# Patient Record
Sex: Female | Born: 1950
Health system: Southern US, Community
[De-identification: ages and names within clinical notes are randomized; demographics above are authoritative.]

## PROBLEM LIST (undated history)

## (undated) DIAGNOSIS — E785 Hyperlipidemia, unspecified: Secondary | ICD-10-CM

## (undated) DIAGNOSIS — I10 Essential (primary) hypertension: Secondary | ICD-10-CM

## (undated) DIAGNOSIS — T7840XA Allergy, unspecified, initial encounter: Secondary | ICD-10-CM

## (undated) DIAGNOSIS — M1990 Unspecified osteoarthritis, unspecified site: Secondary | ICD-10-CM

## (undated) DIAGNOSIS — K219 Gastro-esophageal reflux disease without esophagitis: Secondary | ICD-10-CM

## (undated) HISTORY — DX: Essential (primary) hypertension: I10

## (undated) HISTORY — PX: JOINT REPLACEMENT: SHX530

## (undated) HISTORY — DX: Hyperlipidemia, unspecified: E78.5

## (undated) HISTORY — DX: Gastro-esophageal reflux disease without esophagitis: K21.9

## (undated) HISTORY — DX: Unspecified osteoarthritis, unspecified site: M19.90

## (undated) HISTORY — DX: Allergy, unspecified, initial encounter: T78.40XA

---

## 1997-10-11 ENCOUNTER — Ambulatory Visit (HOSPITAL_COMMUNITY): Admission: RE | Admit: 1997-10-11 | Discharge: 1997-10-11 | Payer: Self-pay | Admitting: *Deleted

## 1998-05-03 ENCOUNTER — Ambulatory Visit (HOSPITAL_COMMUNITY): Admission: RE | Admit: 1998-05-03 | Discharge: 1998-05-03 | Payer: Self-pay | Admitting: Orthopedic Surgery

## 1998-05-10 ENCOUNTER — Ambulatory Visit (HOSPITAL_COMMUNITY): Admission: RE | Admit: 1998-05-10 | Discharge: 1998-05-10 | Payer: Self-pay | Admitting: Orthopedic Surgery

## 1998-05-10 ENCOUNTER — Encounter: Payer: Self-pay | Admitting: Orthopedic Surgery

## 1999-03-14 LAB — HM COLONOSCOPY: HM Colonoscopy: NORMAL

## 2002-08-17 ENCOUNTER — Other Ambulatory Visit: Admission: RE | Admit: 2002-08-17 | Discharge: 2002-08-17 | Payer: Self-pay | Admitting: Family Medicine

## 2002-08-23 ENCOUNTER — Encounter: Admission: RE | Admit: 2002-08-23 | Discharge: 2002-08-23 | Payer: Self-pay | Admitting: Family Medicine

## 2002-08-23 ENCOUNTER — Encounter: Payer: Self-pay | Admitting: Family Medicine

## 2003-08-24 ENCOUNTER — Other Ambulatory Visit: Admission: RE | Admit: 2003-08-24 | Discharge: 2003-08-24 | Payer: Self-pay | Admitting: Family Medicine

## 2003-08-25 ENCOUNTER — Other Ambulatory Visit: Admission: RE | Admit: 2003-08-25 | Discharge: 2003-08-25 | Payer: Self-pay | Admitting: Obstetrics and Gynecology

## 2003-08-30 ENCOUNTER — Ambulatory Visit (HOSPITAL_COMMUNITY): Admission: RE | Admit: 2003-08-30 | Discharge: 2003-08-30 | Payer: Self-pay | Admitting: Family Medicine

## 2004-07-30 ENCOUNTER — Ambulatory Visit: Payer: Self-pay | Admitting: Family Medicine

## 2004-08-14 ENCOUNTER — Other Ambulatory Visit: Admission: RE | Admit: 2004-08-14 | Discharge: 2004-08-14 | Payer: Self-pay | Admitting: Family Medicine

## 2004-08-14 ENCOUNTER — Ambulatory Visit: Payer: Self-pay | Admitting: Family Medicine

## 2004-08-15 ENCOUNTER — Other Ambulatory Visit: Admission: RE | Admit: 2004-08-15 | Discharge: 2004-08-15 | Payer: Self-pay | Admitting: Family Medicine

## 2004-08-21 ENCOUNTER — Ambulatory Visit: Payer: Self-pay | Admitting: Family Medicine

## 2004-09-20 ENCOUNTER — Ambulatory Visit (HOSPITAL_COMMUNITY): Admission: RE | Admit: 2004-09-20 | Discharge: 2004-09-20 | Payer: Self-pay | Admitting: Family Medicine

## 2004-10-10 ENCOUNTER — Ambulatory Visit: Payer: Self-pay | Admitting: Family Medicine

## 2005-01-24 ENCOUNTER — Ambulatory Visit: Payer: Self-pay | Admitting: Family Medicine

## 2005-09-25 ENCOUNTER — Ambulatory Visit (HOSPITAL_COMMUNITY): Admission: RE | Admit: 2005-09-25 | Discharge: 2005-09-25 | Payer: Self-pay | Admitting: Family Medicine

## 2006-01-02 ENCOUNTER — Ambulatory Visit: Payer: Self-pay | Admitting: Internal Medicine

## 2006-05-19 ENCOUNTER — Ambulatory Visit: Payer: Self-pay | Admitting: Family Medicine

## 2006-05-19 LAB — CONVERTED CEMR LAB
ALT: 27 units/L (ref 0–40)
AST: 24 units/L (ref 0–37)
Albumin: 3.7 g/dL (ref 3.5–5.2)
BUN: 9 mg/dL (ref 6–23)
Basophils Absolute: 0 10*3/uL (ref 0.0–0.1)
Creatinine, Ser: 0.9 mg/dL (ref 0.4–1.2)
Eosinophil percent: 1.1 % (ref 0.0–5.0)
GFR calc non Af Amer: 69 mL/min
Glomerular Filtration Rate, Af Am: 84 mL/min/{1.73_m2}
HCT: 40.1 % (ref 36.0–46.0)
MCHC: 33.8 g/dL (ref 30.0–36.0)
Neutrophils Relative %: 61.5 % (ref 43.0–77.0)
Platelets: 261 10*3/uL (ref 150–400)
Potassium: 2.9 meq/L — ABNORMAL LOW (ref 3.5–5.1)
RBC: 4.57 M/uL (ref 3.87–5.11)
RDW: 11.5 % (ref 11.5–14.6)
Sodium: 139 meq/L (ref 135–145)
Total Bilirubin: 0.7 mg/dL (ref 0.3–1.2)
WBC: 8.8 10*3/uL (ref 4.5–10.5)

## 2006-08-13 ENCOUNTER — Ambulatory Visit: Payer: Self-pay | Admitting: Family Medicine

## 2006-10-03 ENCOUNTER — Ambulatory Visit (HOSPITAL_COMMUNITY): Admission: RE | Admit: 2006-10-03 | Discharge: 2006-10-03 | Payer: Self-pay | Admitting: Family Medicine

## 2007-08-13 ENCOUNTER — Telehealth (INDEPENDENT_AMBULATORY_CARE_PROVIDER_SITE_OTHER): Payer: Self-pay | Admitting: *Deleted

## 2007-12-17 ENCOUNTER — Ambulatory Visit: Payer: Self-pay | Admitting: Family Medicine

## 2007-12-17 DIAGNOSIS — I10 Essential (primary) hypertension: Secondary | ICD-10-CM | POA: Insufficient documentation

## 2007-12-21 LAB — CONVERTED CEMR LAB
BUN: 14 mg/dL (ref 6–23)
Calcium: 9.2 mg/dL (ref 8.4–10.5)
Chloride: 106 meq/L (ref 96–112)
Creatinine, Ser: 1.1 mg/dL (ref 0.4–1.2)
GFR calc Af Amer: 66 mL/min
GFR calc non Af Amer: 54 mL/min

## 2007-12-22 ENCOUNTER — Encounter (INDEPENDENT_AMBULATORY_CARE_PROVIDER_SITE_OTHER): Payer: Self-pay | Admitting: *Deleted

## 2008-02-08 ENCOUNTER — Telehealth (INDEPENDENT_AMBULATORY_CARE_PROVIDER_SITE_OTHER): Payer: Self-pay | Admitting: *Deleted

## 2008-02-08 ENCOUNTER — Ambulatory Visit (HOSPITAL_BASED_OUTPATIENT_CLINIC_OR_DEPARTMENT_OTHER): Admission: RE | Admit: 2008-02-08 | Discharge: 2008-02-08 | Payer: Self-pay | Admitting: Family Medicine

## 2008-02-08 ENCOUNTER — Ambulatory Visit: Payer: Self-pay | Admitting: Family Medicine

## 2008-02-08 DIAGNOSIS — R109 Unspecified abdominal pain: Secondary | ICD-10-CM | POA: Insufficient documentation

## 2008-02-08 IMAGING — US US ABDOMEN COMPLETE
1 series · 14 of 25 positions shown · non-contrast
Comparison: None

CLINICAL DATA: Abdominal pain

ABDOMEN ULTRASOUND
TECHNIQUE: Complete abdominal ultrasound examination was performed
including evaluation of the liver, gallbladder, bile ducts,
pancreas, kidneys, spleen, IVC, and abdominal aorta.

[Series 1: us abdomen complete · 0.33mm/px · 14 of 76 slices shown]
[im 1/76]
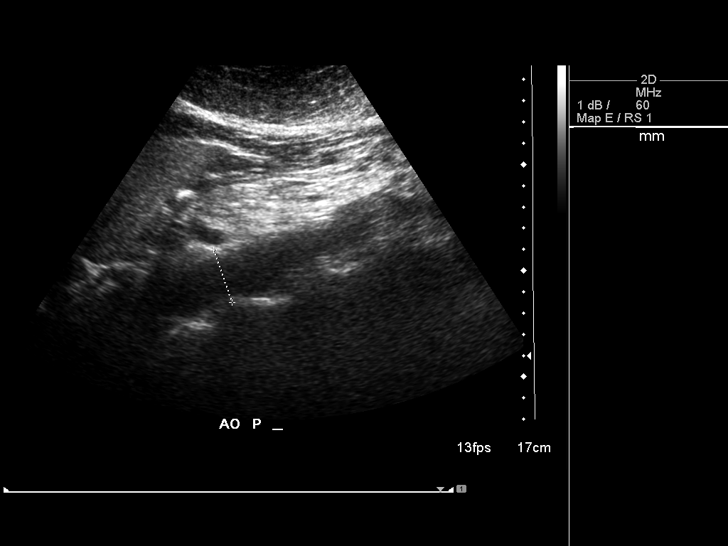
[im 7/76]
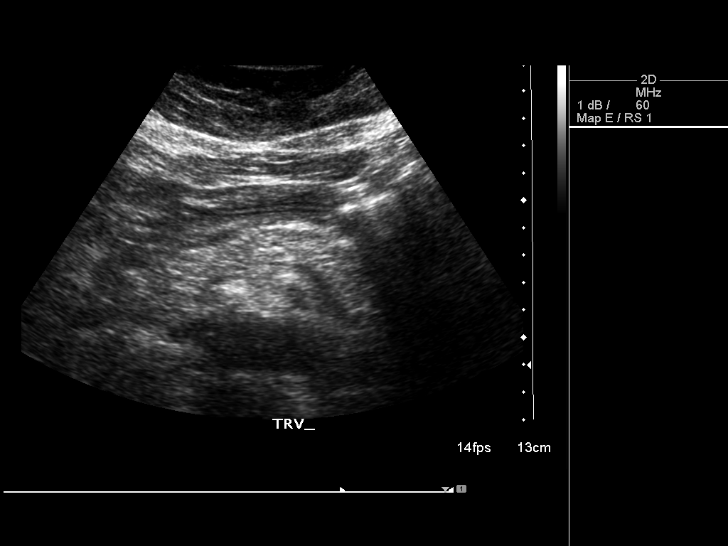
[im 13/76]
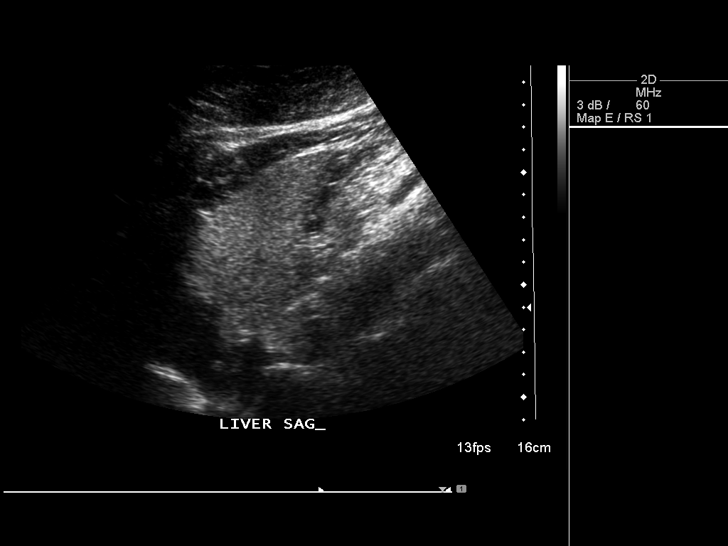
[im 19/76]
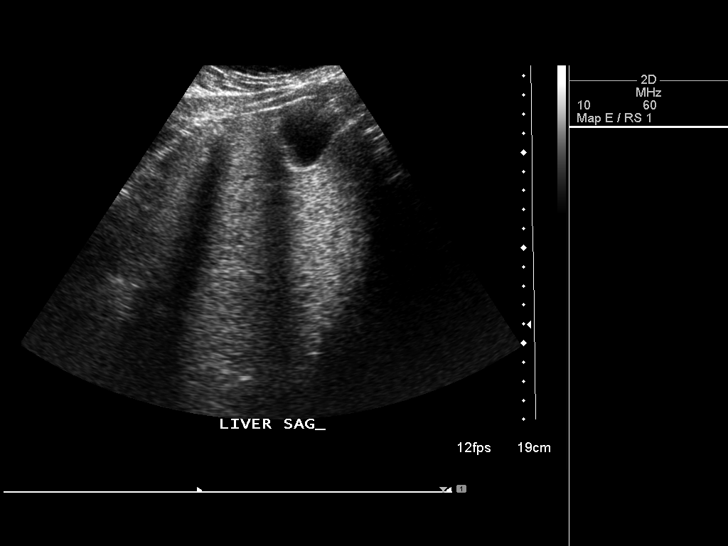
[im 26/76]
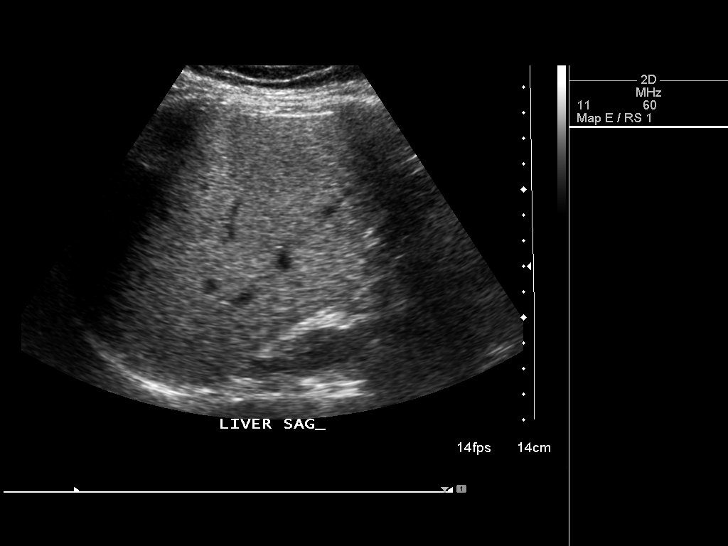
[im 29/76]
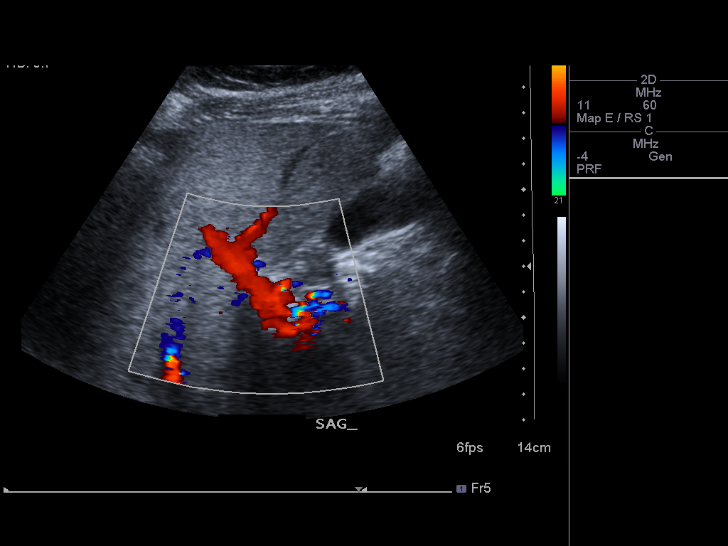
[im 35/76]
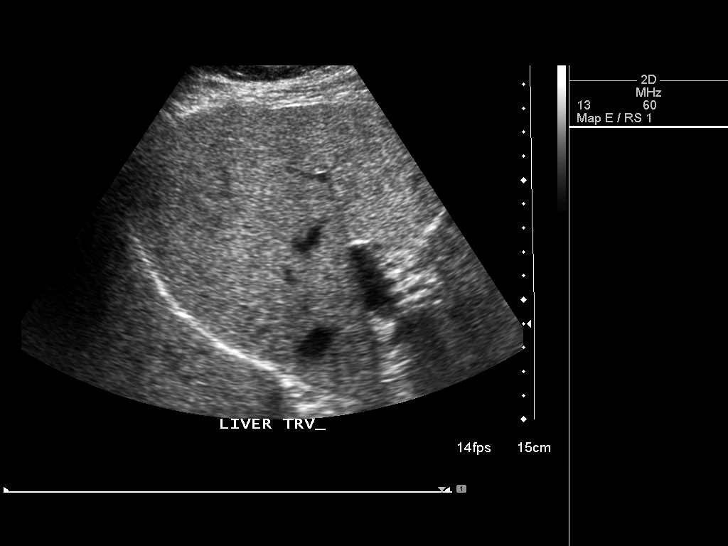
[im 41/76]
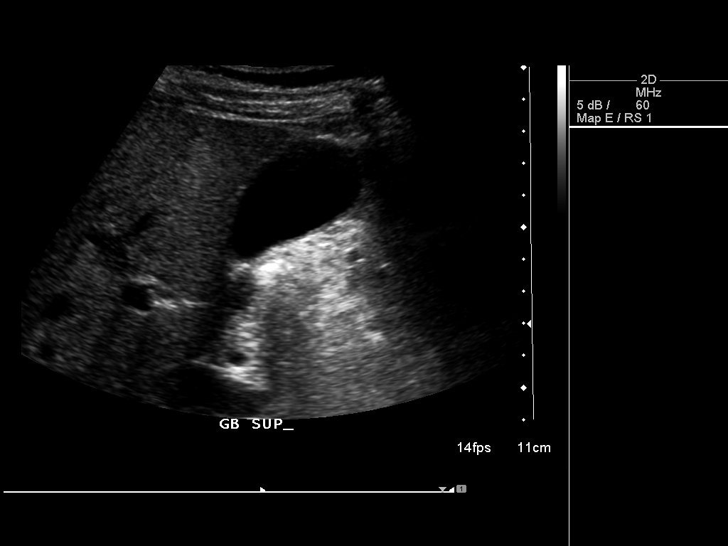
[im 47/76]
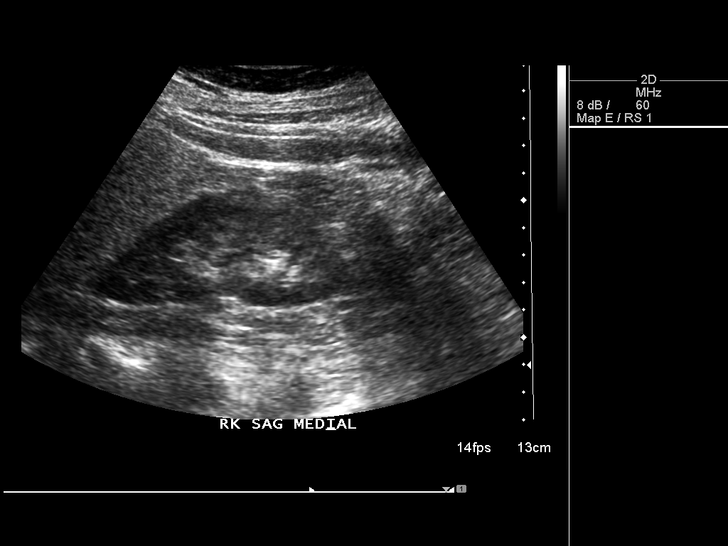
[im 51/76]
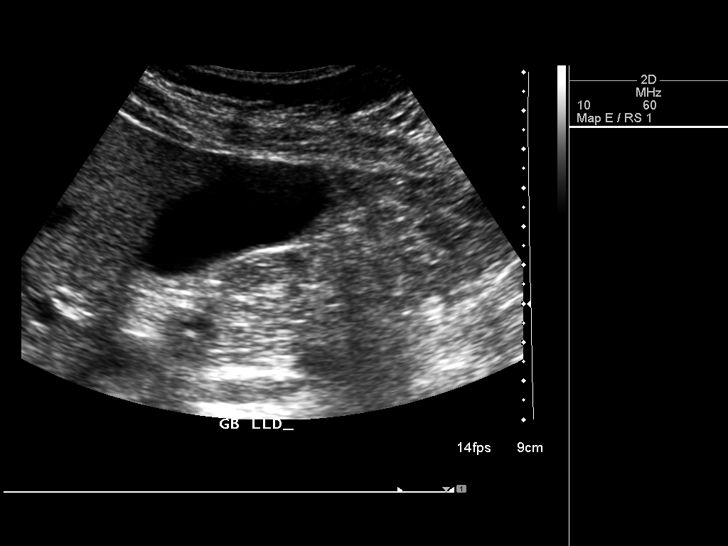
[im 57/76]
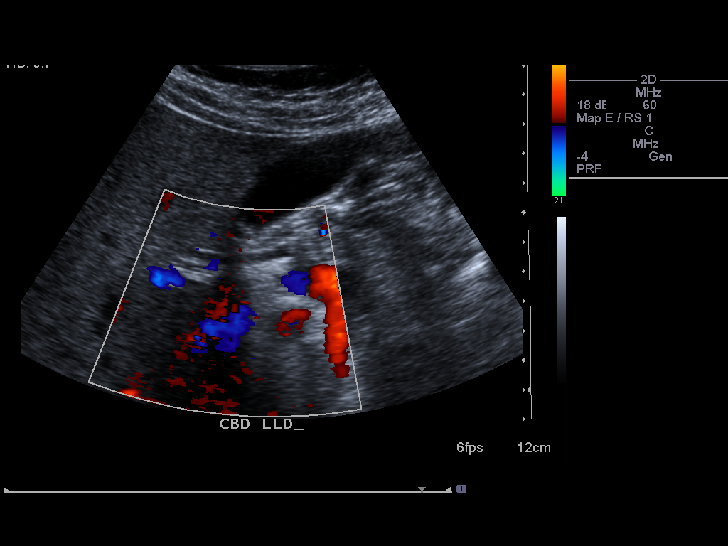
[im 63/76]
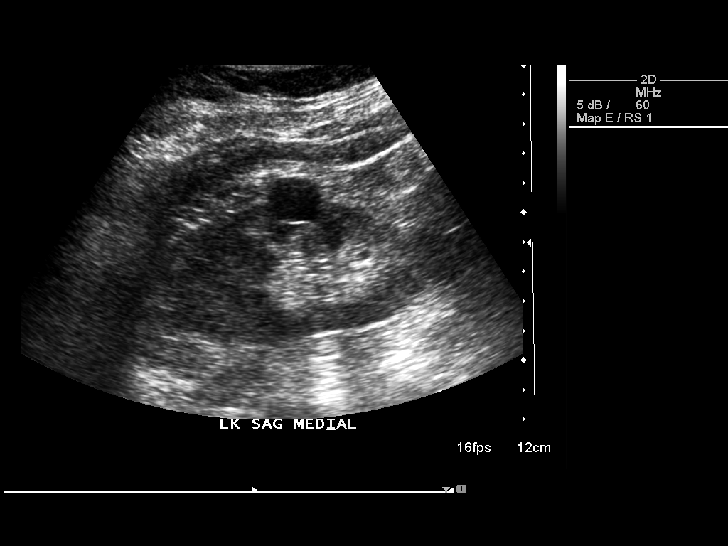
[im 69/76]
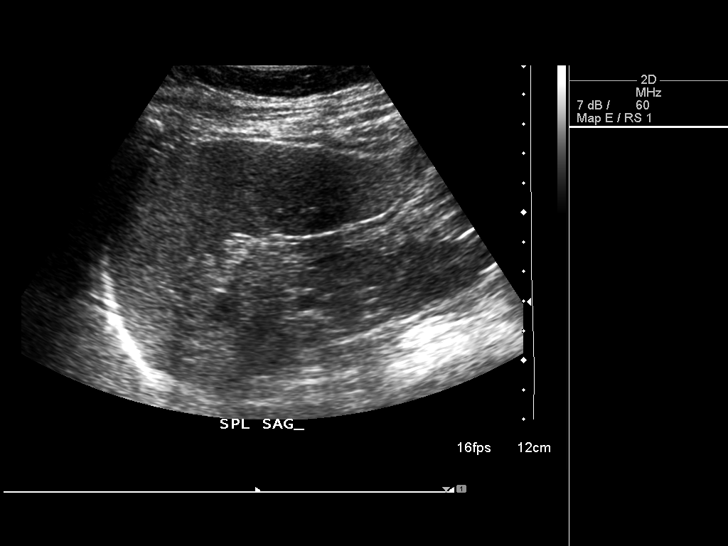
[im 76/76]
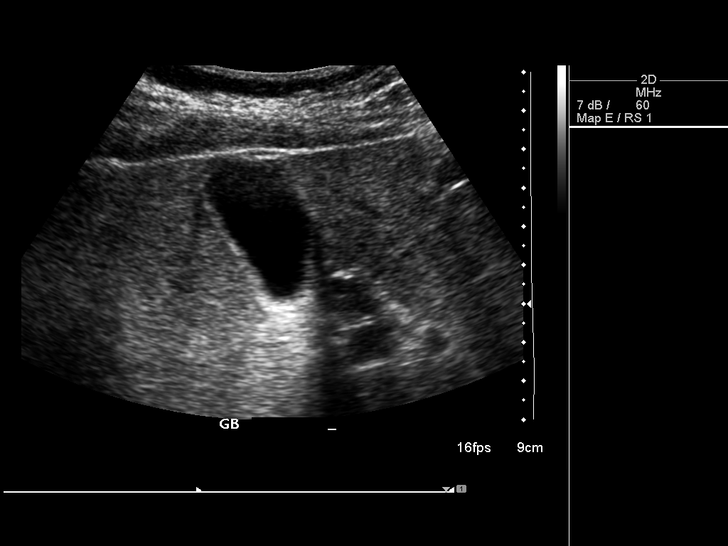

[14 of 25 positions shown; findings below may reference images not displayed]

FINDINGS: The liver demonstrates mild diffuse increased
echogenicity which may suggest fatty infiltration.  No focal
hepatic lesions or intrahepatic ductal dilatation.  The common bile
duct is normal measuring 2.8 mm.  Gallbladder is unremarkable.

The pancreas is fairly well visualized and demonstrates no
sonographic abnormalities.  The IVC and aorta are normal in
caliber.

The spleen is normal in size and demonstrates normal echogenicity.
The right kidney measures 10.5 cm and the left kidney measures
cm.  There is a small, 1.7 x 1.6 x 2.0 cm cyst associated the left
kidney.
IMPRESSION: 1.  Unremarkable abdominal ultrasound examination.

## 2008-02-09 ENCOUNTER — Encounter (INDEPENDENT_AMBULATORY_CARE_PROVIDER_SITE_OTHER): Payer: Self-pay | Admitting: *Deleted

## 2008-03-15 ENCOUNTER — Ambulatory Visit: Payer: Self-pay | Admitting: Family Medicine

## 2008-03-15 ENCOUNTER — Encounter: Payer: Self-pay | Admitting: Family Medicine

## 2008-03-15 ENCOUNTER — Other Ambulatory Visit: Admission: RE | Admit: 2008-03-15 | Discharge: 2008-03-15 | Payer: Self-pay | Admitting: Family Medicine

## 2008-03-15 DIAGNOSIS — IMO0002 Reserved for concepts with insufficient information to code with codable children: Secondary | ICD-10-CM | POA: Insufficient documentation

## 2008-03-15 DIAGNOSIS — K219 Gastro-esophageal reflux disease without esophagitis: Secondary | ICD-10-CM | POA: Insufficient documentation

## 2008-03-17 ENCOUNTER — Encounter (INDEPENDENT_AMBULATORY_CARE_PROVIDER_SITE_OTHER): Payer: Self-pay | Admitting: *Deleted

## 2008-03-28 ENCOUNTER — Encounter (INDEPENDENT_AMBULATORY_CARE_PROVIDER_SITE_OTHER): Payer: Self-pay | Admitting: *Deleted

## 2008-03-28 LAB — CONVERTED CEMR LAB
ALT: 34 units/L (ref 0–35)
AST: 27 units/L (ref 0–37)
Albumin: 4.3 g/dL (ref 3.5–5.2)
Alkaline Phosphatase: 84 units/L (ref 39–117)
BUN: 16 mg/dL (ref 6–23)
Basophils Relative: 2.1 % (ref 0.0–3.0)
Chloride: 110 meq/L (ref 96–112)
Creatinine, Ser: 0.8 mg/dL (ref 0.4–1.2)
Eosinophils Relative: 2.4 % (ref 0.0–5.0)
Glucose, Bld: 87 mg/dL (ref 70–99)
HCT: 40.4 % (ref 36.0–46.0)
HDL: 63.2 mg/dL (ref >39.0)
Monocytes Relative: 7.1 % (ref 3.0–12.0)
Neutrophils Relative %: 53.3 % (ref 43.0–77.0)
Platelets: 181 10*3/uL (ref 150–400)
Potassium: 4.4 meq/L (ref 3.5–5.1)
RBC: 4.61 M/uL (ref 3.87–5.11)
Total CHOL/HDL Ratio: 3.6
Total Protein: 7 g/dL (ref 6.0–8.3)
Triglycerides: 92 mg/dL (ref 0–149)
WBC: 4.9 10*3/uL (ref 4.5–10.5)

## 2009-02-14 ENCOUNTER — Ambulatory Visit: Payer: Self-pay | Admitting: Diagnostic Radiology

## 2009-02-14 ENCOUNTER — Ambulatory Visit (HOSPITAL_BASED_OUTPATIENT_CLINIC_OR_DEPARTMENT_OTHER): Admission: RE | Admit: 2009-02-14 | Discharge: 2009-02-14 | Payer: Self-pay | Admitting: Family Medicine

## 2009-04-18 ENCOUNTER — Ambulatory Visit: Payer: Self-pay | Admitting: Family Medicine

## 2009-04-19 ENCOUNTER — Telehealth: Payer: Self-pay | Admitting: Family Medicine

## 2009-05-04 ENCOUNTER — Ambulatory Visit: Payer: Self-pay | Admitting: Family Medicine

## 2009-05-04 DIAGNOSIS — IMO0002 Reserved for concepts with insufficient information to code with codable children: Secondary | ICD-10-CM | POA: Insufficient documentation

## 2009-05-05 ENCOUNTER — Ambulatory Visit (HOSPITAL_BASED_OUTPATIENT_CLINIC_OR_DEPARTMENT_OTHER): Admission: RE | Admit: 2009-05-05 | Discharge: 2009-05-05 | Payer: Self-pay | Admitting: Family Medicine

## 2009-05-05 ENCOUNTER — Ambulatory Visit: Payer: Self-pay | Admitting: Interventional Radiology

## 2009-05-05 IMAGING — CR DG LUMBAR SPINE 2-3V
3 series · 3 of 3 positions shown · non-contrast
Comparison: None

CLINICAL DATA: Left hip pain

LUMBAR SPINE - 2-3 VIEW

[t l-spine a.p.]
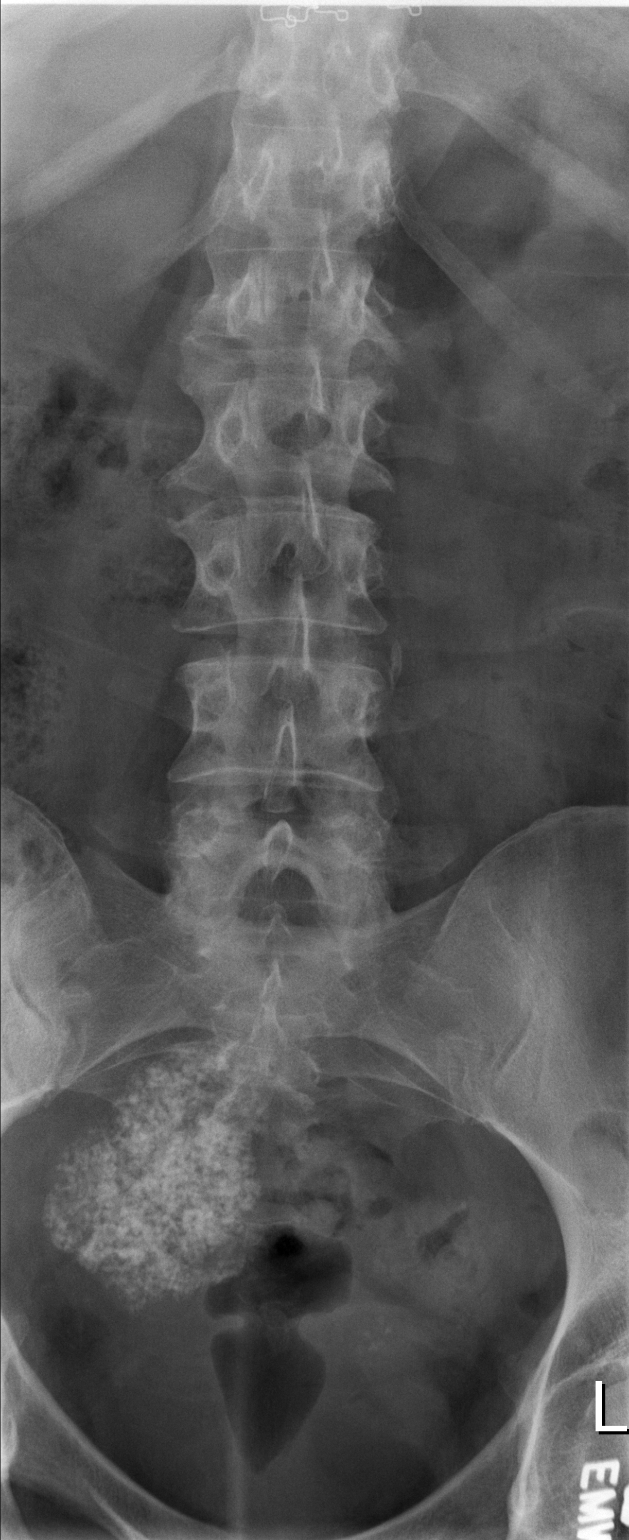

[t l-spine lat]
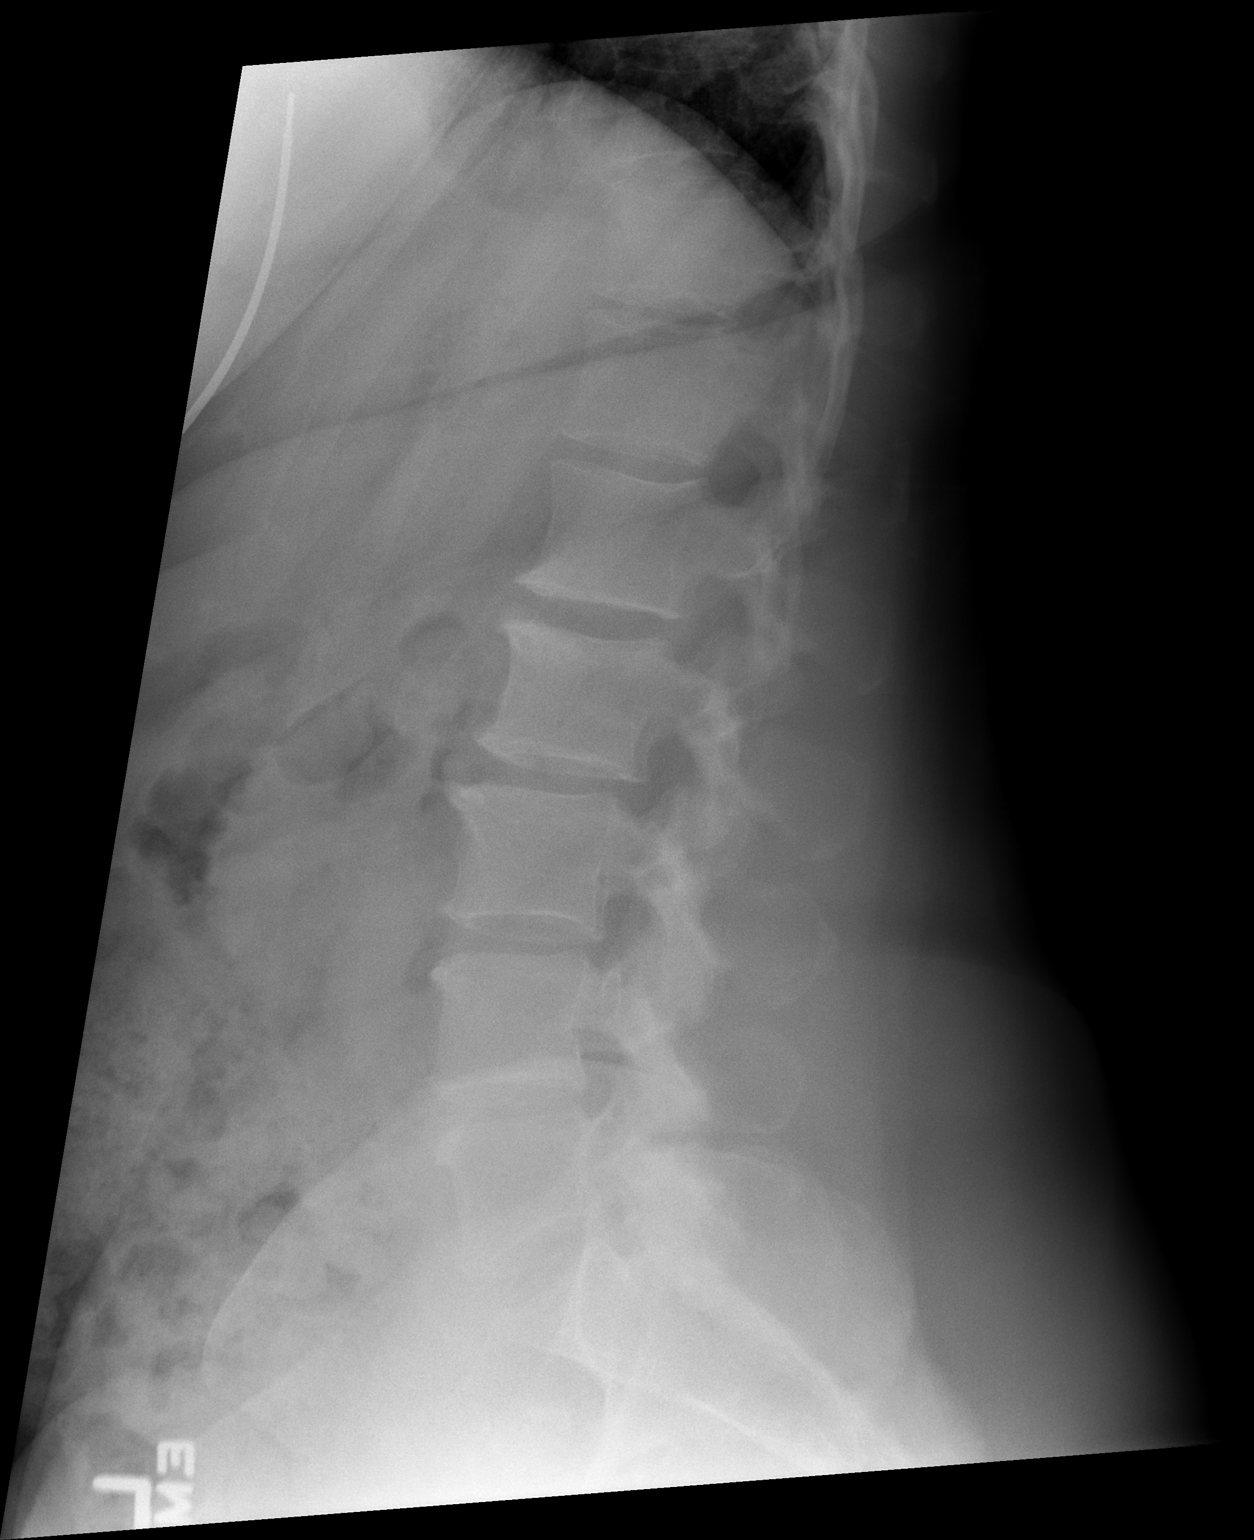

[t l-spine l5-s1 spot]
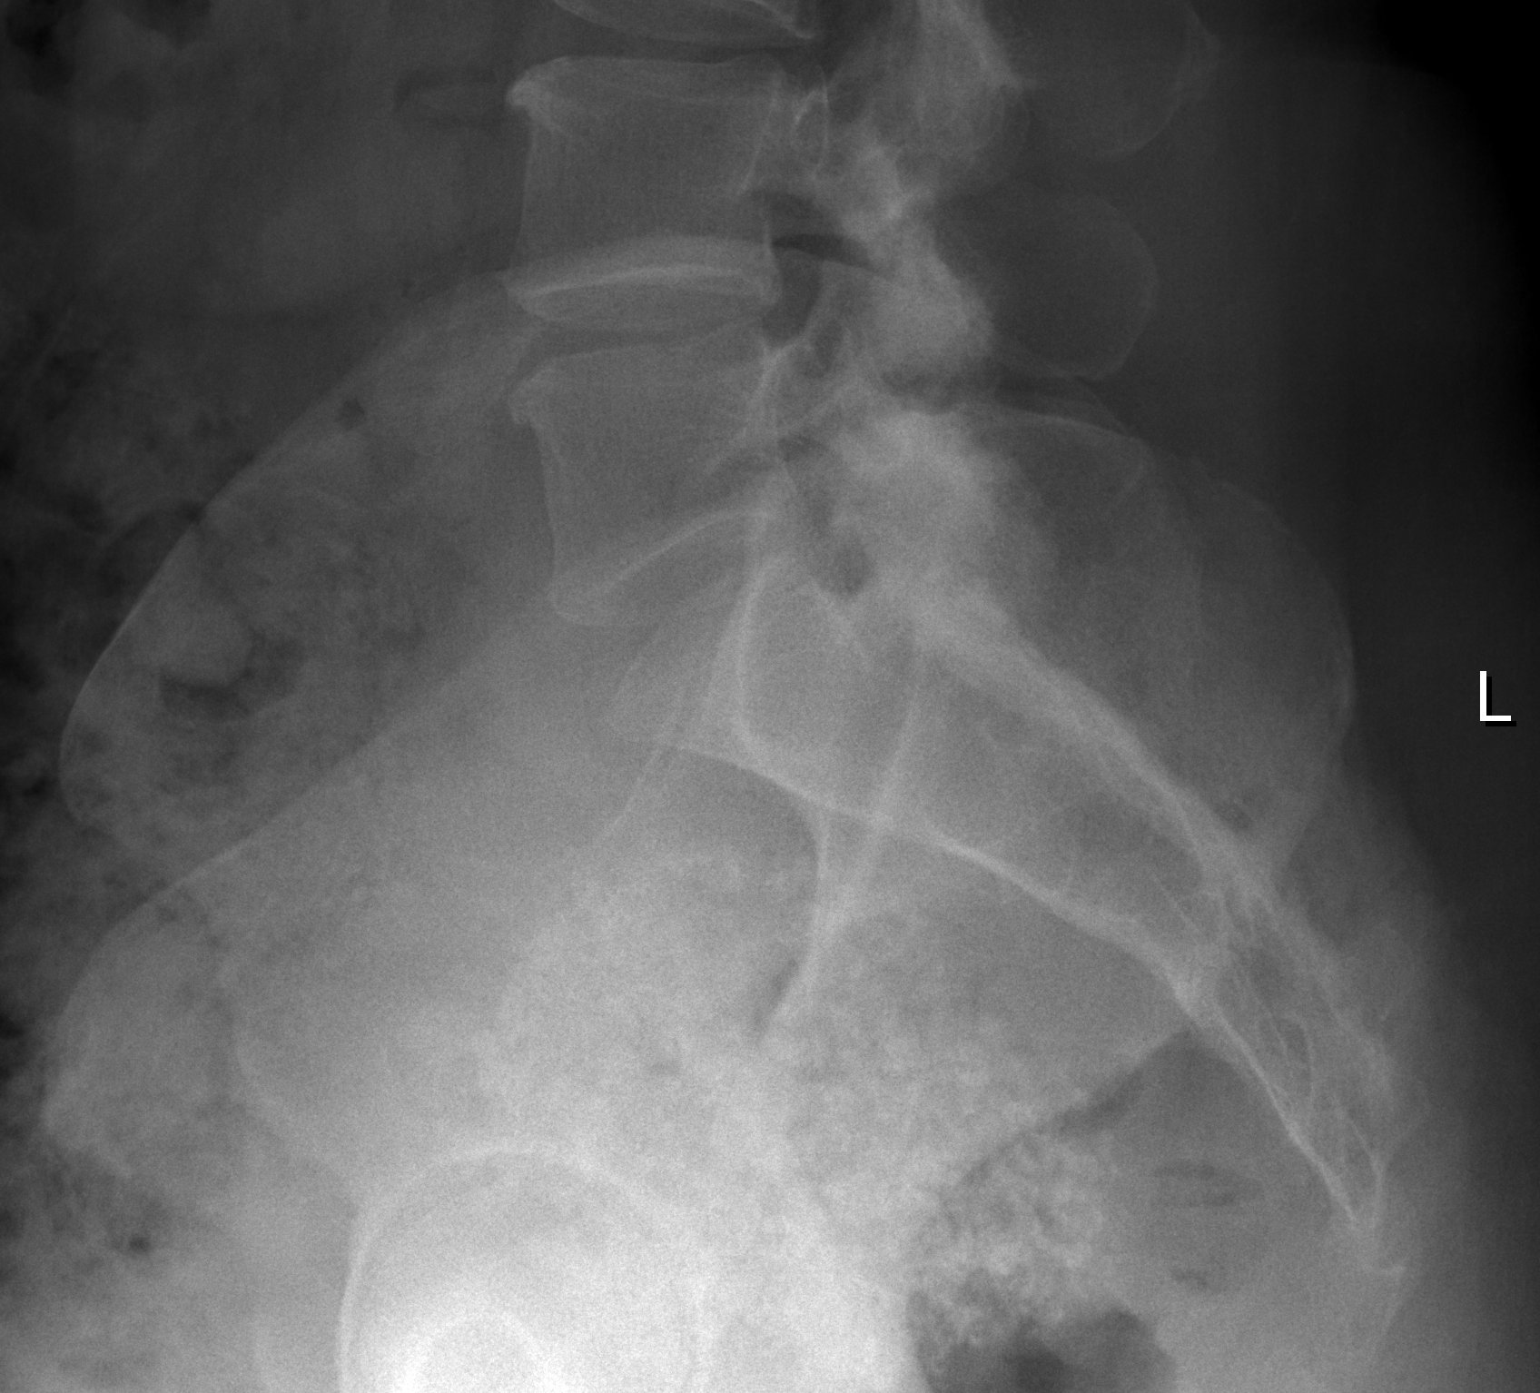

[3 of 3 positions shown; findings below may reference images not displayed]

FINDINGS: Normal alignment.  Negative for fracture.  Vertebral body
and intervertebral disc heights well maintained throughout.  Small
anterior plate spurs from L1-L5.  Calcified uterine fibroid noted.
IMPRESSION: 1.  Negative for fracture or other acute abnormality.

## 2009-05-05 IMAGING — CR DG HIP (WITH OR WITHOUT PELVIS) 2-3V*L*
3 series · 3 of 3 positions shown · non-contrast
Comparison: None

CLINICAL DATA: Hip pain

LEFT HIP - COMPLETE 2+ VIEW

[t pelvis a.p.]
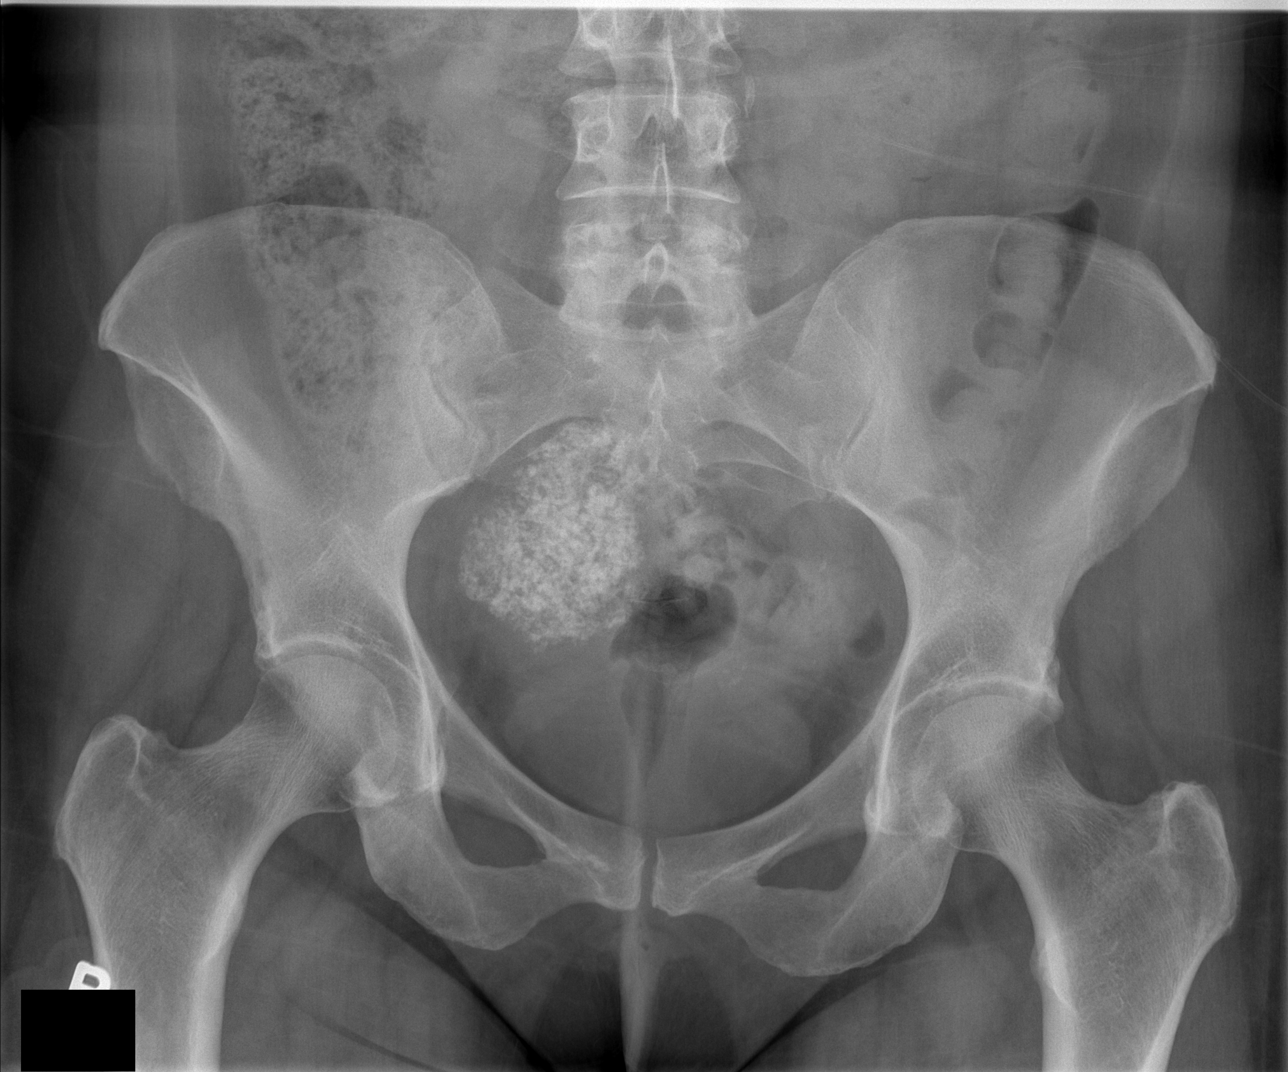

[t hip ap left]
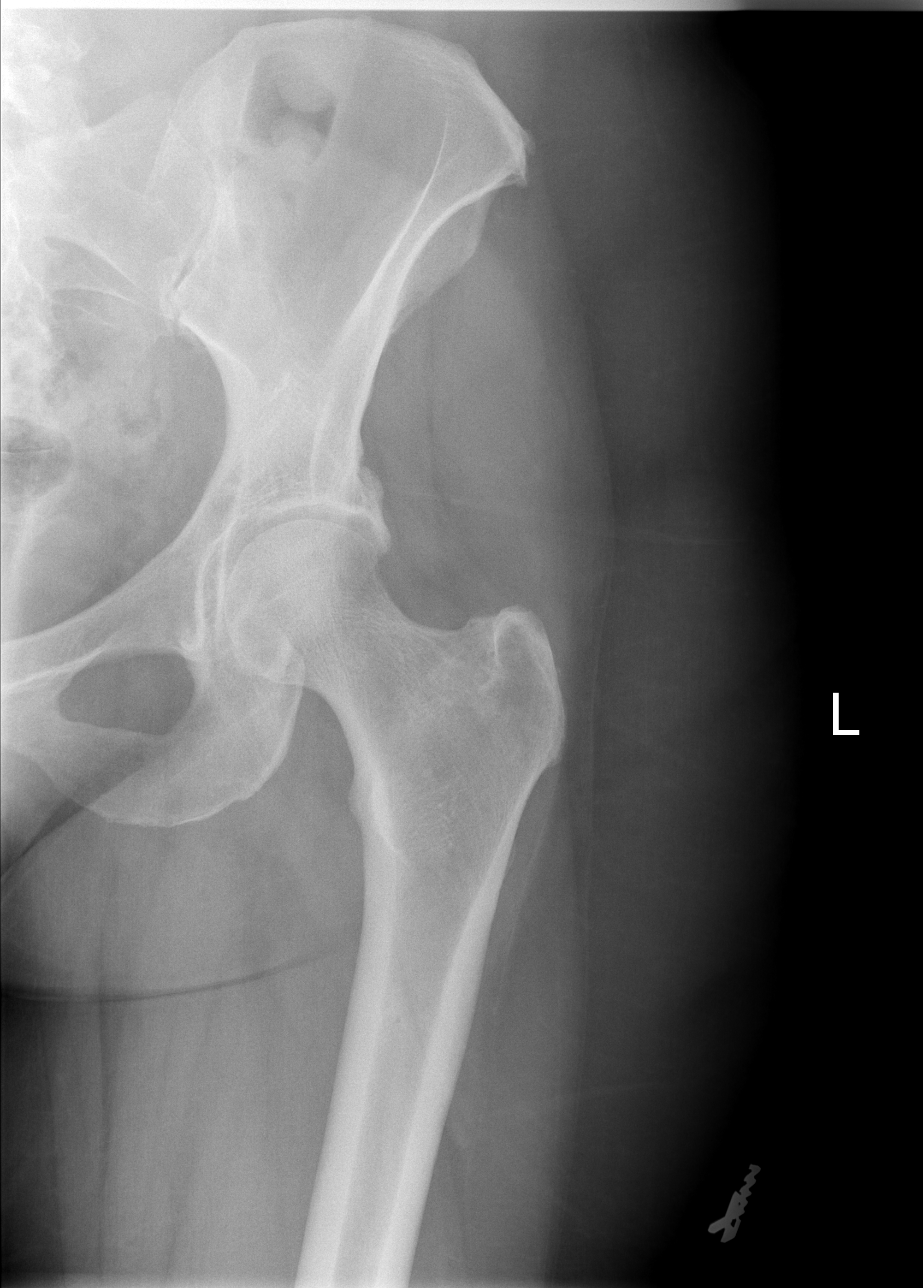

[t hip frog leg left]
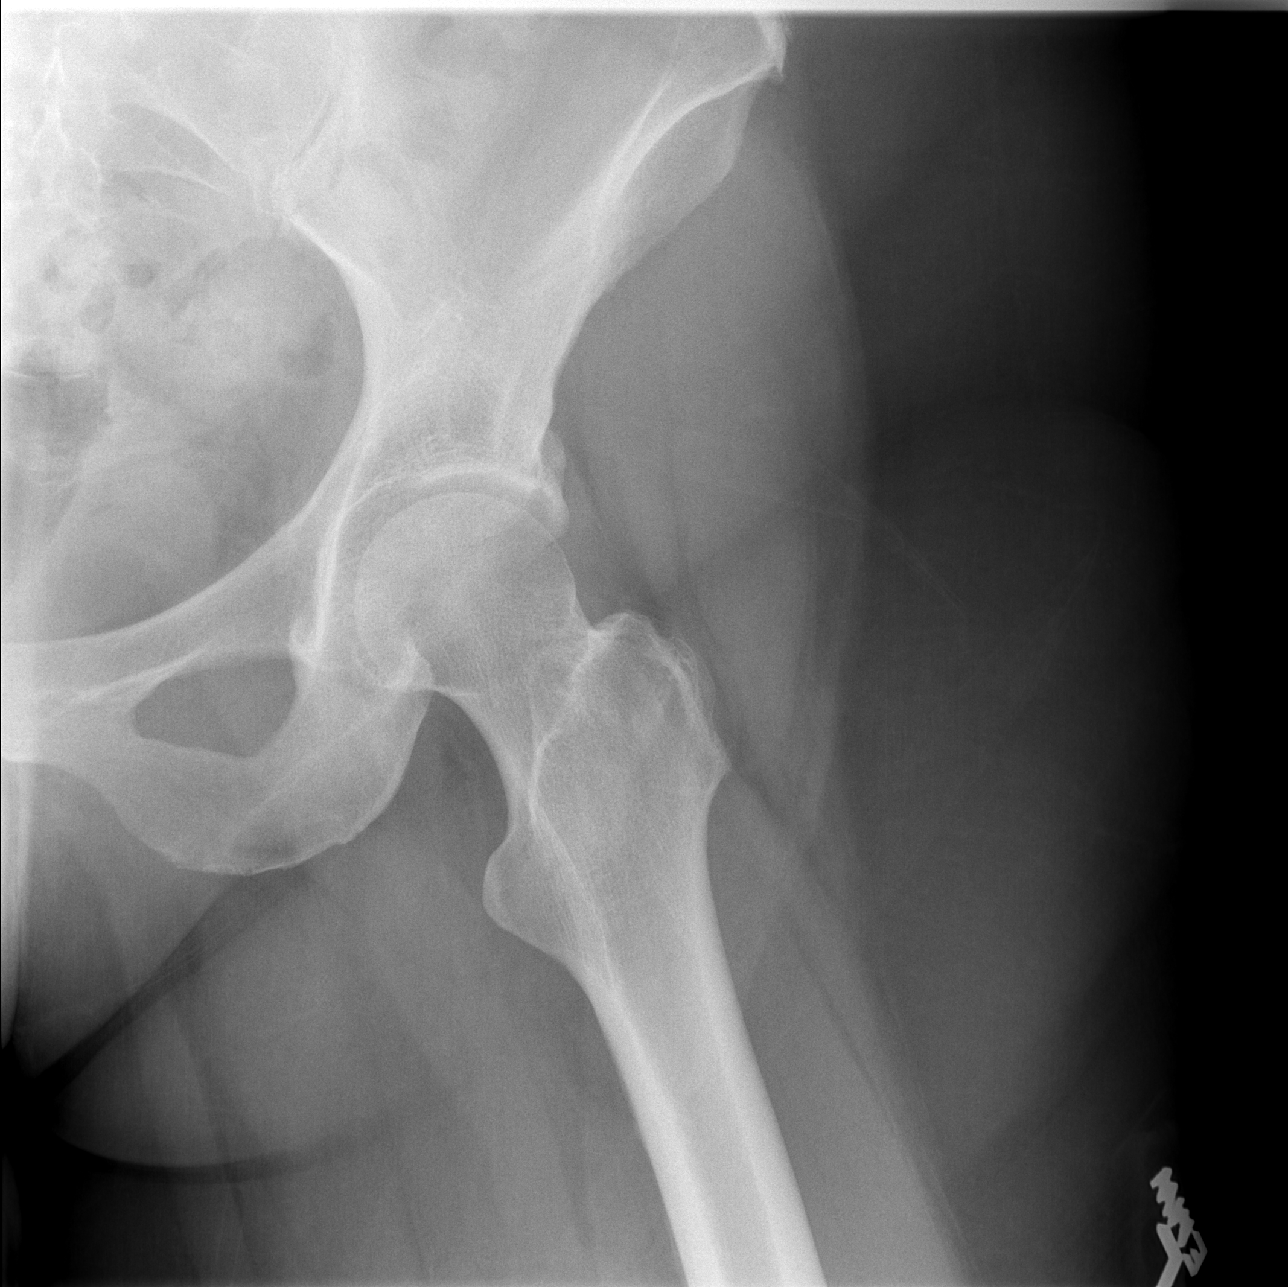

[3 of 3 positions shown; findings below may reference images not displayed]

FINDINGS: Calcified uterine fibroid. Negative for fracture,
dislocation, or other acute abnormality.  Normal alignment and
mineralization. No significant degenerative change.  Regional soft
tissues unremarkable.
IMPRESSION: Negative

## 2009-07-21 ENCOUNTER — Telehealth (INDEPENDENT_AMBULATORY_CARE_PROVIDER_SITE_OTHER): Payer: Self-pay | Admitting: *Deleted

## 2009-08-23 ENCOUNTER — Ambulatory Visit: Payer: Self-pay | Admitting: Family Medicine

## 2009-08-23 LAB — CONVERTED CEMR LAB
Bilirubin, Direct: 0 mg/dL (ref 0.0–0.3)
LDL Cholesterol: 58 mg/dL (ref 0–99)
Total Bilirubin: 0.5 mg/dL (ref 0.3–1.2)
Total CHOL/HDL Ratio: 2
VLDL: 31.4 mg/dL (ref 0.0–40.0)

## 2009-08-31 ENCOUNTER — Encounter (INDEPENDENT_AMBULATORY_CARE_PROVIDER_SITE_OTHER): Payer: Self-pay | Admitting: *Deleted

## 2009-11-29 HISTORY — PX: KNEE ARTHROSCOPY: SHX127

## 2010-02-23 ENCOUNTER — Ambulatory Visit: Payer: Self-pay | Admitting: Diagnostic Radiology

## 2010-02-23 ENCOUNTER — Ambulatory Visit (HOSPITAL_BASED_OUTPATIENT_CLINIC_OR_DEPARTMENT_OTHER): Admission: RE | Admit: 2010-02-23 | Discharge: 2010-02-23 | Payer: Self-pay | Admitting: Family Medicine

## 2010-02-23 LAB — HM MAMMOGRAPHY: HM Mammogram: NEGATIVE

## 2010-04-06 ENCOUNTER — Ambulatory Visit: Payer: Self-pay | Admitting: Family Medicine

## 2010-04-10 LAB — CONVERTED CEMR LAB
Albumin: 4.1 g/dL (ref 3.5–5.2)
HDL: 58 mg/dL (ref >39.00)
LDL Cholesterol: 103 mg/dL — ABNORMAL HIGH (ref 0–99)
Total CHOL/HDL Ratio: 3
Triglycerides: 156 mg/dL — ABNORMAL HIGH (ref 0.0–149.0)

## 2010-07-22 ENCOUNTER — Encounter: Payer: Self-pay | Admitting: Family Medicine

## 2010-07-29 LAB — CONVERTED CEMR LAB
ALT: 30 units/L (ref 0–35)
AST: 26 units/L (ref 0–37)
Albumin: 4.2 g/dL (ref 3.5–5.2)
Alkaline Phosphatase: 90 units/L (ref 39–117)
Alkaline Phosphatase: 90 units/L (ref 39–117)
BUN: 13 mg/dL (ref 6–23)
Basophils Relative: 1.2 % (ref 0.0–3.0)
Bilirubin, Direct: 0 mg/dL (ref 0.0–0.3)
Bilirubin, Direct: 0.1 mg/dL (ref 0.0–0.3)
CO2: 33 meq/L — ABNORMAL HIGH (ref 19–32)
Chloride: 102 meq/L (ref 96–112)
Chloride: 104 meq/L (ref 96–112)
Cholesterol: 219 mg/dL — ABNORMAL HIGH (ref 0–200)
Creatinine, Ser: 0.9 mg/dL (ref 0.4–1.2)
Eosinophils Relative: 1.3 % (ref 0.0–5.0)
Eosinophils Relative: 2 % (ref 0.0–5.0)
GFR calc Af Amer: 83 mL/min
Glucose, Bld: 109 mg/dL — ABNORMAL HIGH (ref 70–99)
HCT: 40.4 % (ref 36.0–46.0)
Lymphocytes Relative: 26.1 % (ref 12.0–46.0)
MCV: 85.9 fL (ref 78.0–100.0)
Monocytes Absolute: 0.4 10*3/uL (ref 0.1–1.0)
Monocytes Absolute: 0.6 10*3/uL (ref 0.1–1.0)
Monocytes Relative: 7.5 % (ref 3.0–12.0)
Neutrophils Relative %: 57.2 % (ref 43.0–77.0)
Neutrophils Relative %: 64.7 % (ref 43.0–77.0)
Nitrite: NEGATIVE
Platelets: 200 10*3/uL (ref 150–400)
Potassium: 3.6 meq/L (ref 3.5–5.1)
Potassium: 3.9 meq/L (ref 3.5–5.1)
Protein, U semiquant: NEGATIVE
RBC: 4.73 M/uL (ref 3.87–5.11)
RDW: 12 % (ref 11.5–14.6)
Sodium: 141 meq/L (ref 135–145)
Sodium: 142 meq/L (ref 135–145)
Total CHOL/HDL Ratio: 5
Total Protein: 6.9 g/dL (ref 6.0–8.3)
Total Protein: 7.1 g/dL (ref 6.0–8.3)
Urobilinogen, UA: NEGATIVE
VLDL: 43.8 mg/dL — ABNORMAL HIGH (ref 0.0–40.0)
WBC Urine, dipstick: NEGATIVE
WBC: 5.4 10*3/uL (ref 4.5–10.5)
WBC: 7.7 10*3/uL (ref 4.5–10.5)

## 2010-08-02 NOTE — Letter (Signed)
Summary: [facility]  [facility]   Imported By: Lanelle Bal 04/12/2010 13:39:48  _____________________________________________________________________  External Attachment:    Type:   Image     Comment:   External Document

## 2010-08-02 NOTE — Letter (Signed)
Summary: Primary Care Consult Scheduled Letter  [facility] [place]  [address] [place], [place] [address]   Phone: [phone]  Fax: [phone]      08/31/2009 MRN: [id number]  [facility] [address] [place], [place]  [address]    Dear Ms. Helgeson,    We have scheduled an appointment for you.  At the recommendation of Dr. Loreen Freud, we have scheduled you for a Screening Colonoscopy with [facility].  Your initial visit with the Pre-Visit nurse is on 09-19-2009 at 9:00am.  Your Screening Colonoscopy is on 10-03-2009 arrive no later than 8:00am with Dr. Lina Sar.  Their address is [address], [place] [address]. The office phone number is [phone].  If this appointment day and time is not convenient for you, please feel free to call the office of the doctor you are being referred to at the number listed above and reschedule the appointment.    It is important for you to keep your scheduled appointments. We are here to make sure you are given good patient care.   Thank you,    Renee, Patient Care Coordinator [place] [facility]

## 2010-08-02 NOTE — Progress Notes (Signed)
Summary: refill  Phone Note Refill Request Message from:  Fax from Pharmacy on [address] fax [phone]  simvastatin 40mg   Initial call taken by: Barb Merino,  July 21, 2009 9:23 AM    New/Updated Medications: ZOCOR 40 MG TABS (SIMVASTATIN) 1 by mouth once daily. [facility]. Prescriptions: ZOCOR 40 MG TABS (SIMVASTATIN) 1 by mouth once daily. [facility].  #30 x 0   Entered by:   Army Fossa CMA   Authorized by:   Loreen Freud DO   Signed by:   Army Fossa CMA on 07/21/2009   Method used:   [facility]        [facility] [phone]* (retail)       [address]       [place], [place]  [address]       Ph: [phone]       Fax: [phone]   RxID:   [id number]

## 2010-08-07 ENCOUNTER — Ambulatory Visit (INDEPENDENT_AMBULATORY_CARE_PROVIDER_SITE_OTHER): Payer: BC Managed Care – PPO | Admitting: Family Medicine

## 2010-08-07 ENCOUNTER — Encounter: Payer: Self-pay | Admitting: Family Medicine

## 2010-08-07 DIAGNOSIS — J209 Acute bronchitis, unspecified: Secondary | ICD-10-CM

## 2010-08-08 ENCOUNTER — Encounter: Payer: Self-pay | Admitting: Family Medicine

## 2010-08-16 NOTE — Assessment & Plan Note (Signed)
Summary: cough, cold, crud///sph  Medications Added AZITHROMYCIN 250 MG  TABS (AZITHROMYCIN) 2 by  mouth today and then 1 daily for 4 days TESSALON 200 MG CAPS (BENZONATATE) Take one capsule by mouth three times a day as needed for cough      Allergies Added: NKDA Nurse Visit   Vital Signs:  Patient profile:   60 year old female Weight:      205 pounds BMI:     35.32 Temp:     99.1 degrees F oral BP sitting:   124 / 80  (left arm)  Vitals Entered By: Doristine Devoid CMA (August 07, 2010 9:22 AM)  History of Present Illness: 60 yo woman here today for ? sinus infxn.  'i just got crud'.  sxs started last week w/ sinus congestion.  + HA, facial pain.  has hoarseness.  hx of recurrent bronchitis due to second hand smoke.  cough is nonproductive.  no fevers.  no known sick contacts.  no ear pain.   Patient Instructions: 1)  Take the Azithromycin for the sinus and bronchitis 2)  Use the tessalon as needed for cough 3)  Add mucinex to thin your congestion 4)  Drink plenty of fluids 5)  Tylenol or ibuprofen as needed for pain 6)  Rest! 7)  Hang in there!  CC: sinus congestion and cough hurts to cough x1wk    Current Medications (verified): 1)  Hydrochlorothiazide 25 Mg  Tabs (Hydrochlorothiazide) .Marland Kitchen.. 1 By Mouth Once Daily 2)  Zocor 40 Mg Tabs (Simvastatin) .Marland Kitchen.. 1 By Mouth Once Daily.  Allergies (verified): No Known Drug Allergies  Review of Systems      See HPI   Physical Exam  General:  Well-developed,well-nourished,in no acute distress; alert,appropriate and cooperative throughout examination Head:  NCAT, mild TTP over maxillary sinuses Eyes:  no injxn or inflammation Ears:  External ear exam shows no significant lesions or deformities.  Otoscopic examination reveals clear canals, tympanic membranes are intact bilaterally without bulging, retraction, inflammation or discharge. Hearing is grossly normal bilaterally. Nose:  External nasal examination shows no deformity  or inflammation. Nasal mucosa are pink and moist without lesions or exudates. Mouth:  Oral mucosa and oropharynx without lesions or exudates.  Teeth in good repair. Neck:  No deformities, masses, or tenderness noted. Lungs:  Normal respiratory effort, chest expands symmetrically. Lungs are clear to auscultation, no crackles or wheezes.  + hacking cough Heart:  normal rate and no murmur.   Cervical Nodes:  No lymphadenopathy noted    Impression & Recommendations:  Problem # 1:  BRONCHITIS- ACUTE (ICD-466.0) Assessment New pt's sxs and PE consistent w/ bronchitis.  possible early sinusitis but soft call.  start Zpack for bronchitis.  cough meds as needed.  reviewed supportive care and red flags that should prompt return.  Pt expresses understanding and is in agreement w/ this plan. Her updated medication list for this problem includes:    Azithromycin 250 Mg Tabs (Azithromycin) .Marland Kitchen... 2 by  mouth today and then 1 daily for 4 days    Tessalon 200 Mg Caps (Benzonatate) .Marland Kitchen... Take one capsule by mouth three times a day as needed for cough  Complete Medication List: 1)  Hydrochlorothiazide 25 Mg Tabs (Hydrochlorothiazide) .Marland Kitchen.. 1 by mouth once daily 2)  Zocor 40 Mg Tabs (Simvastatin) .Marland Kitchen.. 1 by mouth once daily. 3)  Azithromycin 250 Mg Tabs (Azithromycin) .... 2 by  mouth today and then 1 daily for 4 days 4)  Tessalon 200 Mg Caps (  Benzonatate) .... Take one capsule by mouth three times a day as needed for cough   Orders Added: 1)  Est. Patient Level III [99213] Prescriptions: HYDROCHLOROTHIAZIDE 25 MG  TABS (HYDROCHLOROTHIAZIDE) 1 by mouth once daily  #30 x 2   Entered and Authorized by:   Neena Rhymes MD   Signed by:   Neena Rhymes MD on 08/07/2010   Method used:   [facility]        [facility] [phone][facility] (retail)       [address]       [place], [place]  [address]       Ph: [phone]       Fax: [phone]   RxID:    [id number] TESSALON 200 MG CAPS (BENZONATATE) Take one capsule by mouth three times a day as needed for cough  #60 x 0   Entered and Authorized by:   Neena Rhymes MD   Signed by:   Neena Rhymes MD on 08/07/2010   Method used:   [facility]ronically to        [facility] [phone]* (retail)       [address]       [place], [place]  [address]       Ph: [phone]       Fax: [phone]   RxID:   [id number] AZITHROMYCIN 250 MG  TABS (AZITHROMYCIN) 2 by  mouth today and then 1 daily for 4 days  #6 x 0   Entered and Authorized by:   Neena Rhymes MD   Signed by:   Neena Rhymes MD on 08/07/2010   Method used:   Electronically to        [facility] [phone]* (retail)       [address]       [place], [place]  [address]       Ph: [phone]       Fax: [phone]   RxID:   [id number]

## 2010-09-11 ENCOUNTER — Encounter: Payer: Self-pay | Admitting: Family Medicine

## 2010-09-25 ENCOUNTER — Other Ambulatory Visit (HOSPITAL_COMMUNITY)
Admission: RE | Admit: 2010-09-25 | Discharge: 2010-09-25 | Disposition: A | Discharge status: Home / Self Care | Payer: BC Managed Care – PPO | Source: Ambulatory Visit | Attending: Family Medicine | Admitting: Family Medicine

## 2010-09-25 ENCOUNTER — Encounter: Payer: Self-pay | Admitting: Family Medicine

## 2010-09-25 ENCOUNTER — Ambulatory Visit (INDEPENDENT_AMBULATORY_CARE_PROVIDER_SITE_OTHER): Payer: BC Managed Care – PPO | Admitting: Family Medicine

## 2010-09-25 ENCOUNTER — Other Ambulatory Visit: Payer: Self-pay | Admitting: Family Medicine

## 2010-09-25 VITALS — BP 130/80 | HR 76 | Temp 97.9°F | Resp 16 | Ht 64.75 in | Wt 205.0 lb

## 2010-09-25 DIAGNOSIS — Z0000 Encounter for general adult medical examination without abnormal findings: Secondary | ICD-10-CM

## 2010-09-25 DIAGNOSIS — K219 Gastro-esophageal reflux disease without esophagitis: Secondary | ICD-10-CM

## 2010-09-25 DIAGNOSIS — I10 Essential (primary) hypertension: Secondary | ICD-10-CM

## 2010-09-25 DIAGNOSIS — Z01419 Encounter for gynecological examination (general) (routine) without abnormal findings: Secondary | ICD-10-CM | POA: Insufficient documentation

## 2010-09-25 DIAGNOSIS — E785 Hyperlipidemia, unspecified: Secondary | ICD-10-CM | POA: Insufficient documentation

## 2010-09-25 LAB — CBC WITH DIFFERENTIAL/PLATELET
Basophils Absolute: 0 10*3/uL (ref 0.0–0.1)
Basophils Relative: 0.5 % (ref 0.0–3.0)
Eosinophils Absolute: 0.1 10*3/uL (ref 0.0–0.7)
HCT: 39 % (ref 36.0–46.0)
Hemoglobin: 13.5 g/dL (ref 12.0–15.0)
Lymphocytes Relative: 31.2 % (ref 12.0–46.0)
Lymphs Abs: 1.9 10*3/uL (ref 0.7–4.0)
MCHC: 34.5 g/dL (ref 30.0–36.0)
MCV: 86.5 fl (ref 78.0–100.0)
Monocytes Absolute: 0.4 10*3/uL (ref 0.1–1.0)
Neutro Abs: 3.7 10*3/uL (ref 1.4–7.7)
RDW: 13.2 % (ref 11.5–14.6)

## 2010-09-25 LAB — POCT URINALYSIS DIPSTICK
Blood, UA: NEGATIVE
Glucose, UA: NEGATIVE
Nitrite, UA: NEGATIVE
Protein, UA: NEGATIVE
Urobilinogen, UA: NEGATIVE

## 2010-09-25 LAB — BASIC METABOLIC PANEL
Calcium: 9.3 mg/dL (ref 8.4–10.5)
Creatinine, Ser: 0.9 mg/dL (ref 0.4–1.2)
GFR: 64.61 mL/min (ref >60.00)
Glucose, Bld: 83 mg/dL (ref 70–99)
Sodium: 143 mEq/L (ref 135–145)

## 2010-09-25 LAB — LIPID PANEL
Cholesterol: 155 mg/dL (ref 0–200)
LDL Cholesterol: 73 mg/dL (ref 0–99)
Triglycerides: 120 mg/dL (ref 0.0–149.0)

## 2010-09-25 LAB — HEPATIC FUNCTION PANEL
ALT: 39 U/L — ABNORMAL HIGH (ref 0–35)
Albumin: 4.4 g/dL (ref 3.5–5.2)
Total Bilirubin: 0.8 mg/dL (ref 0.3–1.2)

## 2010-09-25 MED ORDER — HYDROCHLOROTHIAZIDE 25 MG PO TABS: 25.0000 mg | ORAL_TABLET | Freq: Every day | ORAL | Status: DC

## 2010-09-25 MED ORDER — SIMVASTATIN 40 MG PO TABS: 40.0000 mg | ORAL_TABLET | Freq: Every day | ORAL | Status: DC

## 2010-09-25 NOTE — Patient Instructions (Signed)
Things to do to Keep Yourself Healthy  Exercise at least 30 minutes a day, 5-7 days a week.  Eat a low-fat diet with lots of fruits and vegetables. Avoid obesity. Seatbelts can save your life. Wear them always. Smoke detectors on every level of your home, check batteries every year. Eye Doctor - have an eye exam regularly Safe sex - if you may be exposed to STDs, use a condom. Alcohol If you drink, do it moderately - 1 drink per day or less. Never drink and drive. Health Care Power of Attorney - choose one ... ask us for forms. Depression is common - If you're feeling down or losing interest in things you normally enjoy, please call us Violence - If anyone is threatening or hurting you, please call us 

## 2010-09-25 NOTE — Progress Notes (Signed)
  Subjective:     Heather Pierce is a 60 y.o. female and is here for a comprehensive physical exam. The patient reports no problems.  History   Social History  . Marital Status: Single    Spouse Name: N/A    Number of Children: N/A  . Years of Education: N/A   Occupational History  . 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 Smoking status: Never Smoker   . Smokeless tobacco: Not on file  . Alcohol Use: Yes  . Drug Use: No  . Sexually Active: No   Other Topics Concern  . Not on file   Social History Narrative  . No narrative on file   Health Maintenance  Topic Date Due  . Colonoscopy  03/13/2009  . Influenza Vaccine  03/31/2010  . Pap Smear  03/16/2011  . Mammogram  02/24/2012  . Tetanus/tdap  04/19/2019    The following portions of the patient's history were reviewed and updated as appropriate: allergies, current medications, past family history, past medical history, past social history, past surgical history and problem list.  Review of Systems A comprehensive review of systems was negative.  Patient reports no weight loss, fever problems walking, chest pain, frequent cough, hemoptysis, dyspnea, breast lumps, gastrointestinal bleeding, abdominal  masses, excessive heart burn, visual problems, hearing problems, adenopathy, prolonged hoarseness, syncope, focal weakness, memory loss, incontinence,  dysuria, hematuria, changing moles, skin ulcers, edema, depression, anxiety, vaginal sores or discharge.  Objective:    BP 130/80  Pulse 76  Temp(Src) 97.9 F (36.6 C) (Oral)  Resp 16  Ht 5' 4.75" (1.645 m)  Wt 205 lb (92.987 kg)  BMI 34.38 kg/m2  LMP 09/24/2005 General appearance: alert, cooperative, appears stated age and no distress Head: normal Eyes: conjunctivae/corneas clear. PERRL, EOM's intact. Fundi benign. Ears: normal TM's and external ear canals both ears Nose: Nares normal. Septum midline. Mucosa normal. No drainage or  sinus tenderness. Throat: lips, mucosa, and tongue normal; teeth and gums normal Neck: no adenopathy, no carotid bruit, no JVD, supple, symmetrical, trachea midline and thyroid not enlarged, symmetric, no tenderness/mass/nodules Lungs: clear to auscultation bilaterally Breasts: normal appearance, no masses or tenderness Heart: regular rate and rhythm, S1, S2 normal, no murmur, click, rub or gallop Abdomen: soft, non-tender; bowel sounds normal; no masses,  no organomegaly Pelvic: cervix normal in appearance, external genitalia normal, no adnexal masses or tenderness, no cervical motion tenderness, rectovaginal septum normal, uterus normal size, shape, and consistency and vagina normal without discharge Extremities: extremities normal, atraumatic, no cyanosis or edema Pulses:  L brachial 2+ R brachial 2+  L radial 2+ R radial 2+  L inguinal 2+ R inguinal 2+  L popliteal 2+ R popliteal 2+  L posterior tibial 2+ R posterior tibial 2+  L dorsalis pedis 2+ R dorsalis pedis 2+   Skin: Skin color, texture, turgor normal. No rashes or lesions Lymph nodes: Cervical, supraclavicular, and axillary nodes normal. Neurologic: Grossly normal    Assessment:    Healthy female exam.      Plan:     See After Visit Summary for Counseling Recommendations

## 2010-09-25 NOTE — Assessment & Plan Note (Signed)
Stable con't meds 

## 2010-09-25 NOTE — Assessment & Plan Note (Signed)
Stable On no daily meds Prevacid prn only

## 2010-09-25 NOTE — Assessment & Plan Note (Signed)
Check labs con't meds 

## 2010-09-25 NOTE — Progress Notes (Signed)
  Subjective:    Patient ID: Heather Pierce, female    DOB: 1951-01-08, 60 y.o.   MRN: [id number]  HPI    Review of Systems     Objective:   Physical Exam        Assessment & Plan:

## 2010-09-26 ENCOUNTER — Encounter: Payer: Self-pay | Admitting: *Deleted

## 2010-10-01 ENCOUNTER — Encounter: Payer: Self-pay | Admitting: *Deleted

## 2010-10-01 NOTE — Progress Notes (Signed)
Letter mailed

## 2010-10-04 ENCOUNTER — Encounter: Payer: Self-pay | Admitting: Family Medicine

## 2010-10-04 ENCOUNTER — Ambulatory Visit (INDEPENDENT_AMBULATORY_CARE_PROVIDER_SITE_OTHER): Payer: BC Managed Care – PPO | Admitting: Family Medicine

## 2010-10-04 VITALS — BP 142/82 | HR 72 | Temp 98.8°F | Wt 207.8 lb

## 2010-10-04 DIAGNOSIS — IMO0002 Reserved for concepts with insufficient information to code with codable children: Secondary | ICD-10-CM

## 2010-10-04 MED ORDER — HYDROCODONE-ACETAMINOPHEN 7.5-750 MG PO TABS: 1.0000 | ORAL_TABLET | Freq: Four times a day (QID) | ORAL | Status: DC | PRN

## 2010-10-04 MED ORDER — CYCLOBENZAPRINE HCL 10 MG PO TABS: 10.0000 mg | ORAL_TABLET | Freq: Three times a day (TID) | ORAL | Status: DC | PRN

## 2010-10-04 NOTE — Assessment & Plan Note (Signed)
Stretching Flexeril  vicodin Check xray if no better in 1-2 weeks or if worsens  Consider PT vs MRI if no improvement

## 2010-10-04 NOTE — Progress Notes (Signed)
  Subjective:    Patient ID: Heather Pierce, female    DOB: 1951-04-21, 60 y.o.   MRN: [id number]  HPI Pt here c/o L hip/buttock pain that started about 2 weeks ago but worsened over the weekend secondary to walking a lot.  No known injury.  No Low back pain.  Pain when severe will radiate down leg to ankle.  Pt states sometimes she feels like the leg is going to give out on her.       Review of Systems  All other systems reviewed and are negative.   as above     Objective:   Physical Exam  Constitutional: She is oriented to person, place, and time. She appears well-developed and well-nourished. No distress.  Musculoskeletal: Normal range of motion. She exhibits tenderness. She exhibits no edema.       + L hip and buttock pain - slr Good rom   Neurological: She is alert and oriented to person, place, and time. She has normal reflexes.  Psychiatric: She has a normal mood and affect.          Assessment & Plan:

## 2010-10-04 NOTE — Patient Instructions (Signed)
Back Exercises Back exercises help treat and prevent back injuries. The goal of back exercises is to increase the strength of your abdominal and back muscles and the flexibility of your back. These exercises should be started when you no longer have back pain. Back exercises include: 1. Pelvic Tilt - Lie on your back with your knees bent. Tilt your pelvis until the lower part of your back is against the floor. Hold this position 5-10 sec and repeat 5-10 times.  2. Knee to Chest - Pull first one knee up against your chest and hold for 20-30 seconds, repeat this with the other knee, and then both knees. This may be done with the other leg straight or bent, whichever feels better.  3. Sit-Ups or Curl-Ups - Bend your knees 90 degrees. Start with tilting your pelvis, and do a partial, slow sit-up, lifting your trunk only 30-45 degrees off the floor. Take at least 2-3 sec for each sit-up. Do not do sit-ups with your knees out straight. If partial sit-ups are difficult, simply do the above but with only tightening your abdominal muscles and holding it as directed.  4. Hip-Lift - Lie on your back with your knees flexed 90 degrees. Push down with your feet and shoulders as you raise your hips a couple inches off the floor; hold for 10 sec, repeat 5-10 times.  5. Back arches - Lie on your stomach, propping yourself up on bent elbows. Slowly press on your hands, causing an arch in your low back. Repeat 3-5 times. Any initial stiffness and discomfort should lessen with repetition over time.  6. Shoulder-Lifts - Lie face down with arms beside your body. Keep hips and torso pressed to floor as you slowly lift your head and shoulders off the floor.  Do not overdo your exercises, especially in the beginning. Exercises may cause you some mild back discomfort which lasts for a few minutes; however, if the pain is more severe, or lasts for more than 15 minutes, do not continue exercises until you see your caregiver.  Improvement with exercise therapy for back problems is slow.  See your caregivers for assistance with developing a proper back exercise program. Document Released: 07/25/2004 Document Re-Released: 09/13/2008 [facility] Patient Information 2011 [place], [place].Back Pain & Injury Your back pain is most likely caused by a strain of the muscles or ligaments supporting the spine. Back strains cause pain and trouble moving because of muscle spasms. They may take several weeks to heal. Usually they are better in days.  Treatment for back pain includes:  Rest - Get bed rest as needed over the next day or two. Use a firm mattress and lie on your side with your knees slightly bent. If you lie on your back, put a pillow under your knees.   Early movement - Back pain improves most rapidly if you remain active. It is much more stressful on the back to sit or stand in one place. Do not sit, drive or stand in one place for more than 30 minutes at a time. Take short walks on level surfaces as soon as pain allows.   Limit bending and lifting - Do not bend over or lift anything over 20 pounds until instructed otherwise. Lift by bending your knees. Use your leg muscles to help. Keep the load close to your body and avoid twisting. Do not reach or do overhead work.   Medicines - Medicine to reduce pain and inflammation are helpful. Muscle-relaxing drugs may be prescribed.  Therapy - Put ice packs on your back every few hours for the first 2-3 days after your injury or as instructed. After that ice or heat may be alternated to reduce pain and spasm. Back exercises and gentle massage may be of some benefit. You should be examined again if your back pain is not better in one week.  SEEK IMMEDIATE MEDICAL CARE IF:  You have pain that radiates from your back into your legs.   You develop new bowel or bladder control problems.   You have unusual weakness or numbness in your arms or legs.   You develop nausea or  vomiting.   You develop abdominal pain.   You feel faint.  Document Released: 06/17/2005 Document Re-Released: 03/26/2008 [facility] Patient Information 2011 [place], [place].

## 2010-10-10 ENCOUNTER — Other Ambulatory Visit: Payer: Self-pay | Admitting: Family Medicine

## 2010-10-12 ENCOUNTER — Ambulatory Visit (HOSPITAL_COMMUNITY): Admission: RE | Admit: 2010-10-12 | Payer: BC Managed Care – PPO | Source: Ambulatory Visit

## 2010-10-12 ENCOUNTER — Ambulatory Visit (HOSPITAL_BASED_OUTPATIENT_CLINIC_OR_DEPARTMENT_OTHER)
Admission: RE | Admit: 2010-10-12 | Discharge: 2010-10-12 | Disposition: A | Discharge status: Home / Self Care | Payer: BC Managed Care – PPO | Source: Ambulatory Visit | Attending: Family Medicine | Admitting: Family Medicine

## 2010-10-12 ENCOUNTER — Other Ambulatory Visit: Payer: Self-pay | Admitting: Family Medicine

## 2010-10-12 DIAGNOSIS — M5137 Other intervertebral disc degeneration, lumbosacral region: Secondary | ICD-10-CM | POA: Insufficient documentation

## 2010-10-12 DIAGNOSIS — M79609 Pain in unspecified limb: Secondary | ICD-10-CM | POA: Insufficient documentation

## 2010-10-12 DIAGNOSIS — R52 Pain, unspecified: Secondary | ICD-10-CM

## 2010-10-12 DIAGNOSIS — M51379 Other intervertebral disc degeneration, lumbosacral region without mention of lumbar back pain or lower extremity pain: Secondary | ICD-10-CM | POA: Insufficient documentation

## 2010-10-12 DIAGNOSIS — IMO0001 Reserved for inherently not codable concepts without codable children: Secondary | ICD-10-CM | POA: Insufficient documentation

## 2010-10-12 IMAGING — CR DG LUMBAR SPINE 2-3V
3 series · 3 of 3 positions shown · non-contrast
Comparison: [DATE]

CLINICAL DATA: Left buttock pain radiating down posterior left leg
to knee

LUMBAR SPINE - 2-3 VIEW

[t l-spine a.p.]
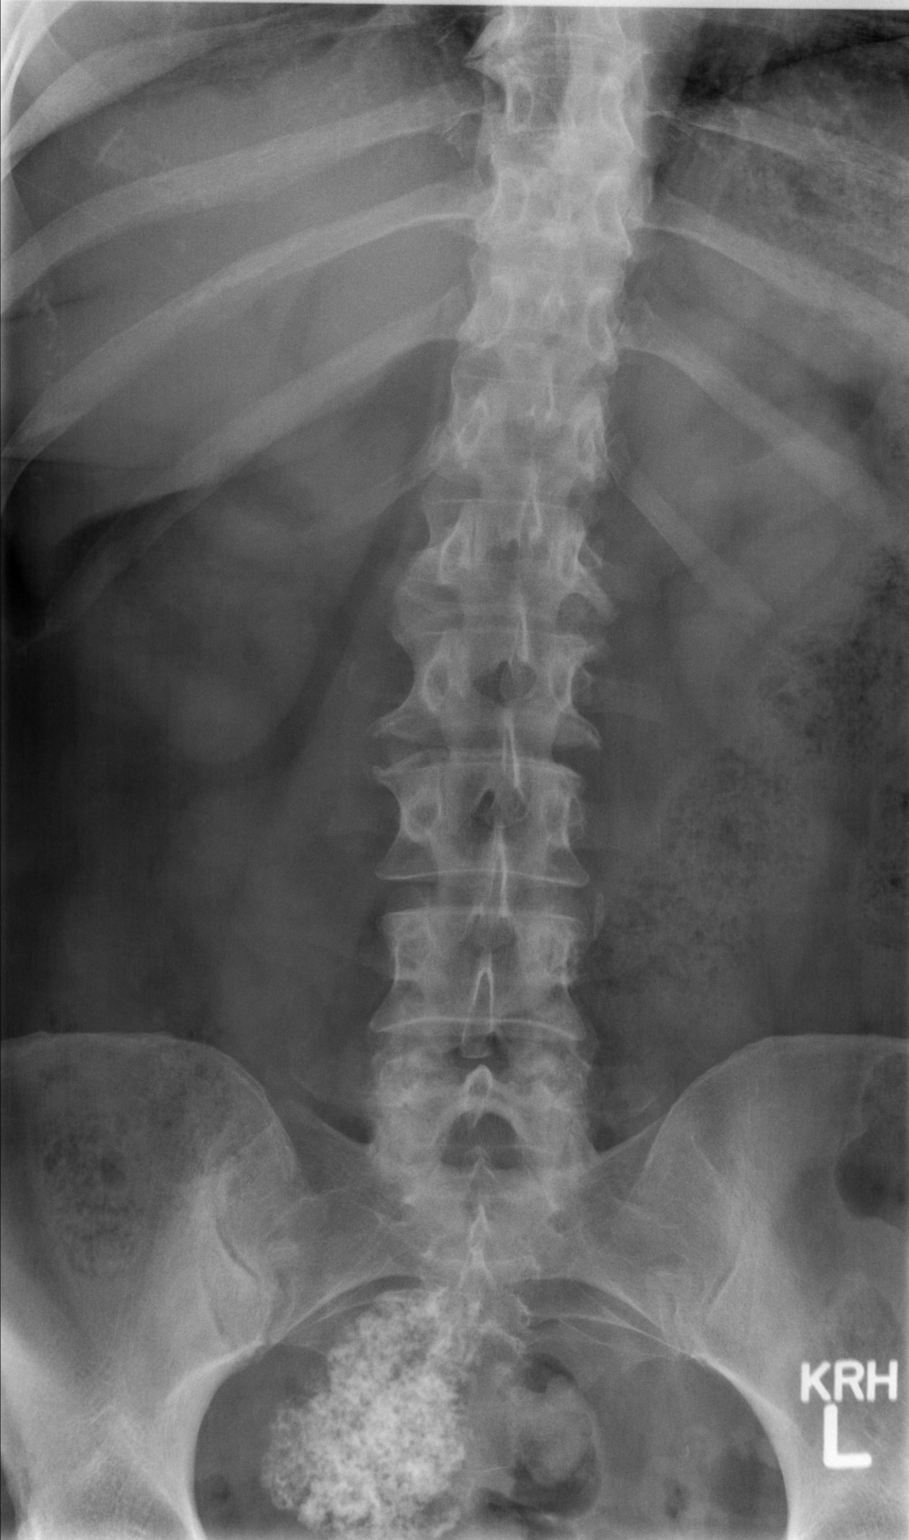

[t l-spine lat]
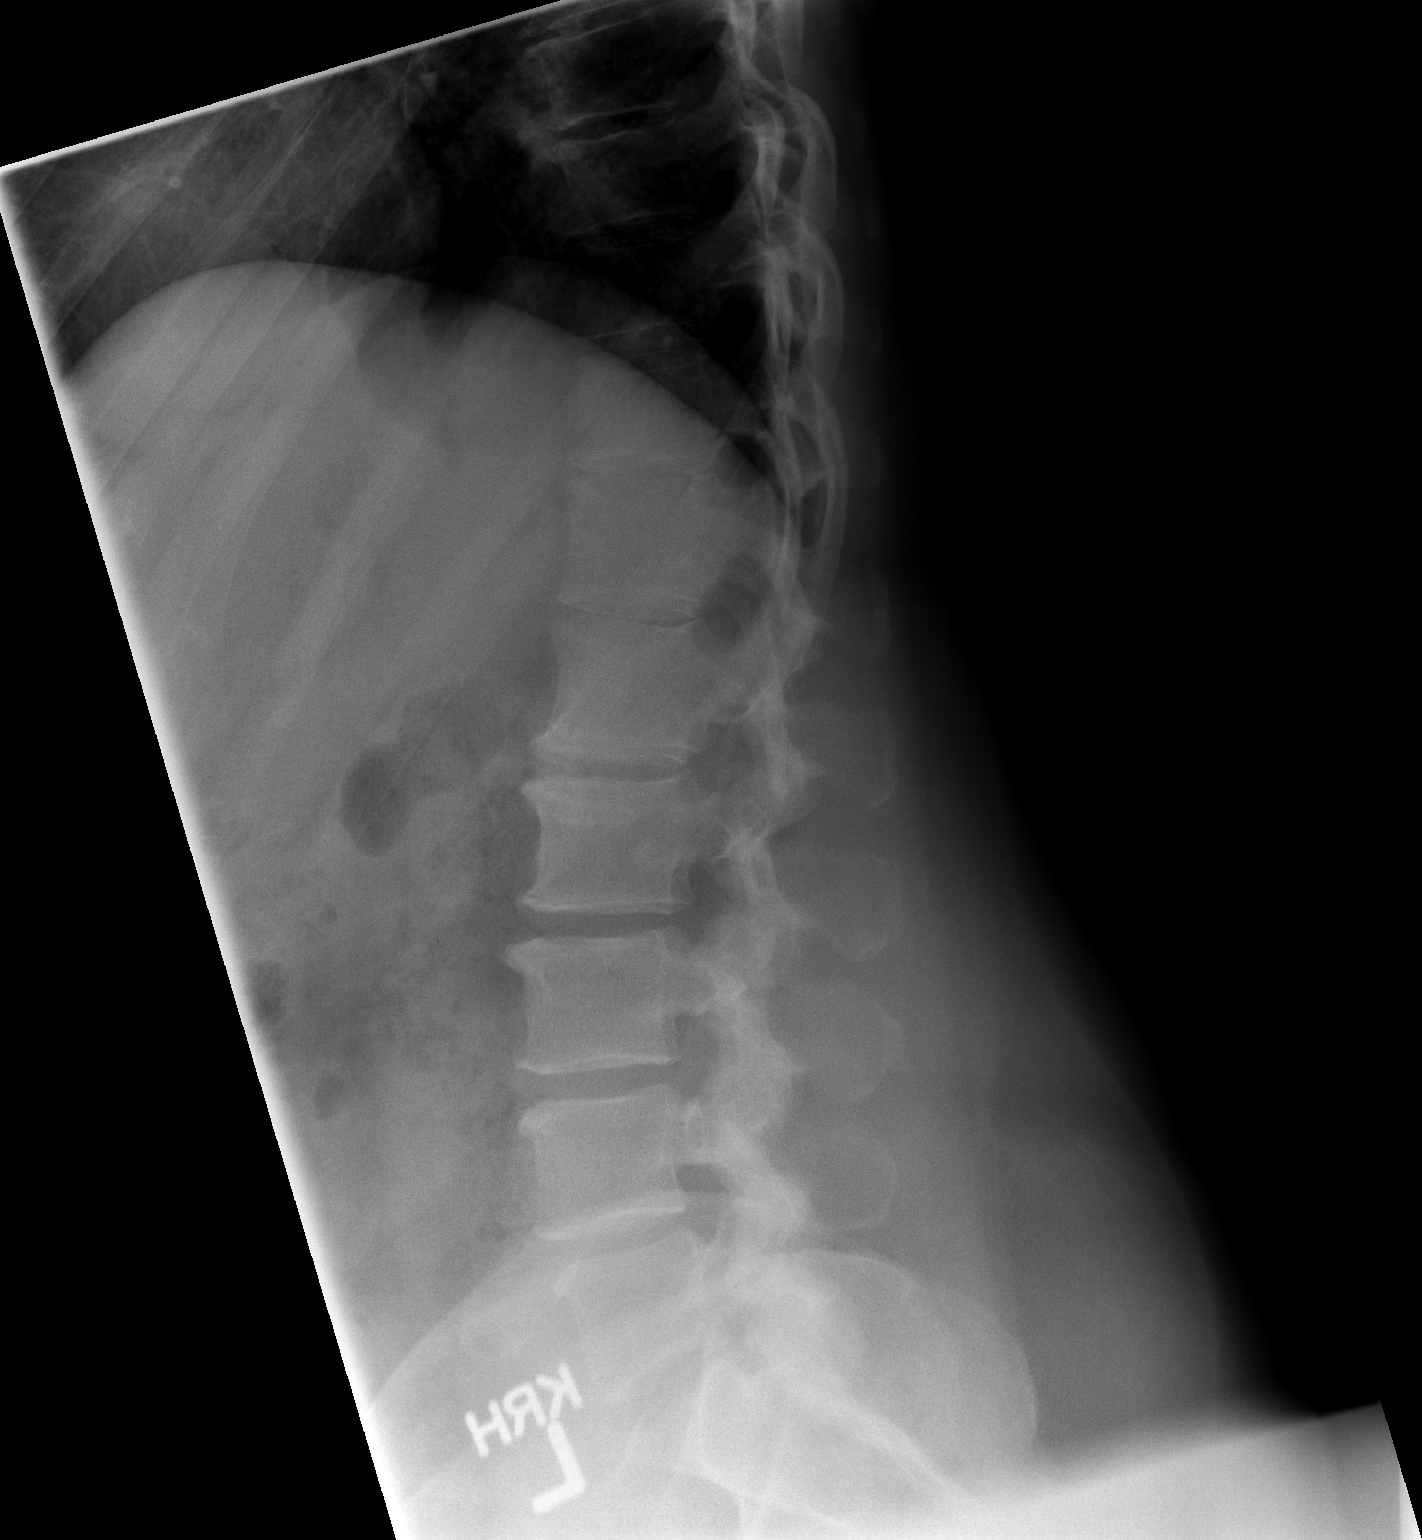

[t l-spine l5-s1 spot]
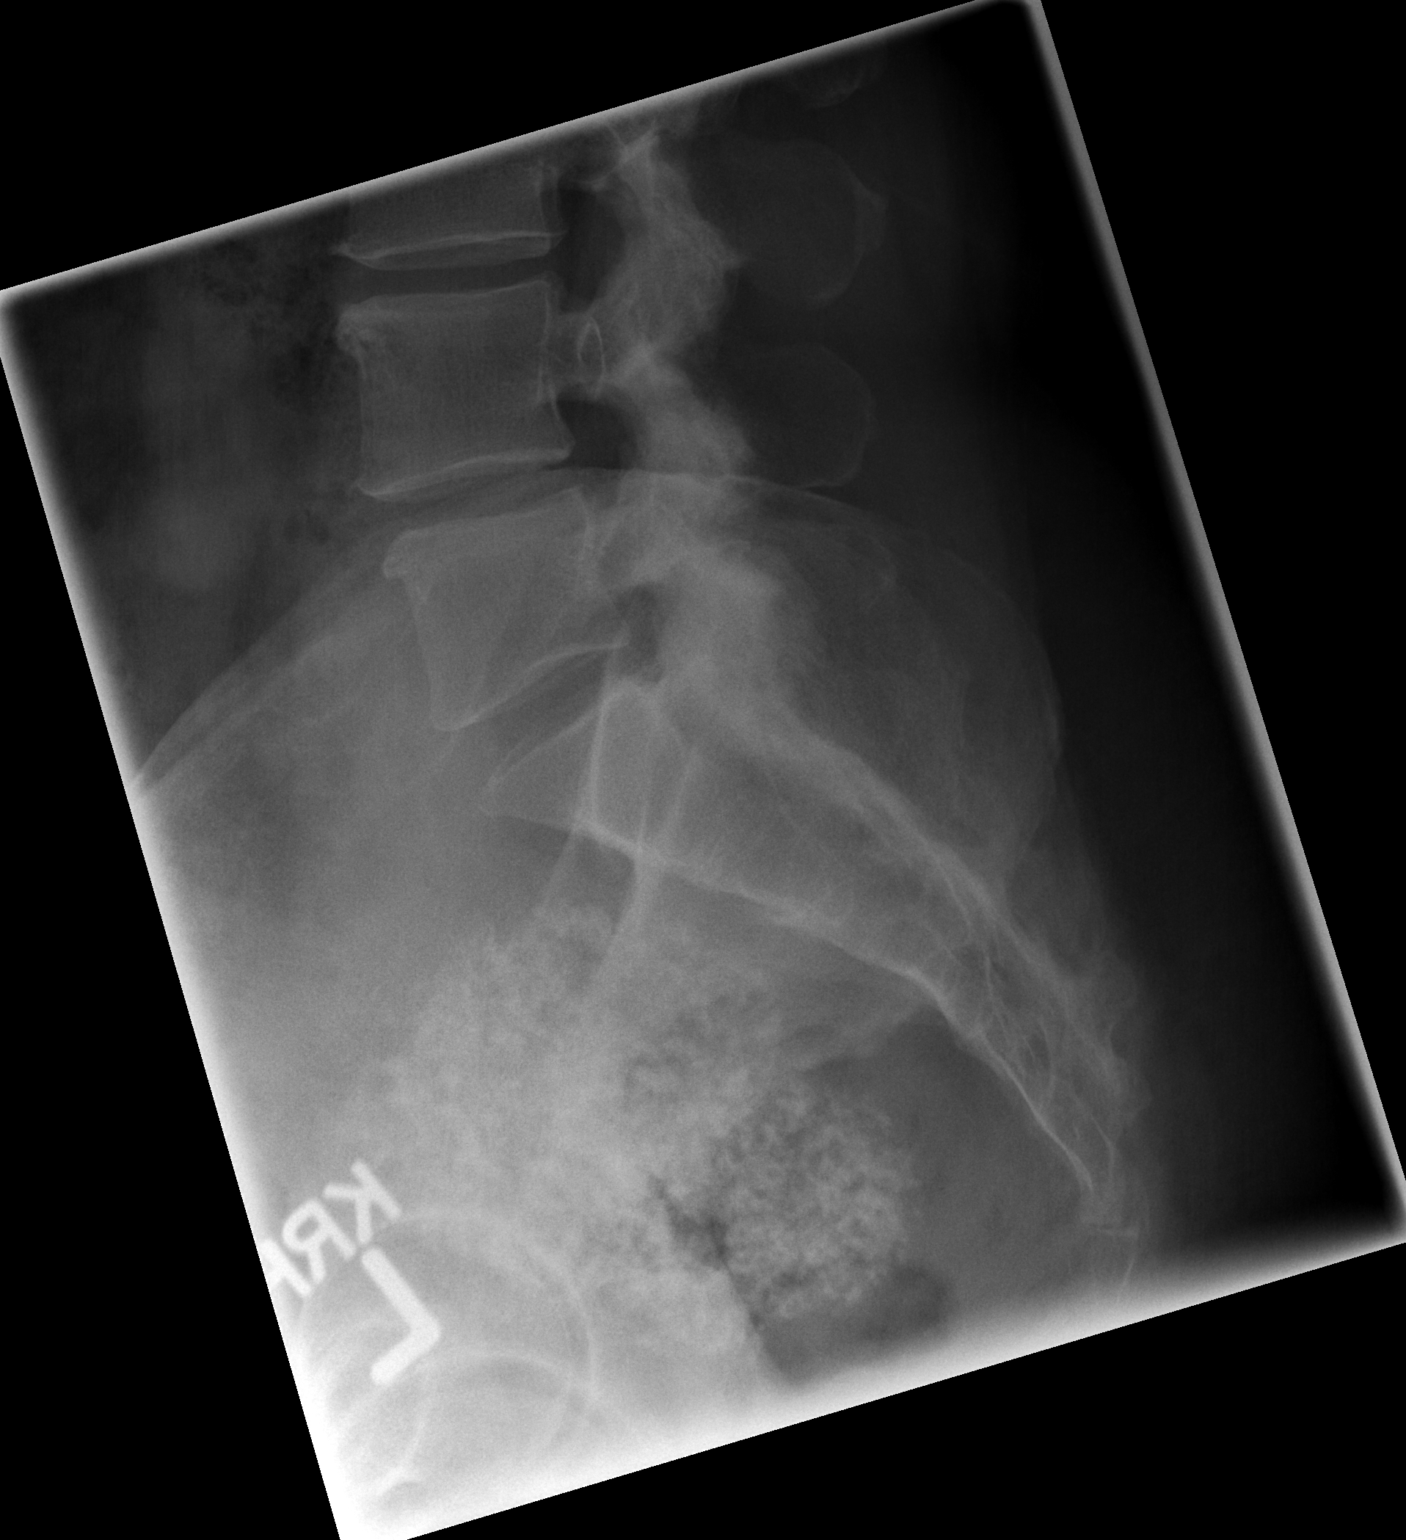

[3 of 3 positions shown; findings below may reference images not displayed]

FINDINGS: Five non-rib bearing lumbar vertebrae.
Mild scattered end plate spur formation and disc space narrowing,
greatest at L1-L2.
Vertebral body heights maintained without fracture or subluxation.
Mild facet degenerative changes lower lumbar spine.
SI joints stable.
Large calcified uterine leiomyoma in pelvis right of midline.
IMPRESSION: Scattered degenerative disc disease changes.
No acute osseous abnormalities.
Large calcified uterine leiomyoma.

## 2010-10-17 ENCOUNTER — Telehealth: Payer: Self-pay | Admitting: *Deleted

## 2010-10-17 NOTE — Telephone Encounter (Signed)
Check in centricity

## 2010-10-17 NOTE — Telephone Encounter (Signed)
Per Dr Laury Axon note: arthrtic changes + lg fibroid in uterus---can refer to gyn if symptomatic  Pt aware of results. Pt notes that fibroid have been there since her early 26s. Pt decline any issue so far.

## 2010-10-17 NOTE — Telephone Encounter (Signed)
Results with append in centricity--- -it never came to me in Epic

## 2010-10-17 NOTE — Telephone Encounter (Signed)
Pt requesting xray results. Please advise.

## 2010-10-22 ENCOUNTER — Encounter: Payer: Self-pay | Admitting: Family Medicine

## 2010-11-12 ENCOUNTER — Other Ambulatory Visit: Payer: Self-pay | Admitting: *Deleted

## 2010-11-12 DIAGNOSIS — I10 Essential (primary) hypertension: Secondary | ICD-10-CM

## 2010-11-12 MED ORDER — HYDROCHLOROTHIAZIDE 25 MG PO TABS: 25.0000 mg | ORAL_TABLET | Freq: Every day | ORAL | Status: DC

## 2010-11-12 NOTE — Telephone Encounter (Signed)
[facility] pt, last refilled 08/07/10 by Beverely Low.

## 2010-11-29 ENCOUNTER — Other Ambulatory Visit: Payer: BC Managed Care – PPO | Admitting: Internal Medicine

## 2011-04-03 ENCOUNTER — Other Ambulatory Visit: Payer: Self-pay | Admitting: Family Medicine

## 2011-05-08 ENCOUNTER — Other Ambulatory Visit: Payer: Self-pay | Admitting: Family Medicine

## 2011-06-07 ENCOUNTER — Other Ambulatory Visit: Payer: Self-pay | Admitting: Family Medicine

## 2011-07-08 ENCOUNTER — Other Ambulatory Visit: Payer: Self-pay | Admitting: Family Medicine

## 2011-07-19 ENCOUNTER — Other Ambulatory Visit: Payer: Self-pay | Admitting: Family Medicine

## 2011-07-19 DIAGNOSIS — I10 Essential (primary) hypertension: Secondary | ICD-10-CM

## 2011-07-19 DIAGNOSIS — E785 Hyperlipidemia, unspecified: Secondary | ICD-10-CM

## 2011-07-22 ENCOUNTER — Other Ambulatory Visit (INDEPENDENT_AMBULATORY_CARE_PROVIDER_SITE_OTHER): Payer: BC Managed Care – PPO

## 2011-07-22 DIAGNOSIS — E785 Hyperlipidemia, unspecified: Secondary | ICD-10-CM

## 2011-07-22 DIAGNOSIS — I10 Essential (primary) hypertension: Secondary | ICD-10-CM

## 2011-07-22 LAB — BASIC METABOLIC PANEL
CO2: 30 mEq/L (ref 19–32)
Calcium: 9.1 mg/dL (ref 8.4–10.5)
Chloride: 102 mEq/L (ref 96–112)
Glucose, Bld: 89 mg/dL (ref 70–99)
Potassium: 3.7 mEq/L (ref 3.5–5.1)
Sodium: 141 mEq/L (ref 135–145)

## 2011-07-22 LAB — LIPID PANEL
HDL: 59.4 mg/dL (ref >39.00)
LDL Cholesterol: 65 mg/dL (ref 0–99)
Total CHOL/HDL Ratio: 3

## 2011-07-22 LAB — HEPATIC FUNCTION PANEL
AST: 24 U/L (ref 0–37)
Albumin: 4.3 g/dL (ref 3.5–5.2)
Total Bilirubin: 0.3 mg/dL (ref 0.3–1.2)

## 2011-07-24 ENCOUNTER — Encounter: Payer: Self-pay | Admitting: Family Medicine

## 2011-07-24 ENCOUNTER — Ambulatory Visit (INDEPENDENT_AMBULATORY_CARE_PROVIDER_SITE_OTHER): Payer: BC Managed Care – PPO | Admitting: Family Medicine

## 2011-07-24 VITALS — BP 136/88 | HR 90 | Temp 98.8°F | Wt 208.0 lb

## 2011-07-24 DIAGNOSIS — IMO0002 Reserved for concepts with insufficient information to code with codable children: Secondary | ICD-10-CM

## 2011-07-24 MED ORDER — CYCLOBENZAPRINE HCL 10 MG PO TABS: 10.0000 mg | ORAL_TABLET | Freq: Three times a day (TID) | ORAL | Status: AC | PRN

## 2011-07-24 MED ORDER — HYDROCODONE-ACETAMINOPHEN 7.5-750 MG PO TABS: 1.0000 | ORAL_TABLET | Freq: Four times a day (QID) | ORAL | Status: AC | PRN

## 2011-07-24 NOTE — Progress Notes (Signed)
  Subjective:    Heather Pierce is a 61 y.o. female who presents for evaluation of low back pain. The patient has had recurrent self limited episodes of low back pain in the past. Symptoms have been present for 2 weeks and are unchanged.  Onset was related to / precipitated by no known injury. The pain is located in the left sacroiliac area and radiates to the left thigh. The pain is described as sharp and throbbing and occurs intermittently. She rates her pain as a 10 on a scale of 0-10. Symptoms are exacerbated by extension, flexion and lifting. Symptoms are improved by heat. She has also tried NSAIDs which provided no symptom relief. She has no other symptoms associated with the back pain. The patient has no "red flag" history indicative of complicated back pain.  The following portions of the patient's history were reviewed and updated as appropriate: allergies, current medications, past family history, past medical history, past social history, past surgical history and problem list.  Review of Systems Pertinent items are noted in HPI.    Objective:   Full range of motion without pain, no tenderness, no spasm, no curvature. Normal reflexes, gait, strength and negative straight-leg raise.    Assessment:    Nonspecific acute low back pain    Plan:    Natural history and expected course discussed. Questions answered. Agricultural engineer distributed. Stretching exercises discussed. Regular aerobic and trunk strengthening exercises discussed. Short (2-4 day) period of relative rest recommended until acute symptoms improve. Ice to affected area as needed for local pain relief. Heat to affected area as needed for local pain relief. Muscle relaxants per medication orders. Follow-up in 2 weeks. --if no better

## 2011-07-24 NOTE — Patient Instructions (Signed)
Back Pain, Adult Low back pain is very common. About 1 in 5 people have back pain.The cause of low back pain is rarely dangerous. The pain often gets better over time.About half of people with a sudden onset of back pain feel better in just 2 weeks. About 8 in 10 people feel better by 6 weeks.  CAUSES Some common causes of back pain include:  Strain of the muscles or ligaments supporting the spine.   Wear and tear (degeneration) of the spinal discs.   Arthritis.   Direct injury to the back.  DIAGNOSIS Most of the time, the direct cause of low back pain is not known.However, back pain can be treated effectively even when the exact cause of the pain is unknown.Answering your caregiver's questions about your overall health and symptoms is one of the most accurate ways to make sure the cause of your pain is not dangerous. If your caregiver needs more information, he or she may order lab work or imaging tests (X-rays or MRIs).However, even if imaging tests show changes in your back, this usually does not require surgery. HOME CARE INSTRUCTIONS For many people, back pain returns.Since low back pain is rarely dangerous, it is often a condition that people can learn to manageon their own.   Remain active. It is stressful on the back to sit or stand in one place. Do not sit, drive, or stand in one place for more than 30 minutes at a time. Take short walks on level surfaces as soon as pain allows.Try to increase the length of time you walk each day.   Do not stay in bed.Resting more than 1 or 2 days can delay your recovery.   Do not avoid exercise or work.Your body is made to move.It is not dangerous to be active, even though your back may hurt.Your back will likely heal faster if you return to being active before your pain is gone.   Pay attention to your body when you bend and lift. Many people have less discomfortwhen lifting if they bend their knees, keep the load close to their  bodies,and avoid twisting. Often, the most comfortable positions are those that put less stress on your recovering back.   Find a comfortable position to sleep. Use a firm mattress and lie on your side with your knees slightly bent. If you lie on your back, put a pillow under your knees.   Only take over-the-counter or prescription medicines as directed by your caregiver. Over-the-counter medicines to reduce pain and inflammation are often the most helpful.Your caregiver may prescribe muscle relaxant drugs.These medicines help dull your pain so you can more quickly return to your normal activities and healthy exercise.   Put ice on the injured area.   Put ice in a plastic bag.   Place a towel between your skin and the bag.   Leave the ice on for 15 to 20 minutes, 3 to 4 times a day for the first 2 to 3 days. After that, ice and heat may be alternated to reduce pain and spasms.   Ask your caregiver about trying back exercises and gentle massage. This may be of some benefit.   Avoid feeling anxious or stressed.Stress increases muscle tension and can worsen back pain.It is important to recognize when you are anxious or stressed and learn ways to manage it.Exercise is a great option.  SEEK MEDICAL CARE IF:  You have pain that is not relieved with rest or medicine.   You have   pain that does not improve in 1 week.   You have new symptoms.   You are generally not feeling well.  SEEK IMMEDIATE MEDICAL CARE IF:   You have pain that radiates from your back into your legs.   You develop new bowel or bladder control problems.   You have unusual weakness or numbness in your arms or legs.   You develop nausea or vomiting.   You develop abdominal pain.   You feel faint.  Document Released: 06/17/2005 Document Revised: 02/27/2011 Document Reviewed: 11/05/2010 [employer] Patient Information 2012 [employer]. 

## 2011-08-09 ENCOUNTER — Telehealth: Payer: Self-pay | Admitting: Family Medicine

## 2011-08-09 MED ORDER — SIMVASTATIN 40 MG PO TABS: 40.0000 mg | ORAL_TABLET | Freq: Every day | ORAL | Status: DC

## 2011-08-09 NOTE — Telephone Encounter (Signed)
Refill: Symvastatin 40 mg tablet. Take 1 tablet by mouth every day (labs due). Qty. 30. Last fill 07-08-11

## 2011-08-09 NOTE — Telephone Encounter (Signed)
Faxed.   KP 

## 2011-08-26 ENCOUNTER — Other Ambulatory Visit: Payer: Self-pay | Admitting: Family Medicine

## 2011-08-26 DIAGNOSIS — Z1231 Encounter for screening mammogram for malignant neoplasm of breast: Secondary | ICD-10-CM

## 2011-09-03 ENCOUNTER — Ambulatory Visit (HOSPITAL_BASED_OUTPATIENT_CLINIC_OR_DEPARTMENT_OTHER)
Admission: RE | Admit: 2011-09-03 | Discharge: 2011-09-03 | Disposition: A | Discharge status: Home / Self Care | Payer: BC Managed Care – PPO | Source: Ambulatory Visit | Attending: Family Medicine | Admitting: Family Medicine

## 2011-09-03 DIAGNOSIS — Z1231 Encounter for screening mammogram for malignant neoplasm of breast: Secondary | ICD-10-CM | POA: Insufficient documentation

## 2011-09-03 IMAGING — MG MM DIGITAL SCREENING BILAT
4 series · 4 of 4 positions shown · non-contrast
Comparison: none

DG SCREEN MAMMOGRAM BILATERAL
Bilateral CC and MLO view(s) were taken.
Technologists: MEZEY, RT (R)(M); MEZEY, RT (R)

DIGITAL SCREENING MAMMOGRAM WITH CAD:
The breast tissue is heterogeneously dense.  No masses or malignant type calcifications are 
identified.  Compared with prior studies.
Images were processed with CAD.

[R CC]
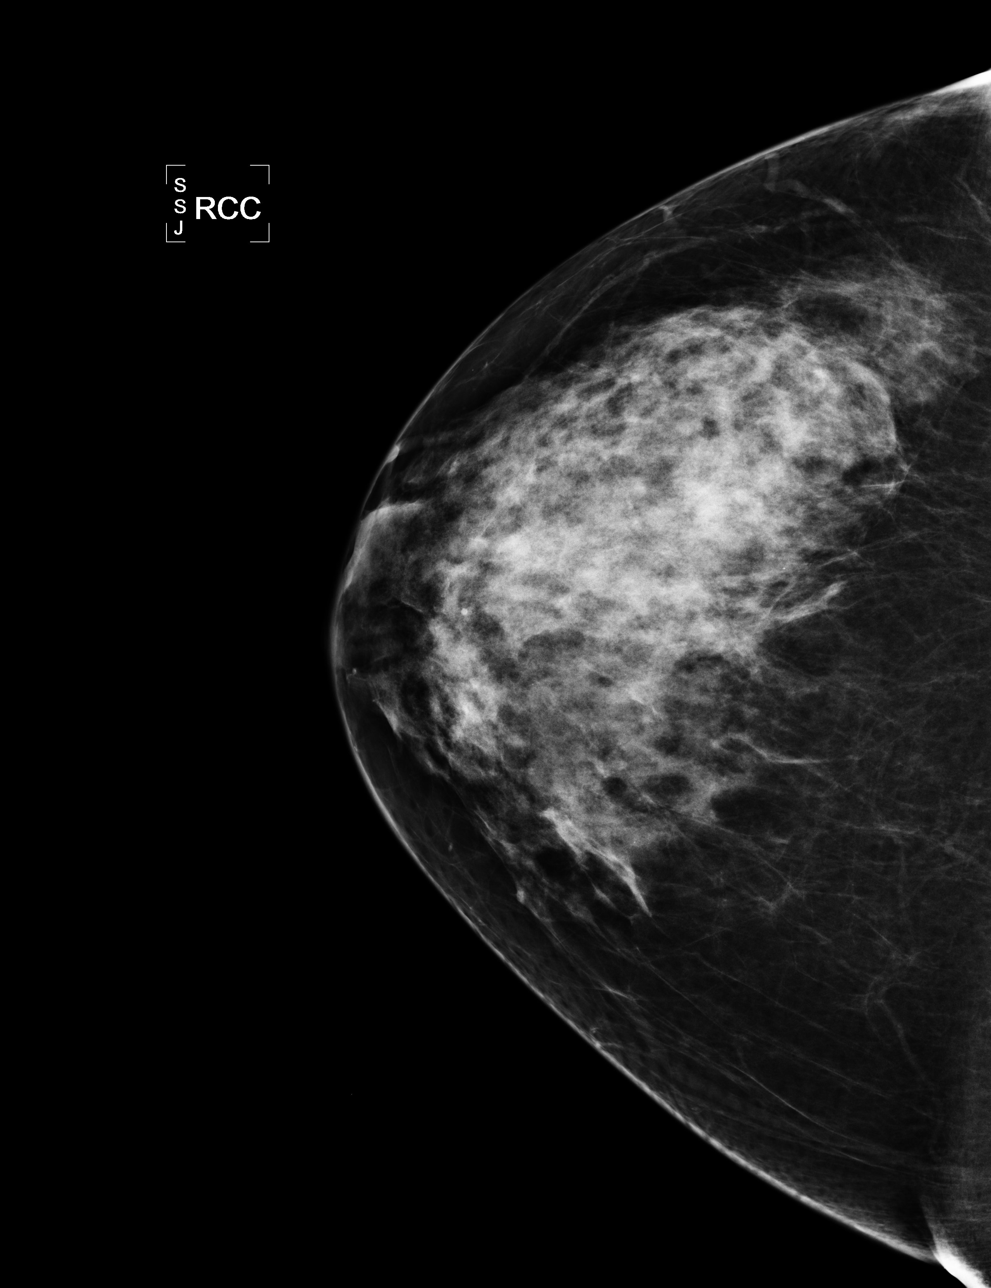

[L CC]
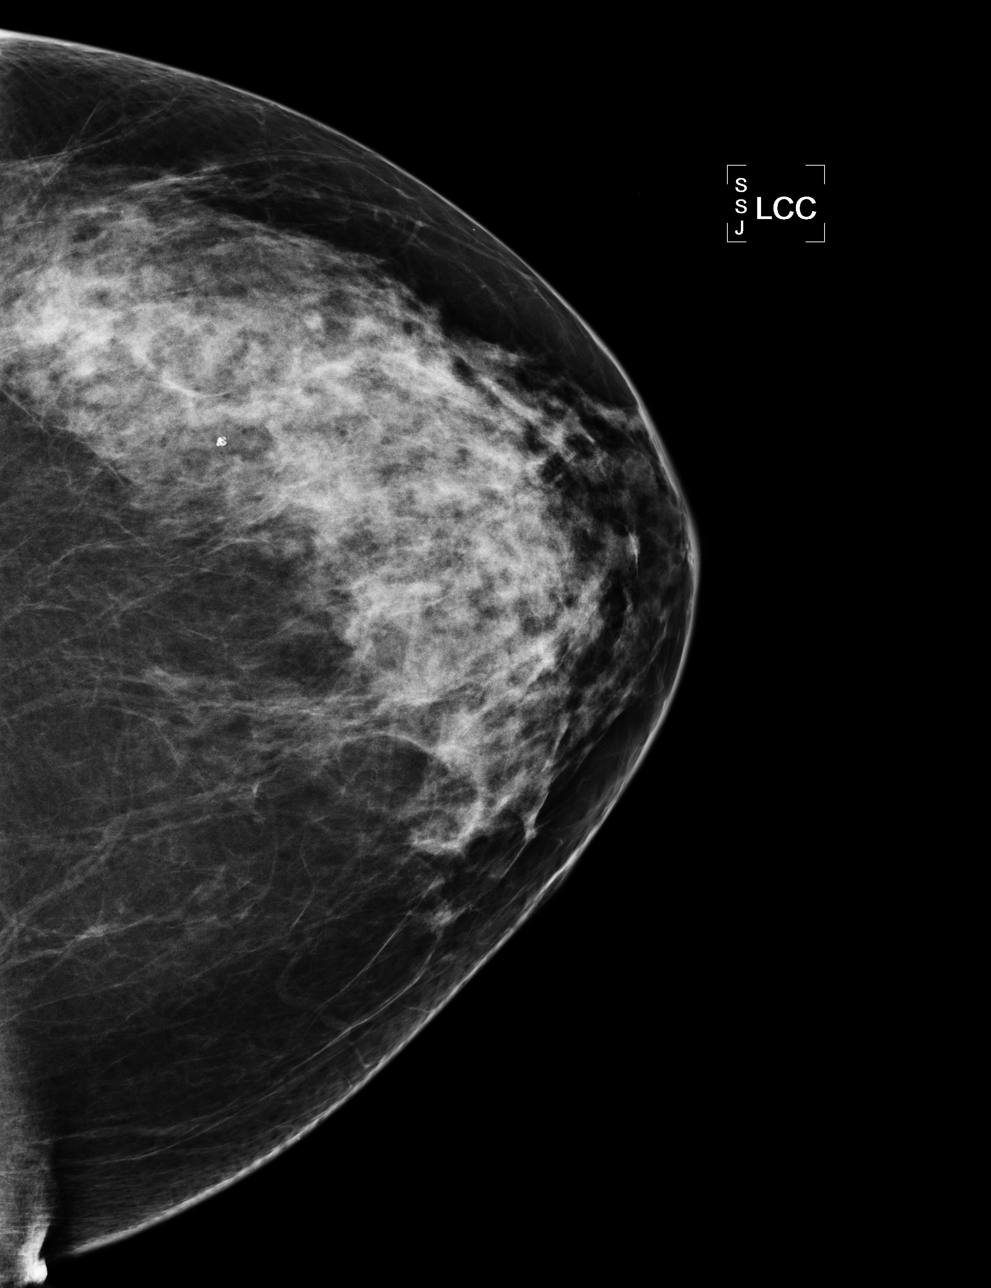

[L MLO]
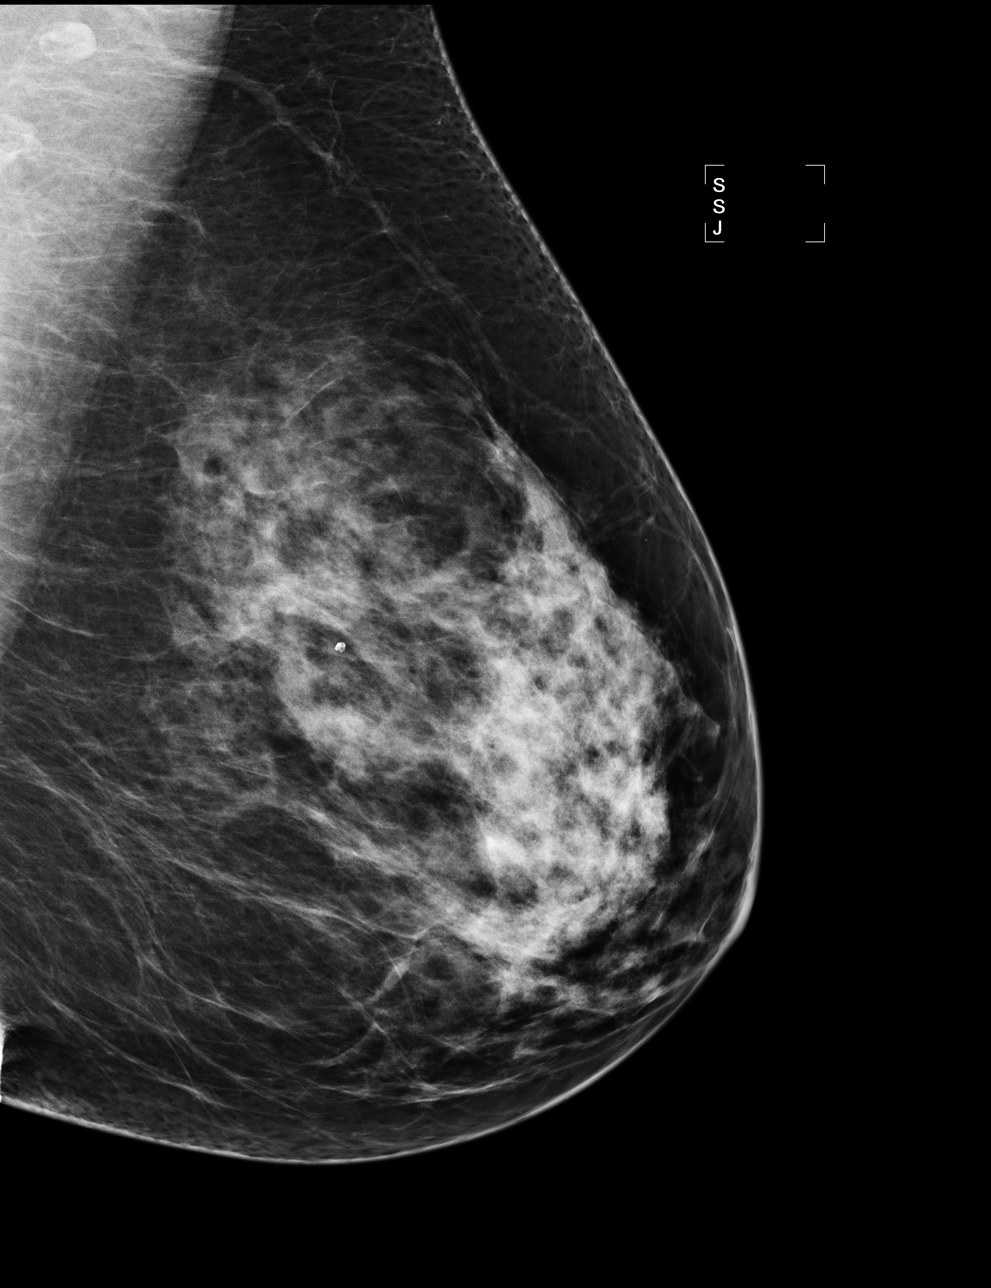

[R MLO]
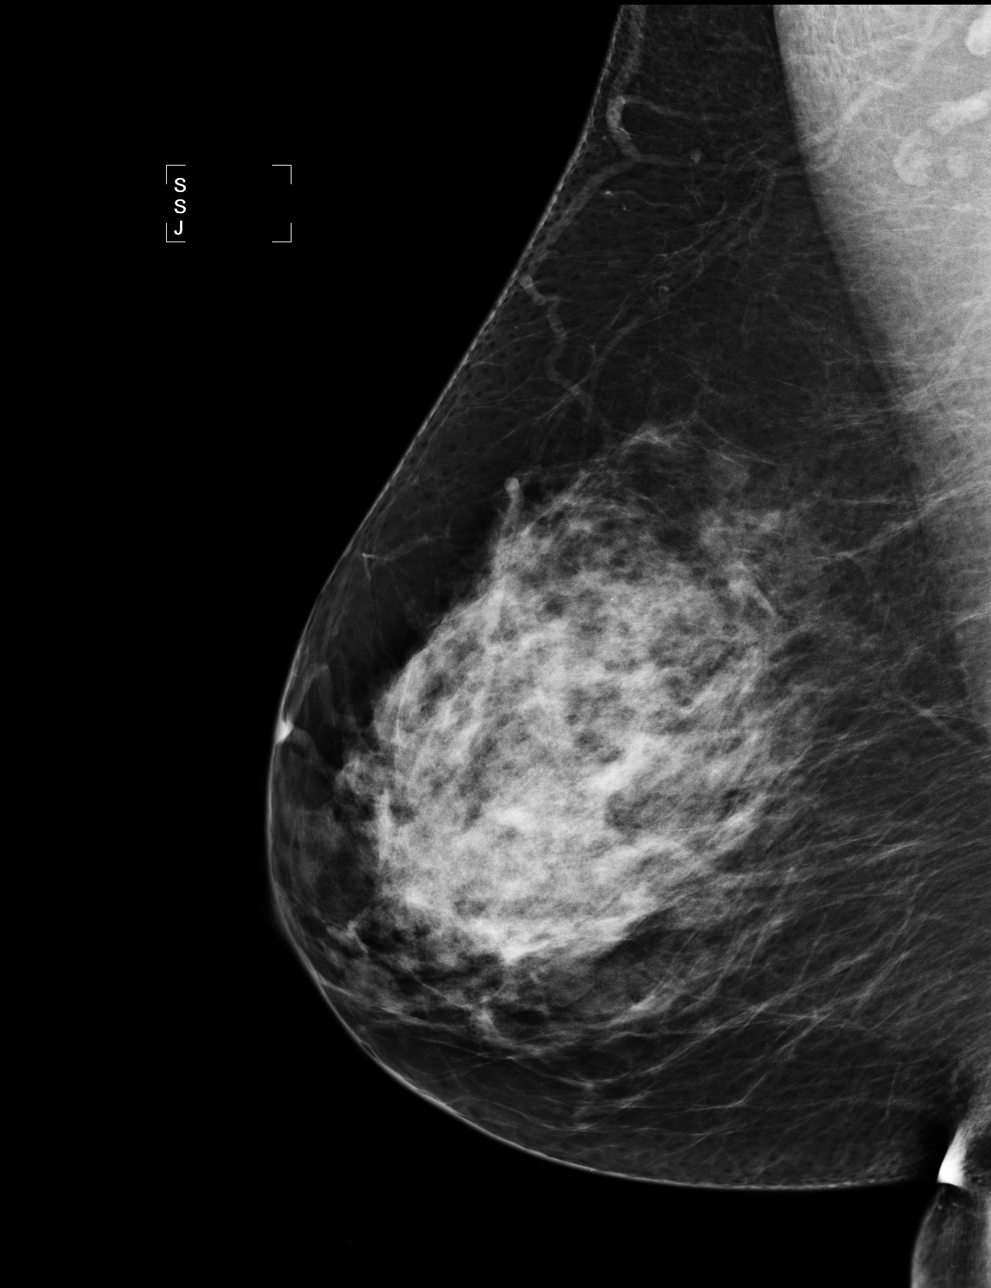

[4 of 4 positions shown; findings below may reference images not displayed]

IMPRESSION: No specific mammographic evidence of malignancy.  Next screening mammogram is recommended in one 
year.

A result letter of this screening mammogram will be mailed directly to the patient.

ASSESSMENT: Negative - BI-RADS 1

Screening mammogram in 1 year.
,

## 2011-09-04 ENCOUNTER — Ambulatory Visit (HOSPITAL_BASED_OUTPATIENT_CLINIC_OR_DEPARTMENT_OTHER): Payer: BC Managed Care – PPO

## 2011-09-10 ENCOUNTER — Encounter: Payer: Self-pay | Admitting: Family Medicine

## 2011-09-10 ENCOUNTER — Ambulatory Visit (INDEPENDENT_AMBULATORY_CARE_PROVIDER_SITE_OTHER): Payer: BC Managed Care – PPO | Admitting: Family Medicine

## 2011-09-10 VITALS — BP 134/82 | HR 96 | Temp 98.4°F | Ht 63.0 in | Wt 208.0 lb

## 2011-09-10 DIAGNOSIS — K219 Gastro-esophageal reflux disease without esophagitis: Secondary | ICD-10-CM

## 2011-09-10 DIAGNOSIS — Z0000 Encounter for general adult medical examination without abnormal findings: Secondary | ICD-10-CM

## 2011-09-10 DIAGNOSIS — E785 Hyperlipidemia, unspecified: Secondary | ICD-10-CM

## 2011-09-10 DIAGNOSIS — N390 Urinary tract infection, site not specified: Secondary | ICD-10-CM

## 2011-09-10 DIAGNOSIS — Z780 Asymptomatic menopausal state: Secondary | ICD-10-CM

## 2011-09-10 DIAGNOSIS — I10 Essential (primary) hypertension: Secondary | ICD-10-CM

## 2011-09-10 LAB — BASIC METABOLIC PANEL
BUN: 16 mg/dL (ref 6–23)
CO2: 29 mEq/L (ref 19–32)
Chloride: 100 mEq/L (ref 96–112)
Glucose, Bld: 89 mg/dL (ref 70–99)
Potassium: 3.7 mEq/L (ref 3.5–5.1)
Sodium: 138 mEq/L (ref 135–145)

## 2011-09-10 LAB — LIPID PANEL
Cholesterol: 169 mg/dL (ref 0–200)
LDL Cholesterol: 72 mg/dL (ref 0–99)
Triglycerides: 153 mg/dL — ABNORMAL HIGH (ref 0.0–149.0)
VLDL: 30.6 mg/dL (ref 0.0–40.0)

## 2011-09-10 LAB — POCT URINALYSIS DIPSTICK
Blood, UA: NEGATIVE
Glucose, UA: NEGATIVE
Ketones, UA: NEGATIVE
Protein, UA: NEGATIVE
Spec Grav, UA: 1.025

## 2011-09-10 LAB — CBC WITH DIFFERENTIAL/PLATELET
Basophils Absolute: 0 10*3/uL (ref 0.0–0.1)
HCT: 40.6 % (ref 36.0–46.0)
Hemoglobin: 13.6 g/dL (ref 12.0–15.0)
Lymphs Abs: 2.1 10*3/uL (ref 0.7–4.0)
MCHC: 33.4 g/dL (ref 30.0–36.0)
MCV: 86.8 fl (ref 78.0–100.0)
Monocytes Absolute: 0.5 10*3/uL (ref 0.1–1.0)
Monocytes Relative: 7.1 % (ref 3.0–12.0)
Neutro Abs: 3.9 10*3/uL (ref 1.4–7.7)
Platelets: 198 10*3/uL (ref 150.0–400.0)
RDW: 13.4 % (ref 11.5–14.6)

## 2011-09-10 LAB — HEPATIC FUNCTION PANEL
AST: 31 U/L (ref 0–37)
Albumin: 4.4 g/dL (ref 3.5–5.2)
Total Bilirubin: 0.5 mg/dL (ref 0.3–1.2)

## 2011-09-10 LAB — TSH: TSH: 1.05 u[IU]/mL (ref 0.35–5.50)

## 2011-09-10 NOTE — Assessment & Plan Note (Signed)
con't meds  Check labs 

## 2011-09-10 NOTE — Patient Instructions (Signed)
Preventive Care for Adults, Female A healthy lifestyle and preventive care can promote health and wellness. Preventive health guidelines for women include the following key practices.  A routine yearly physical is a good way to check with your caregiver about your health and preventive screening. It is a chance to share any concerns and updates on your health, and to receive a thorough exam.   Visit your dentist for a routine exam and preventive care every 6 months. Brush your teeth twice a day and floss once a day. Good oral hygiene prevents tooth decay and gum disease.   The frequency of eye exams is based on your age, health, family medical history, use of contact lenses, and other factors. Follow your caregiver's recommendations for frequency of eye exams.   Eat a healthy diet. Foods like vegetables, fruits, whole grains, low-fat dairy products, and lean protein foods contain the nutrients you need without too many calories. Decrease your intake of foods high in solid fats, added sugars, and salt. Eat the right amount of calories for you.Get information about a proper diet from your caregiver, if necessary.   Regular physical exercise is one of the most important things you can do for your health. Most adults should get at least 150 minutes of moderate-intensity exercise (any activity that increases your heart rate and causes you to sweat) each week. In addition, most adults need muscle-strengthening exercises on 2 or more days a week.   Maintain a healthy weight. The body mass index (BMI) is a screening tool to identify possible weight problems. It provides an estimate of body fat based on height and weight. Your caregiver can help determine your BMI, and can help you achieve or maintain a healthy weight.For adults 20 years and older:   A BMI below 18.5 is considered underweight.   A BMI of 18.5 to 24.9 is normal.   A BMI of 25 to 29.9 is considered overweight.   A BMI of 30 and above is  considered obese.   Maintain normal blood lipids and cholesterol levels by exercising and minimizing your intake of saturated fat. Eat a balanced diet with plenty of fruit and vegetables. Blood tests for lipids and cholesterol should begin at age 20 and be repeated every 5 years. If your lipid or cholesterol levels are high, you are over 50, or you are at high risk for heart disease, you may need your cholesterol levels checked more frequently.Ongoing high lipid and cholesterol levels should be treated with medicines if diet and exercise are not effective.   If you smoke, find out from your caregiver how to quit. If you do not use tobacco, do not start.   If you are pregnant, do not drink alcohol. If you are breastfeeding, be very cautious about drinking alcohol. If you are not pregnant and choose to drink alcohol, do not exceed 1 drink per day. One drink is considered to be 12 ounces (355 mL) of beer, 5 ounces (148 mL) of wine, or 1.5 ounces (44 mL) of liquor.   Avoid use of street drugs. Do not share needles with anyone. Ask for help if you need support or instructions about stopping the use of drugs.   High blood pressure causes heart disease and increases the risk of stroke. Your blood pressure should be checked at least every 1 to 2 years. Ongoing high blood pressure should be treated with medicines if weight loss and exercise are not effective.   If you are 55 to 61   years old, ask your caregiver if you should take aspirin to prevent strokes.   Diabetes screening involves taking a blood sample to check your fasting blood sugar level. This should be done once every 3 years, after age 45, if you are within normal weight and without risk factors for diabetes. Testing should be considered at a younger age or be carried out more frequently if you are overweight and have at least 1 risk factor for diabetes.   Breast cancer screening is essential preventive care for women. You should practice "breast  self-awareness." This means understanding the normal appearance and feel of your breasts and may include breast self-examination. Any changes detected, no matter how small, should be reported to a caregiver. Women in their 20s and 30s should have a clinical breast exam (CBE) by a caregiver as part of a regular health exam every 1 to 3 years. After age 40, women should have a CBE every year. Starting at age 40, women should consider having a mammography (breast X-ray test) every year. Women who have a family history of breast cancer should talk to their caregiver about genetic screening. Women at a high risk of breast cancer should talk to their caregivers about having magnetic resonance imaging (MRI) and a mammography every year.   The Pap test is a screening test for cervical cancer. A Pap test can show cell changes on the cervix that might become cervical cancer if left untreated. A Pap test is a procedure in which cells are obtained and examined from the lower end of the uterus (cervix).   Women should have a Pap test starting at age 21.   Between ages 21 and 29, Pap tests should be repeated every 2 years.   Beginning at age 30, you should have a Pap test every 3 years as long as the past 3 Pap tests have been normal.   Some women have medical problems that increase the chance of getting cervical cancer. Talk to your caregiver about these problems. It is especially important to talk to your caregiver if a new problem develops soon after your last Pap test. In these cases, your caregiver may recommend more frequent screening and Pap tests.   The above recommendations are the same for women who have or have not gotten the vaccine for human papillomavirus (HPV).   If you had a hysterectomy for a problem that was not cancer or a condition that could lead to cancer, then you no longer need Pap tests. Even if you no longer need a Pap test, a regular exam is a good idea to make sure no other problems are  starting.   If you are between ages 65 and 70, and you have had normal Pap tests going back 10 years, you no longer need Pap tests. Even if you no longer need a Pap test, a regular exam is a good idea to make sure no other problems are starting.   If you have had past treatment for cervical cancer or a condition that could lead to cancer, you need Pap tests and screening for cancer for at least 20 years after your treatment.   If Pap tests have been discontinued, risk factors (such as a new sexual partner) need to be reassessed to determine if screening should be resumed.   The HPV test is an additional test that may be used for cervical cancer screening. The HPV test looks for the virus that can cause the cell changes on the cervix.   The cells collected during the Pap test can be tested for HPV. The HPV test could be used to screen women aged 30 years and older, and should be used in women of any age who have unclear Pap test results. After the age of 30, women should have HPV testing at the same frequency as a Pap test.   Colorectal cancer can be detected and often prevented. Most routine colorectal cancer screening begins at the age of 50 and continues through age 75. However, your caregiver may recommend screening at an earlier age if you have risk factors for colon cancer. On a yearly basis, your caregiver may provide home test kits to check for hidden blood in the stool. Use of a small camera at the end of a tube, to directly examine the colon (sigmoidoscopy or colonoscopy), can detect the earliest forms of colorectal cancer. Talk to your caregiver about this at age 50, when routine screening begins. Direct examination of the colon should be repeated every 5 to 10 years through age 75, unless early forms of pre-cancerous polyps or small growths are found.   Hepatitis C blood testing is recommended for all people born from 1945 through 1965 and any individual with known risks for hepatitis C.    Practice safe sex. Use condoms and avoid high-risk sexual practices to reduce the spread of sexually transmitted infections (STIs). STIs include gonorrhea, chlamydia, syphilis, trichomonas, herpes, HPV, and human immunodeficiency virus (HIV). Herpes, HIV, and HPV are viral illnesses that have no cure. They can result in disability, cancer, and death. Sexually active women aged 25 and younger should be checked for chlamydia. Older women with new or multiple partners should also be tested for chlamydia. Testing for other STIs is recommended if you are sexually active and at increased risk.   Osteoporosis is a disease in which the bones lose minerals and strength with aging. This can result in serious bone fractures. The risk of osteoporosis can be identified using a bone density scan. Women ages 65 and over and women at risk for fractures or osteoporosis should discuss screening with their caregivers. Ask your caregiver whether you should take a calcium supplement or vitamin D to reduce the rate of osteoporosis.   Menopause can be associated with physical symptoms and risks. Hormone replacement therapy is available to decrease symptoms and risks. You should talk to your caregiver about whether hormone replacement therapy is right for you.   Use sunscreen with sun protection factor (SPF) of 30 or more. Apply sunscreen liberally and repeatedly throughout the day. You should seek shade when your shadow is shorter than you. Protect yourself by wearing long sleeves, pants, a wide-brimmed hat, and sunglasses year round, whenever you are outdoors.   Once a month, do a whole body skin exam, using a mirror to look at the skin on your back. Notify your caregiver of new moles, moles that have irregular borders, moles that are larger than a pencil eraser, or moles that have changed in shape or color.   Stay current with required immunizations.   Influenza. You need a dose every fall (or winter). The composition of  the flu vaccine changes each year, so being vaccinated once is not enough.   Pneumococcal polysaccharide. You need 1 to 2 doses if you smoke cigarettes or if you have certain chronic medical conditions. You need 1 dose at age 65 (or older) if you have never been vaccinated.   Tetanus, diphtheria, pertussis (Tdap, Td). Get 1 dose of   Tdap vaccine if you are younger than age 65, are over 65 and have contact with an infant, are a healthcare worker, are pregnant, or simply want to be protected from whooping cough. After that, you need a Td booster dose every 10 years. Consult your caregiver if you have not had at least 3 tetanus and diphtheria-containing shots sometime in your life or have a deep or dirty wound.   HPV. You need this vaccine if you are a woman age 26 or younger. The vaccine is given in 3 doses over 6 months.   Measles, mumps, rubella (MMR). You need at least 1 dose of MMR if you were born in 1957 or later. You may also need a second dose.   Meningococcal. If you are age 19 to 21 and a first-year college student living in a residence hall, or have one of several medical conditions, you need to get vaccinated against meningococcal disease. You may also need additional booster doses.   Zoster (shingles). If you are age 60 or older, you should get this vaccine.   Varicella (chickenpox). If you have never had chickenpox or you were vaccinated but received only 1 dose, talk to your caregiver to find out if you need this vaccine.   Hepatitis A. You need this vaccine if you have a specific risk factor for hepatitis A virus infection or you simply wish to be protected from this disease. The vaccine is usually given as 2 doses, 6 to 18 months apart.   Hepatitis B. You need this vaccine if you have a specific risk factor for hepatitis B virus infection or you simply wish to be protected from this disease. The vaccine is given in 3 doses, usually over 6 months.  Preventive Services /  Frequency Ages 19 to 39  Blood pressure check.** / Every 1 to 2 years.   Lipid and cholesterol check.** / Every 5 years beginning at age 20.   Clinical breast exam.** / Every 3 years for women in their 20s and 30s.   Pap test.** / Every 2 years from ages 21 through 29. Every 3 years starting at age 30 through age 65 or 70 with a history of 3 consecutive normal Pap tests.   HPV screening.** / Every 3 years from ages 30 through ages 65 to 70 with a history of 3 consecutive normal Pap tests.   Hepatitis C blood test.** / For any individual with known risks for hepatitis C.   Skin self-exam. / Monthly.   Influenza immunization.** / Every year.   Pneumococcal polysaccharide immunization.** / 1 to 2 doses if you smoke cigarettes or if you have certain chronic medical conditions.   Tetanus, diphtheria, pertussis (Tdap, Td) immunization. / A one-time dose of Tdap vaccine. After that, you need a Td booster dose every 10 years.   HPV immunization. / 3 doses over 6 months, if you are 26 and younger.   Measles, mumps, rubella (MMR) immunization. / You need at least 1 dose of MMR if you were born in 1957 or later. You may also need a second dose.   Meningococcal immunization. / 1 dose if you are age 19 to 21 and a first-year college student living in a residence hall, or have one of several medical conditions, you need to get vaccinated against meningococcal disease. You may also need additional booster doses.   Varicella immunization.** / Consult your caregiver.   Hepatitis A immunization.** / Consult your caregiver. 2 doses, 6 to 18 months   apart.   Hepatitis B immunization.** / Consult your caregiver. 3 doses usually over 6 months.  Ages 40 to 64  Blood pressure check.** / Every 1 to 2 years.   Lipid and cholesterol check.** / Every 5 years beginning at age 20.   Clinical breast exam.** / Every year after age 40.   Mammogram.** / Every year beginning at age 40 and continuing for as  long as you are in good health. Consult with your caregiver.   Pap test.** / Every 3 years starting at age 30 through age 65 or 70 with a history of 3 consecutive normal Pap tests.   HPV screening.** / Every 3 years from ages 30 through ages 65 to 70 with a history of 3 consecutive normal Pap tests.   Fecal occult blood test (FOBT) of stool. / Every year beginning at age 50 and continuing until age 75. You may not need to do this test if you get a colonoscopy every 10 years.   Flexible sigmoidoscopy or colonoscopy.** / Every 5 years for a flexible sigmoidoscopy or every 10 years for a colonoscopy beginning at age 50 and continuing until age 75.   Hepatitis C blood test.** / For all people born from 1945 through 1965 and any individual with known risks for hepatitis C.   Skin self-exam. / Monthly.   Influenza immunization.** / Every year.   Pneumococcal polysaccharide immunization.** / 1 to 2 doses if you smoke cigarettes or if you have certain chronic medical conditions.   Tetanus, diphtheria, pertussis (Tdap, Td) immunization.** / A one-time dose of Tdap vaccine. After that, you need a Td booster dose every 10 years.   Measles, mumps, rubella (MMR) immunization. / You need at least 1 dose of MMR if you were born in 1957 or later. You may also need a second dose.   Varicella immunization.** / Consult your caregiver.   Meningococcal immunization.** / Consult your caregiver.   Hepatitis A immunization.** / Consult your caregiver. 2 doses, 6 to 18 months apart.   Hepatitis B immunization.** / Consult your caregiver. 3 doses, usually over 6 months.  Ages 65 and over  Blood pressure check.** / Every 1 to 2 years.   Lipid and cholesterol check.** / Every 5 years beginning at age 20.   Clinical breast exam.** / Every year after age 40.   Mammogram.** / Every year beginning at age 40 and continuing for as long as you are in good health. Consult with your caregiver.   Pap test.** /  Every 3 years starting at age 30 through age 65 or 70 with a 3 consecutive normal Pap tests. Testing can be stopped between 65 and 70 with 3 consecutive normal Pap tests and no abnormal Pap or HPV tests in the past 10 years.   HPV screening.** / Every 3 years from ages 30 through ages 65 or 70 with a history of 3 consecutive normal Pap tests. Testing can be stopped between 65 and 70 with 3 consecutive normal Pap tests and no abnormal Pap or HPV tests in the past 10 years.   Fecal occult blood test (FOBT) of stool. / Every year beginning at age 50 and continuing until age 75. You may not need to do this test if you get a colonoscopy every 10 years.   Flexible sigmoidoscopy or colonoscopy.** / Every 5 years for a flexible sigmoidoscopy or every 10 years for a colonoscopy beginning at age 50 and continuing until age 75.   Hepatitis   C blood test.** / For all people born from 1945 through 1965 and any individual with known risks for hepatitis C.   Osteoporosis screening.** / A one-time screening for women ages 65 and over and women at risk for fractures or osteoporosis.   Skin self-exam. / Monthly.   Influenza immunization.** / Every year.   Pneumococcal polysaccharide immunization.** / 1 dose at age 65 (or older) if you have never been vaccinated.   Tetanus, diphtheria, pertussis (Tdap, Td) immunization. / A one-time dose of Tdap vaccine if you are over 65 and have contact with an infant, are a healthcare worker, or simply want to be protected from whooping cough. After that, you need a Td booster dose every 10 years.   Varicella immunization.** / Consult your caregiver.   Meningococcal immunization.** / Consult your caregiver.   Hepatitis A immunization.** / Consult your caregiver. 2 doses, 6 to 18 months apart.   Hepatitis B immunization.** / Check with your caregiver. 3 doses, usually over 6 months.  ** Family history and personal history of risk and conditions may change your caregiver's  recommendations. Document Released: 08/13/2001 Document Revised: 06/06/2011 Document Reviewed: 11/12/2010 [employer] Patient Information 2012 [employer]. 

## 2011-09-10 NOTE — Progress Notes (Signed)
Subjective:     Heather Pierce is a 61 y.o. female and is here for a comprehensive physical exam. The patient reports no problems.  History   Social History  . Marital Status: Single    Spouse Name: N/A    Number of Children: N/A  . Years of Education: N/A   Occupational History  . 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  .     Social History Main Topics  . Smoking status: Never Smoker   . Smokeless tobacco: Never Used  . Alcohol Use: Yes     rare  . Drug Use: No  . Sexually Active: No   Other Topics Concern  . Not on file   Social History Narrative  . No narrative on file   Health Maintenance  Topic Date Due  . Colonoscopy  03/13/2009  . Zostavax  12/14/2010  . Influenza Vaccine  03/31/2012  . Mammogram  09/02/2013  . Pap Smear  09/24/2013  . Tetanus/tdap  04/19/2019    The following portions of the patient's history were reviewed and updated as appropriate: allergies, current medications, past family history, past medical history, past social history, past surgical history and problem list.  Review of Systems Review of Systems  Constitutional: Negative for activity change, appetite change and fatigue.  HENT: Negative for hearing loss, congestion, tinnitus and ear discharge.  dentist q17m Eyes: Negative for visual disturbance (see optho q1y -- vision corrected to 20/20 with glasses).  Respiratory: Negative for cough, chest tightness and shortness of breath.   Cardiovascular: Negative for chest pain, palpitations and leg swelling.  Gastrointestinal: Negative for abdominal pain, diarrhea, constipation and abdominal distention.  Genitourinary: Negative for urgency, frequency, decreased urine volume and difficulty urinating.  Musculoskeletal: Negative for back pain, arthralgias and gait problem.  Skin: Negative for color change, pallor and rash.  Neurological: Negative for dizziness, light-headedness, numbness and headaches.  Hematological: Negative for adenopathy. Does not  bruise/bleed easily.  Psychiatric/Behavioral: Negative for suicidal ideas, confusion, sleep disturbance, self-injury, dysphoric mood, decreased concentration and agitation.       Objective:    BP 134/82  Pulse 96  Temp(Src) 98.4 F (36.9 C) (Oral)  Ht 5\' 3"  (1.6 m)  Wt 208 lb (94.348 kg)  BMI 36.85 kg/m2  SpO2 98%  LMP 09/24/2005 General appearance: alert, cooperative, appears stated age and no distress Head: Normocephalic, without obvious abnormality, atraumatic Eyes: conjunctivae/corneas clear. PERRL, EOM's intact. Fundi benign. Ears: normal TM's and external ear canals both ears Nose: Nares normal. Septum midline. Mucosa normal. No drainage or sinus tenderness. Throat: lips, mucosa, and tongue normal; teeth and gums normal Neck: no adenopathy, no carotid bruit, no JVD, supple, symmetrical, trachea midline and thyroid not enlarged, symmetric, no tenderness/mass/nodules Back: symmetric, no curvature. ROM normal. No CVA tenderness. Lungs: clear to auscultation bilaterally Breasts: normal appearance, no masses or tenderness Heart: regular rate and rhythm, S1, S2 normal, no murmur, click, rub or gallop Abdomen: soft, non-tender; bowel sounds normal; no masses,  no organomegaly Pelvic: not indicated; post-menopausal, no abnormal Pap smears in past Extremities: extremities normal, atraumatic, no cyanosis or edema Pulses: 2+ and symmetric Skin: Skin color, texture, turgor normal. No rashes or lesions Lymph nodes: Cervical, supraclavicular, and axillary nodes normal. Neurologic: Alert and oriented X 3, normal strength and tone. Normal symmetric reflexes. Normal coordination and gait psych--no depression, anxiety    Assessment:    Healthy female exam.       Plan:     See After Visit  Summary for Counseling Recommendations

## 2011-09-10 NOTE — Progress Notes (Signed)
Addended by: Silvio Pate D on: 09/10/2011 03:37 PM   Modules accepted: Orders

## 2011-09-10 NOTE — Assessment & Plan Note (Signed)
Stable con't meds 

## 2011-09-10 NOTE — Assessment & Plan Note (Signed)
con't meds 

## 2011-09-12 LAB — URINE CULTURE: Colony Count: NO GROWTH

## 2011-10-08 ENCOUNTER — Other Ambulatory Visit: Payer: Self-pay | Admitting: Family Medicine

## 2011-10-08 DIAGNOSIS — I10 Essential (primary) hypertension: Secondary | ICD-10-CM

## 2011-10-08 MED ORDER — HYDROCHLOROTHIAZIDE 25 MG PO TABS: 25.0000 mg | ORAL_TABLET | Freq: Every day | ORAL | Status: DC

## 2011-10-08 NOTE — Telephone Encounter (Signed)
Refill for Hydrochlorothiazide 25 MG Tab Qty 90 Take 1-tablet by mouth every day  Last filled 3.9.13

## 2011-10-21 ENCOUNTER — Encounter: Payer: Self-pay | Admitting: Internal Medicine

## 2011-11-11 ENCOUNTER — Ambulatory Visit (HOSPITAL_COMMUNITY)
Admission: RE | Admit: 2011-11-11 | Discharge: 2011-11-11 | Disposition: A | Discharge status: Home / Self Care | Payer: BC Managed Care – PPO | Source: Ambulatory Visit | Attending: Family Medicine | Admitting: Family Medicine

## 2011-11-11 DIAGNOSIS — Z13820 Encounter for screening for osteoporosis: Secondary | ICD-10-CM | POA: Insufficient documentation

## 2011-11-11 DIAGNOSIS — Z780 Asymptomatic menopausal state: Secondary | ICD-10-CM | POA: Insufficient documentation

## 2011-11-28 ENCOUNTER — Encounter: Payer: Self-pay | Admitting: Family Medicine

## 2012-01-01 ENCOUNTER — Encounter: Payer: BC Managed Care – PPO | Admitting: Internal Medicine

## 2012-02-03 ENCOUNTER — Other Ambulatory Visit: Payer: Self-pay | Admitting: Family Medicine

## 2012-04-08 ENCOUNTER — Other Ambulatory Visit: Payer: Self-pay | Admitting: Family Medicine

## 2012-04-13 ENCOUNTER — Other Ambulatory Visit (INDEPENDENT_AMBULATORY_CARE_PROVIDER_SITE_OTHER): Payer: BC Managed Care – PPO

## 2012-04-13 DIAGNOSIS — E785 Hyperlipidemia, unspecified: Secondary | ICD-10-CM

## 2012-04-13 LAB — LIPID PANEL
Cholesterol: 138 mg/dL (ref 0–200)
HDL: 54.8 mg/dL (ref >39.00)
Triglycerides: 110 mg/dL (ref 0.0–149.0)

## 2012-04-13 LAB — HEPATIC FUNCTION PANEL
AST: 25 U/L (ref 0–37)
Albumin: 4.2 g/dL (ref 3.5–5.2)
Alkaline Phosphatase: 61 U/L (ref 39–117)
Total Protein: 7 g/dL (ref 6.0–8.3)

## 2012-04-15 ENCOUNTER — Other Ambulatory Visit: Payer: Self-pay | Admitting: Family Medicine

## 2012-04-15 DIAGNOSIS — I10 Essential (primary) hypertension: Secondary | ICD-10-CM

## 2012-04-15 NOTE — Telephone Encounter (Signed)
refill Hydrochlorothiazide (Tab) 25 MG Take 1 tablet (25 mg total) by mouth daily  #90 wt/1-refill -- last fill 07.09.13  Last ov 3.12.13-V70 --also note lipid/Hep labs done 10.14.13

## 2012-04-16 ENCOUNTER — Other Ambulatory Visit: Payer: Self-pay | Admitting: Family Medicine

## 2012-04-16 MED ORDER — HYDROCHLOROTHIAZIDE 25 MG PO TABS: 25.0000 mg | ORAL_TABLET | Freq: Every day | ORAL | Status: DC

## 2012-05-11 ENCOUNTER — Other Ambulatory Visit: Payer: Self-pay | Admitting: Family Medicine

## 2012-07-03 ENCOUNTER — Encounter: Payer: Self-pay | Admitting: Family Medicine

## 2012-07-03 ENCOUNTER — Ambulatory Visit (INDEPENDENT_AMBULATORY_CARE_PROVIDER_SITE_OTHER): Payer: BC Managed Care – PPO | Admitting: Family Medicine

## 2012-07-03 VITALS — BP 140/60 | HR 90 | Temp 98.0°F | Ht 63.5 in | Wt 212.4 lb

## 2012-07-03 DIAGNOSIS — J209 Acute bronchitis, unspecified: Secondary | ICD-10-CM | POA: Insufficient documentation

## 2012-07-03 MED ORDER — AZITHROMYCIN 250 MG PO TABS: ORAL_TABLET | ORAL | Status: DC

## 2012-07-03 MED ORDER — GUAIFENESIN-CODEINE 100-10 MG/5ML PO SYRP: 10.0000 mL | ORAL_SOLUTION | Freq: Three times a day (TID) | ORAL | Status: DC | PRN

## 2012-07-03 NOTE — Progress Notes (Signed)
  Subjective:    Patient ID: Heather Pierce, female    DOB: December 18, 1950, 62 y.o.   MRN: [id number]  HPI URI- no fever, no chills.  sxs started 1 week ago w/ sore throat.  + head congestion, chest congestion.  Has tried OTC nyquil, advil cough/cold w/out relief.  Cough is productive in the AM.  No facial pain.  + swelling under eyes bilaterally.  No ear pain.  N/V.  + sick contacts.   Review of Systems For ROS see HPI     Objective:   Physical Exam  Vitals reviewed. Constitutional: She appears well-developed and well-nourished. No distress.  HENT:  Head: Normocephalic and atraumatic.       TMs normal bilaterally Mild nasal congestion Throat w/out erythema, edema, or exudate  Eyes: Conjunctivae normal and EOM are normal. Pupils are equal, round, and reactive to light.  Neck: Normal range of motion. Neck supple.  Cardiovascular: Normal rate, regular rhythm, normal heart sounds and intact distal pulses.   No murmur heard. Pulmonary/Chest: Effort normal and breath sounds normal. No respiratory distress. She has no wheezes.       + hacking cough  Lymphadenopathy:    She has no cervical adenopathy.          Assessment & Plan:

## 2012-07-03 NOTE — Assessment & Plan Note (Signed)
Pt's duration of illness and sxs consistent w/ infxn.  Start abx.  Cough meds prn.  Reviewed supportive care and red flags that should prompt return.  Pt expressed understanding and is in agreement w/ plan.

## 2012-07-03 NOTE — Patient Instructions (Addendum)
This is a bronchitis Start the Zpack as directed Use the cough syrup as needed- will make you sleepy Take Mucinex DM for daytime cough (won't cause drowsiness) REST! Drink plenty of fluids Hang in there!

## 2012-08-05 ENCOUNTER — Other Ambulatory Visit: Payer: Self-pay | Admitting: Family Medicine

## 2012-08-05 DIAGNOSIS — Z1231 Encounter for screening mammogram for malignant neoplasm of breast: Secondary | ICD-10-CM

## 2012-08-15 ENCOUNTER — Other Ambulatory Visit: Payer: Self-pay

## 2012-09-07 ENCOUNTER — Ambulatory Visit (HOSPITAL_BASED_OUTPATIENT_CLINIC_OR_DEPARTMENT_OTHER)
Admission: RE | Admit: 2012-09-07 | Discharge: 2012-09-07 | Disposition: A | Discharge status: Home / Self Care | Payer: BC Managed Care – PPO | Source: Ambulatory Visit | Attending: Family Medicine | Admitting: Family Medicine

## 2012-09-07 DIAGNOSIS — Z1231 Encounter for screening mammogram for malignant neoplasm of breast: Secondary | ICD-10-CM | POA: Insufficient documentation

## 2012-09-23 ENCOUNTER — Encounter: Payer: BC Managed Care – PPO | Admitting: Family Medicine

## 2012-10-05 ENCOUNTER — Other Ambulatory Visit: Payer: Self-pay | Admitting: Family Medicine

## 2012-10-22 ENCOUNTER — Ambulatory Visit (INDEPENDENT_AMBULATORY_CARE_PROVIDER_SITE_OTHER): Payer: BC Managed Care – PPO | Admitting: Family Medicine

## 2012-10-22 ENCOUNTER — Encounter: Payer: Self-pay | Admitting: Family Medicine

## 2012-10-22 VITALS — BP 128/80 | HR 72 | Temp 98.6°F | Wt 207.8 lb

## 2012-10-22 DIAGNOSIS — H18829 Corneal disorder due to contact lens, unspecified eye: Secondary | ICD-10-CM

## 2012-10-22 DIAGNOSIS — H18821 Corneal disorder due to contact lens, right eye: Secondary | ICD-10-CM

## 2012-10-22 MED ORDER — MOXIFLOXACIN HCL 0.5 % OP SOLN: 1.0000 [drp] | Freq: Three times a day (TID) | OPHTHALMIC | Status: DC

## 2012-10-22 NOTE — Patient Instructions (Signed)
Corneal Abrasion  The cornea is the clear covering at the front and center of the eye. When looking at the colored portion (iris) of the eye, you are looking through that person's cornea.   This very thin tissue is made up of many layers. The surface layer is a single layer of cells called the corneal epithelium. This is one of the most sensitive tissues in the body. If a scratch or injury causes the corneal epithelium to come off, it is called a corneal abrasion. If the injury extends to the tissues below the epithelium, the condition is called a corneal ulcer.   CAUSES    Scratches.   Trauma.   Foreign body in the eye.   Some people have recurrences of abrasions in the area of the original injury even after they heal. This is called recurrent erosion syndrome. Recurrent erosion syndromes generally improve and go away with time.  SYMPTOMS    Eye pain.   Difficulty or inability to keep the injured eye open.   The eye becomes very sensitive to light.   Recurrent erosions tend to happen suddenly, first thing in the morning  usually upon awakening and opening the eyes.  DIAGNOSIS   Your eye professional can diagnose a corneal abrasion during an eye exam. Dye is usually placed in the eye using a drop or a small paper strip moistened by the patient's tears. When the eye is examined with a special light, the abrasion shows up clearly because of the dye.  TREATMENT    Small abrasions may be treated with antibiotic drops or ointment alone.   Usually a pressure patch is specially applied. Pressure patches prevent the eye from blinking, allowing the corneal epithelium to heal. Because blinking is less, a pressure patch also reduces the amount of pain present in the eye during healing. Most corneal abrasions heal within 2-3 days with no effect on vision. WARNING: Do not drive or operate machinery while your eye is patched. Your ability to judge distances is impaired.   If abrasion becomes infected and spreads to the  deeper tissues of the cornea, a corneal ulcer can result. This is serious because it can cause corneal scarring. Corneal scars interfere with light passing through the cornea, and cause a loss of vision in the involved eye.   If your caregiver has given you a follow-up appointment, it is very important to keep that appointment. Not keeping the appointment could result in a severe eye infection or permanent loss of vision. If there is any problem keeping the appointment, you must call back to this facility for assistance.  SEEK MEDICAL CARE IF:    You have pain, light sensitivity and a scratchy feeling in one eye (or both).   Your pressure patch keeps loosening up and you can blink your eye under the patch after treatment.   Any kind of discharge develops from the involved eye after treatment or if the lids stick together in the morning.   You have the same symptoms in the morning as you did with the original abrasion days, weeks or months after the abrasion healed.  MAKE SURE YOU:    Understand these instructions.   Will watch your condition.   Will get help right away if you are not doing well or get worse.  Document Released: 06/14/2000 Document Revised: 09/09/2011 Document Reviewed: 01/21/2008  [employer] Patient Information 2013 [employer].

## 2012-10-22 NOTE — Progress Notes (Signed)
  Subjective:    Heather Pierce is a 62 y.o. female who presents for evaluation of blurred vision, discharge, erythema and itching in the right eye. She has noticed the above symptoms for 3 days. Onset was sudden. Patient denies foreign body sensation, pain, photophobia and tearing. There is a history of wearing glasses.  The following portions of the patient's history were reviewed and updated as appropriate: allergies, current medications, past family history, past medical history, past social history, past surgical history and problem list.  Review of Systems Pertinent items are noted in HPI.   Objective:    BP 128/80  Pulse 72  Temp(Src) 98.6 F (37 C) (Oral)  Wt 207 lb 12.8 oz (94.257 kg)  BMI 36.23 kg/m2  SpO2 97%  LMP 09/24/2005      General: alert, cooperative, appears stated age and no distress  Eyes:  positive findings: eyelids/periorbital: normal, conjunctiva: negative subconjunctival hemorrhage and sclera injected  Vision: Not performed  Fluorescein:  positive uptake L eye medially     Assessment:    Corneal abrasion   Plan:    Discussed the diagnosis and proper care of conjunctivitis.  Stressed household Presenter, broadcasting. Ophthalmic drops per orders. Warm compress to eye(s). Local eye care discussed.

## 2012-11-01 ENCOUNTER — Other Ambulatory Visit: Payer: Self-pay | Admitting: Family Medicine

## 2012-11-12 ENCOUNTER — Encounter: Payer: Self-pay | Admitting: Family Medicine

## 2012-11-12 ENCOUNTER — Encounter: Payer: Self-pay | Admitting: Internal Medicine

## 2012-11-12 ENCOUNTER — Ambulatory Visit (INDEPENDENT_AMBULATORY_CARE_PROVIDER_SITE_OTHER): Payer: BC Managed Care – PPO | Admitting: Family Medicine

## 2012-11-12 VITALS — BP 130/78 | HR 85 | Temp 99.0°F | Ht 63.0 in | Wt 205.4 lb

## 2012-11-12 DIAGNOSIS — Z0000 Encounter for general adult medical examination without abnormal findings: Secondary | ICD-10-CM

## 2012-11-12 DIAGNOSIS — I10 Essential (primary) hypertension: Secondary | ICD-10-CM

## 2012-11-12 DIAGNOSIS — Z2911 Encounter for prophylactic immunotherapy for respiratory syncytial virus (RSV): Secondary | ICD-10-CM

## 2012-11-12 DIAGNOSIS — E785 Hyperlipidemia, unspecified: Secondary | ICD-10-CM

## 2012-11-12 LAB — POCT URINALYSIS DIPSTICK
Bilirubin, UA: NEGATIVE
Glucose, UA: NEGATIVE
Ketones, UA: NEGATIVE
Leukocytes, UA: NEGATIVE
pH, UA: 6

## 2012-11-12 LAB — CBC WITH DIFFERENTIAL/PLATELET
Eosinophils Absolute: 0.1 10*3/uL (ref 0.0–0.7)
MCHC: 34.4 g/dL (ref 30.0–36.0)
MCV: 85.4 fl (ref 78.0–100.0)
Monocytes Absolute: 0.4 10*3/uL (ref 0.1–1.0)
Neutrophils Relative %: 56.7 % (ref 43.0–77.0)
Platelets: 174 10*3/uL (ref 150.0–400.0)

## 2012-11-12 LAB — LIPID PANEL
Cholesterol: 151 mg/dL (ref 0–200)
HDL: 55 mg/dL (ref >39.00)
Total CHOL/HDL Ratio: 3
Triglycerides: 121 mg/dL (ref 0.0–149.0)

## 2012-11-12 LAB — BASIC METABOLIC PANEL
BUN: 14 mg/dL (ref 6–23)
CO2: 30 mEq/L (ref 19–32)
Chloride: 104 mEq/L (ref 96–112)
Creatinine, Ser: 0.9 mg/dL (ref 0.4–1.2)
Glucose, Bld: 93 mg/dL (ref 70–99)

## 2012-11-12 LAB — TSH: TSH: 0.71 u[IU]/mL (ref 0.35–5.50)

## 2012-11-12 LAB — HEPATIC FUNCTION PANEL
Bilirubin, Direct: 0.1 mg/dL (ref 0.0–0.3)
Total Bilirubin: 0.7 mg/dL (ref 0.3–1.2)
Total Protein: 6.7 g/dL (ref 6.0–8.3)

## 2012-11-12 NOTE — Assessment & Plan Note (Signed)
Stable con't meds 

## 2012-11-12 NOTE — Progress Notes (Signed)
Subjective:     Heather Pierce is a 62 y.o. female and is here for a comprehensive physical exam. The patient reports no problems.  History   Social History  . Marital Status: Single    Spouse Name: N/A    Number of Children: N/A  . Years of Education: N/A   Occupational History  . 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  .     Social History Main Topics  . Smoking status: Never Smoker   . Smokeless tobacco: Never Used  . Alcohol Use: Yes     Comment: rare  . Drug Use: No  . Sexually Active: No   Other Topics Concern  . Not on file   Social History Narrative   Exercise-- no   Health Maintenance  Topic Date Due  . Colonoscopy  03/13/2009  . Influenza Vaccine  03/01/2013  . Pap Smear  09/24/2013  . Mammogram  09/08/2014  . Tetanus/tdap  04/19/2019  . Zostavax  Completed    The following portions of the patient's history were reviewed and updated as appropriate:  She  has a past medical history of Hypertension; GERD (gastroesophageal reflux disease); and Hyperlipidemia. She  does not have any pertinent problems on file. She  has no past surgical history on file. Her family history includes Alzheimer's disease in her father and mother; Arthritis in an unspecified family member; COPD in her father; Cancer in her father; Coronary artery disease in an unspecified family member; Dementia in her father and mother; GER disease in her father; Hyperlipidemia in an unspecified family member; Hypertension in an unspecified family member; Jaundice in her father; Melanoma in an unspecified family member; and Osteoarthritis in her father. She  reports that she has never smoked. She has never used smokeless tobacco. She reports that  drinks alcohol. She reports that she does not use illicit drugs. She has a current medication list which includes the following prescription(s): hydrochlorothiazide, loratadine, and simvastatin. Current Outpatient Prescriptions on File Prior to Visit  Medication  Sig Dispense Refill  . hydrochlorothiazide (HYDRODIURIL) 25 MG tablet TAKE 1 TABLET (25 MG TOTAL) BY MOUTH DAILY.  90 tablet  0  . simvastatin (ZOCOR) 40 MG tablet 1 tab by mouth at bedtime---repeat labs are due now  30 tablet  0   No current facility-administered medications on file prior to visit.   She has No Known Allergies..  Review of Systems Review of Systems  Constitutional: Negative for activity change, appetite change and fatigue.  HENT: Negative for hearing loss, congestion, tinnitus and ear discharge.  dentist q1y eyes: Negative for visual disturbance (see optho q1y -- vision corrected to 20/20 with glasses).  Respiratory: Negative for cough, chest tightness and shortness of breath.   Cardiovascular: Negative for chest pain, palpitations and leg swelling.  Gastrointestinal: Negative for abdominal pain, diarrhea, constipation and abdominal distention.  Genitourinary: Negative for urgency, frequency, decreased urine volume and difficulty urinating.  Musculoskeletal: Negative for back pain, arthralgias and gait problem.  Skin: Negative for color change, pallor and rash.  Neurological: Negative for dizziness, light-headedness, numbness and headaches.  Hematological: Negative for adenopathy. Does not bruise/bleed easily.  Psychiatric/Behavioral: Negative for suicidal ideas, confusion, sleep disturbance, self-injury, dysphoric mood, decreased concentration and agitation.       Objective:    BP 130/78  Pulse 85  Temp(Src) 99 F (37.2 C) (Oral)  Ht 5\' 3"  (1.6 m)  Wt 205 lb 6.4 oz (93.169 kg)  BMI 36.39 kg/m2  SpO2 96%  LMP 09/24/2005 General appearance: alert, cooperative, appears stated age and no distress Head: Normocephalic, without obvious abnormality, atraumatic Eyes: negative findings: lids and lashes normal, conjunctivae and sclerae normal, corneas clear and pupils equal, round, reactive to light and accomodation Ears: normal TM's and external ear canals both  ears Nose: Nares normal. Septum midline. Mucosa normal. No drainage or sinus tenderness. Throat: lips, mucosa, and tongue normal; teeth and gums normal Neck: no adenopathy, no carotid bruit, no JVD, supple, symmetrical, trachea midline and thyroid not enlarged, symmetric, no tenderness/mass/nodules Back: symmetric, no curvature. ROM normal. No CVA tenderness. Lungs: clear to auscultation bilaterally Breasts: normal appearance, no masses or tenderness Heart: regular rate and rhythm, S1, S2 normal, no murmur, click, rub or gallop Abdomen: soft, non-tender; bowel sounds normal; no masses,  no organomegaly Pelvic: deferred---pt prefers to put off until next year Extremities: extremities normal, atraumatic, no cyanosis or edema Pulses: 2+ and symmetric Skin: Skin color, texture, turgor normal. No rashes or lesions Lymph nodes: Cervical, supraclavicular, and axillary nodes normal. Neurologic: Alert and oriented X 3, normal strength and tone. Normal symmetric reflexes. Normal coordination and gait Psych-- no depression, no anxiety      Assessment:    Healthy female exam.      Plan:    ghm utd Check labs See After Visit Summary for Counseling Recommendations

## 2012-11-12 NOTE — Assessment & Plan Note (Addendum)
Check labs  con't zocor 

## 2012-11-12 NOTE — Patient Instructions (Addendum)
Preventive Care for Adults, Female A healthy lifestyle and preventive care can promote health and wellness. Preventive health guidelines for women include the following key practices.  A routine yearly physical is a good way to check with your caregiver about your health and preventive screening. It is a chance to share any concerns and updates on your health, and to receive a thorough exam.  Visit your dentist for a routine exam and preventive care every 6 months. Brush your teeth twice a day and floss once a day. Good oral hygiene prevents tooth decay and gum disease.  The frequency of eye exams is based on your age, health, family medical history, use of contact lenses, and other factors. Follow your caregiver's recommendations for frequency of eye exams.  Eat a healthy diet. Foods like vegetables, fruits, whole grains, low-fat dairy products, and lean protein foods contain the nutrients you need without too many calories. Decrease your intake of foods high in solid fats, added sugars, and salt. Eat the right amount of calories for you.Get information about a proper diet from your caregiver, if necessary.  Regular physical exercise is one of the most important things you can do for your health. Most adults should get at least 150 minutes of moderate-intensity exercise (any activity that increases your heart rate and causes you to sweat) each week. In addition, most adults need muscle-strengthening exercises on 2 or more days a week.  Maintain a healthy weight. The body mass index (BMI) is a screening tool to identify possible weight problems. It provides an estimate of body fat based on height and weight. Your caregiver can help determine your BMI, and can help you achieve or maintain a healthy weight.For adults 20 years and older:  A BMI below 18.5 is considered underweight.  A BMI of 18.5 to 24.9 is normal.  A BMI of 25 to 29.9 is considered overweight.  A BMI of 30 and above is  considered obese.  Maintain normal blood lipids and cholesterol levels by exercising and minimizing your intake of saturated fat. Eat a balanced diet with plenty of fruit and vegetables. Blood tests for lipids and cholesterol should begin at age 20 and be repeated every 5 years. If your lipid or cholesterol levels are high, you are over 50, or you are at high risk for heart disease, you may need your cholesterol levels checked more frequently.Ongoing high lipid and cholesterol levels should be treated with medicines if diet and exercise are not effective.  If you smoke, find out from your caregiver how to quit. If you do not use tobacco, do not start.  If you are pregnant, do not drink alcohol. If you are breastfeeding, be very cautious about drinking alcohol. If you are not pregnant and choose to drink alcohol, do not exceed 1 drink per day. One drink is considered to be 12 ounces (355 mL) of beer, 5 ounces (148 mL) of wine, or 1.5 ounces (44 mL) of liquor.  Avoid use of street drugs. Do not share needles with anyone. Ask for help if you need support or instructions about stopping the use of drugs.  High blood pressure causes heart disease and increases the risk of stroke. Your blood pressure should be checked at least every 1 to 2 years. Ongoing high blood pressure should be treated with medicines if weight loss and exercise are not effective.  If you are 55 to 62 years old, ask your caregiver if you should take aspirin to prevent strokes.  Diabetes   screening involves taking a blood sample to check your fasting blood sugar level. This should be done once every 3 years, after age 45, if you are within normal weight and without risk factors for diabetes. Testing should be considered at a younger age or be carried out more frequently if you are overweight and have at least 1 risk factor for diabetes.  Breast cancer screening is essential preventive care for women. You should practice "breast  self-awareness." This means understanding the normal appearance and feel of your breasts and may include breast self-examination. Any changes detected, no matter how small, should be reported to a caregiver. Women in their 20s and 30s should have a clinical breast exam (CBE) by a caregiver as part of a regular health exam every 1 to 3 years. After age 40, women should have a CBE every year. Starting at age 40, women should consider having a mammography (breast X-ray test) every year. Women who have a family history of breast cancer should talk to their caregiver about genetic screening. Women at a high risk of breast cancer should talk to their caregivers about having magnetic resonance imaging (MRI) and a mammography every year.  The Pap test is a screening test for cervical cancer. A Pap test can show cell changes on the cervix that might become cervical cancer if left untreated. A Pap test is a procedure in which cells are obtained and examined from the lower end of the uterus (cervix).  Women should have a Pap test starting at age 21.  Between ages 21 and 29, Pap tests should be repeated every 2 years.  Beginning at age 30, you should have a Pap test every 3 years as long as the past 3 Pap tests have been normal.  Some women have medical problems that increase the chance of getting cervical cancer. Talk to your caregiver about these problems. It is especially important to talk to your caregiver if a new problem develops soon after your last Pap test. In these cases, your caregiver may recommend more frequent screening and Pap tests.  The above recommendations are the same for women who have or have not gotten the vaccine for human papillomavirus (HPV).  If you had a hysterectomy for a problem that was not cancer or a condition that could lead to cancer, then you no longer need Pap tests. Even if you no longer need a Pap test, a regular exam is a good idea to make sure no other problems are  starting.  If you are between ages 65 and 70, and you have had normal Pap tests going back 10 years, you no longer need Pap tests. Even if you no longer need a Pap test, a regular exam is a good idea to make sure no other problems are starting.  If you have had past treatment for cervical cancer or a condition that could lead to cancer, you need Pap tests and screening for cancer for at least 20 years after your treatment.  If Pap tests have been discontinued, risk factors (such as a new sexual partner) need to be reassessed to determine if screening should be resumed.  The HPV test is an additional test that may be used for cervical cancer screening. The HPV test looks for the virus that can cause the cell changes on the cervix. The cells collected during the Pap test can be tested for HPV. The HPV test could be used to screen women aged 30 years and older, and should   be used in women of any age who have unclear Pap test results. After the age of 30, women should have HPV testing at the same frequency as a Pap test.  Colorectal cancer can be detected and often prevented. Most routine colorectal cancer screening begins at the age of 50 and continues through age 75. However, your caregiver may recommend screening at an earlier age if you have risk factors for colon cancer. On a yearly basis, your caregiver may provide home test kits to check for hidden blood in the stool. Use of a small camera at the end of a tube, to directly examine the colon (sigmoidoscopy or colonoscopy), can detect the earliest forms of colorectal cancer. Talk to your caregiver about this at age 50, when routine screening begins. Direct examination of the colon should be repeated every 5 to 10 years through age 75, unless early forms of pre-cancerous polyps or small growths are found.  Hepatitis C blood testing is recommended for all people born from 1945 through 1965 and any individual with known risks for hepatitis C.  Practice  safe sex. Use condoms and avoid high-risk sexual practices to reduce the spread of sexually transmitted infections (STIs). STIs include gonorrhea, chlamydia, syphilis, trichomonas, herpes, HPV, and human immunodeficiency virus (HIV). Herpes, HIV, and HPV are viral illnesses that have no cure. They can result in disability, cancer, and death. Sexually active women aged 25 and younger should be checked for chlamydia. Older women with new or multiple partners should also be tested for chlamydia. Testing for other STIs is recommended if you are sexually active and at increased risk.  Osteoporosis is a disease in which the bones lose minerals and strength with aging. This can result in serious bone fractures. The risk of osteoporosis can be identified using a bone density scan. Women ages 65 and over and women at risk for fractures or osteoporosis should discuss screening with their caregivers. Ask your caregiver whether you should take a calcium supplement or vitamin D to reduce the rate of osteoporosis.  Menopause can be associated with physical symptoms and risks. Hormone replacement therapy is available to decrease symptoms and risks. You should talk to your caregiver about whether hormone replacement therapy is right for you.  Use sunscreen with sun protection factor (SPF) of 30 or more. Apply sunscreen liberally and repeatedly throughout the day. You should seek shade when your shadow is shorter than you. Protect yourself by wearing long sleeves, pants, a wide-brimmed hat, and sunglasses year round, whenever you are outdoors.  Once a month, do a whole body skin exam, using a mirror to look at the skin on your back. Notify your caregiver of new moles, moles that have irregular borders, moles that are larger than a pencil eraser, or moles that have changed in shape or color.  Stay current with required immunizations.  Influenza. You need a dose every fall (or winter). The composition of the flu vaccine  changes each year, so being vaccinated once is not enough.  Pneumococcal polysaccharide. You need 1 to 2 doses if you smoke cigarettes or if you have certain chronic medical conditions. You need 1 dose at age 65 (or older) if you have never been vaccinated.  Tetanus, diphtheria, pertussis (Tdap, Td). Get 1 dose of Tdap vaccine if you are younger than age 65, are over 65 and have contact with an infant, are a healthcare worker, are pregnant, or simply want to be protected from whooping cough. After that, you need a Td   booster dose every 10 years. Consult your caregiver if you have not had at least 3 tetanus and diphtheria-containing shots sometime in your life or have a deep or dirty wound.  HPV. You need this vaccine if you are a woman age 26 or younger. The vaccine is given in 3 doses over 6 months.  Measles, mumps, rubella (MMR). You need at least 1 dose of MMR if you were born in 1957 or later. You may also need a second dose.  Meningococcal. If you are age 19 to 21 and a first-year college student living in a residence hall, or have one of several medical conditions, you need to get vaccinated against meningococcal disease. You may also need additional booster doses.  Zoster (shingles). If you are age 60 or older, you should get this vaccine.  Varicella (chickenpox). If you have never had chickenpox or you were vaccinated but received only 1 dose, talk to your caregiver to find out if you need this vaccine.  Hepatitis A. You need this vaccine if you have a specific risk factor for hepatitis A virus infection or you simply wish to be protected from this disease. The vaccine is usually given as 2 doses, 6 to 18 months apart.  Hepatitis B. You need this vaccine if you have a specific risk factor for hepatitis B virus infection or you simply wish to be protected from this disease. The vaccine is given in 3 doses, usually over 6 months. Preventive Services / Frequency Ages 19 to 39  Blood  pressure check.** / Every 1 to 2 years.  Lipid and cholesterol check.** / Every 5 years beginning at age 20.  Clinical breast exam.** / Every 3 years for women in their 20s and 30s.  Pap test.** / Every 2 years from ages 21 through 29. Every 3 years starting at age 30 through age 65 or 70 with a history of 3 consecutive normal Pap tests.  HPV screening.** / Every 3 years from ages 30 through ages 65 to 70 with a history of 3 consecutive normal Pap tests.  Hepatitis C blood test.** / For any individual with known risks for hepatitis C.  Skin self-exam. / Monthly.  Influenza immunization.** / Every year.  Pneumococcal polysaccharide immunization.** / 1 to 2 doses if you smoke cigarettes or if you have certain chronic medical conditions.  Tetanus, diphtheria, pertussis (Tdap, Td) immunization. / A one-time dose of Tdap vaccine. After that, you need a Td booster dose every 10 years.  HPV immunization. / 3 doses over 6 months, if you are 26 and younger.  Measles, mumps, rubella (MMR) immunization. / You need at least 1 dose of MMR if you were born in 1957 or later. You may also need a second dose.  Meningococcal immunization. / 1 dose if you are age 19 to 21 and a first-year college student living in a residence hall, or have one of several medical conditions, you need to get vaccinated against meningococcal disease. You may also need additional booster doses.  Varicella immunization.** / Consult your caregiver.  Hepatitis A immunization.** / Consult your caregiver. 2 doses, 6 to 18 months apart.  Hepatitis B immunization.** / Consult your caregiver. 3 doses usually over 6 months. Ages 40 to 64  Blood pressure check.** / Every 1 to 2 years.  Lipid and cholesterol check.** / Every 5 years beginning at age 20.  Clinical breast exam.** / Every year after age 40.  Mammogram.** / Every year beginning at age 40   and continuing for as long as you are in good health. Consult with your  caregiver.  Pap test.** / Every 3 years starting at age 30 through age 65 or 70 with a history of 3 consecutive normal Pap tests.  HPV screening.** / Every 3 years from ages 30 through ages 65 to 70 with a history of 3 consecutive normal Pap tests.  Fecal occult blood test (FOBT) of stool. / Every year beginning at age 50 and continuing until age 75. You may not need to do this test if you get a colonoscopy every 10 years.  Flexible sigmoidoscopy or colonoscopy.** / Every 5 years for a flexible sigmoidoscopy or every 10 years for a colonoscopy beginning at age 50 and continuing until age 75.  Hepatitis C blood test.** / For all people born from 1945 through 1965 and any individual with known risks for hepatitis C.  Skin self-exam. / Monthly.  Influenza immunization.** / Every year.  Pneumococcal polysaccharide immunization.** / 1 to 2 doses if you smoke cigarettes or if you have certain chronic medical conditions.  Tetanus, diphtheria, pertussis (Tdap, Td) immunization.** / A one-time dose of Tdap vaccine. After that, you need a Td booster dose every 10 years.  Measles, mumps, rubella (MMR) immunization. / You need at least 1 dose of MMR if you were born in 1957 or later. You may also need a second dose.  Varicella immunization.** / Consult your caregiver.  Meningococcal immunization.** / Consult your caregiver.  Hepatitis A immunization.** / Consult your caregiver. 2 doses, 6 to 18 months apart.  Hepatitis B immunization.** / Consult your caregiver. 3 doses, usually over 6 months. Ages 65 and over  Blood pressure check.** / Every 1 to 2 years.  Lipid and cholesterol check.** / Every 5 years beginning at age 20.  Clinical breast exam.** / Every year after age 40.  Mammogram.** / Every year beginning at age 40 and continuing for as long as you are in good health. Consult with your caregiver.  Pap test.** / Every 3 years starting at age 30 through age 65 or 70 with a 3  consecutive normal Pap tests. Testing can be stopped between 65 and 70 with 3 consecutive normal Pap tests and no abnormal Pap or HPV tests in the past 10 years.  HPV screening.** / Every 3 years from ages 30 through ages 65 or 70 with a history of 3 consecutive normal Pap tests. Testing can be stopped between 65 and 70 with 3 consecutive normal Pap tests and no abnormal Pap or HPV tests in the past 10 years.  Fecal occult blood test (FOBT) of stool. / Every year beginning at age 50 and continuing until age 75. You may not need to do this test if you get a colonoscopy every 10 years.  Flexible sigmoidoscopy or colonoscopy.** / Every 5 years for a flexible sigmoidoscopy or every 10 years for a colonoscopy beginning at age 50 and continuing until age 75.  Hepatitis C blood test.** / For all people born from 1945 through 1965 and any individual with known risks for hepatitis C.  Osteoporosis screening.** / A one-time screening for women ages 65 and over and women at risk for fractures or osteoporosis.  Skin self-exam. / Monthly.  Influenza immunization.** / Every year.  Pneumococcal polysaccharide immunization.** / 1 dose at age 65 (or older) if you have never been vaccinated.  Tetanus, diphtheria, pertussis (Tdap, Td) immunization. / A one-time dose of Tdap vaccine if you are over   65 and have contact with an infant, are a healthcare worker, or simply want to be protected from whooping cough. After that, you need a Td booster dose every 10 years.  Varicella immunization.** / Consult your caregiver.  Meningococcal immunization.** / Consult your caregiver.  Hepatitis A immunization.** / Consult your caregiver. 2 doses, 6 to 18 months apart.  Hepatitis B immunization.** / Check with your caregiver. 3 doses, usually over 6 months. ** Family history and personal history of risk and conditions may change your caregiver's recommendations. Document Released: 08/13/2001 Document Revised: 09/09/2011  Document Reviewed: 11/12/2010 [employer] Patient Information 2013 [employer].  

## 2012-12-11 ENCOUNTER — Other Ambulatory Visit: Payer: Self-pay | Admitting: Family Medicine

## 2012-12-30 ENCOUNTER — Ambulatory Visit (AMBULATORY_SURGERY_CENTER): Payer: BC Managed Care – PPO | Admitting: *Deleted

## 2012-12-30 VITALS — Ht 63.0 in | Wt 209.0 lb

## 2012-12-30 DIAGNOSIS — Z1211 Encounter for screening for malignant neoplasm of colon: Secondary | ICD-10-CM

## 2012-12-30 MED ORDER — MOVIPREP 100 G PO SOLR: ORAL | Status: DC

## 2012-12-31 ENCOUNTER — Encounter: Payer: Self-pay | Admitting: Internal Medicine

## 2013-01-07 ENCOUNTER — Other Ambulatory Visit: Payer: Self-pay | Admitting: Family Medicine

## 2013-01-10 ENCOUNTER — Other Ambulatory Visit: Payer: Self-pay | Admitting: Family Medicine

## 2013-01-15 ENCOUNTER — Encounter: Payer: Self-pay | Admitting: Internal Medicine

## 2013-01-15 ENCOUNTER — Ambulatory Visit (AMBULATORY_SURGERY_CENTER): Payer: BC Managed Care – PPO | Admitting: Internal Medicine

## 2013-01-15 VITALS — BP 134/80 | HR 70 | Temp 98.4°F | Resp 12 | Ht 63.0 in | Wt 209.0 lb

## 2013-01-15 DIAGNOSIS — Z1211 Encounter for screening for malignant neoplasm of colon: Secondary | ICD-10-CM

## 2013-01-15 MED ORDER — SODIUM CHLORIDE 0.9 % IV SOLN: 500.0000 mL | INTRAVENOUS | Status: DC

## 2013-01-15 NOTE — Progress Notes (Signed)
Patient did not experience any of the following events: a burn prior to discharge; a fall within the facility; wrong site/side/patient/procedure/implant event; or a hospital transfer or hospital admission upon discharge from the facility. (G8907) Patient did not have preoperative order for IV antibiotic SSI prophylaxis. (G8918)  

## 2013-01-15 NOTE — Patient Instructions (Addendum)
Discharge instructions given with verbal understanding. Normal exam. Resume previous medications. YOU HAD AN ENDOSCOPIC PROCEDURE TODAY AT THE [facility]: Refer to the procedure report that was given to you for any specific questions about what was found during the examination.  If the procedure report does not answer your questions, please call your gastroenterologist to clarify.  If you requested that your care partner not be given the details of your procedure findings, then the procedure report has been included in a sealed envelope for you to review at your convenience later.  YOU SHOULD EXPECT: Some feelings of bloating in the abdomen. Passage of more gas than usual.  Walking can help get rid of the air that was put into your GI tract during the procedure and reduce the bloating. If you had a lower endoscopy (such as a colonoscopy or flexible sigmoidoscopy) you may notice spotting of blood in your stool or on the toilet paper. If you underwent a bowel prep for your procedure, then you may not have a normal bowel movement for a few days.  DIET: Your first meal following the procedure should be a light meal and then it is ok to progress to your normal diet.  A half-sandwich or bowl of soup is an example of a good first meal.  Heavy or fried foods are harder to digest and may make you feel nauseous or bloated.  Likewise meals heavy in dairy and vegetables can cause extra gas to form and this can also increase the bloating.  Drink plenty of fluids but you should avoid alcoholic beverages for 24 hours.  ACTIVITY: Your care partner should take you home directly after the procedure.  You should plan to take it easy, moving slowly for the rest of the day.  You can resume normal activity the day after the procedure however you should NOT DRIVE or use heavy machinery for 24 hours (because of the sedation medicines used during the test).    SYMPTOMS TO REPORT IMMEDIATELY: A gastroenterologist  can be reached at any hour.  During normal business hours, 8:30 AM to 5:00 PM Monday through Friday, call [phone].  After hours and on weekends, please call the GI answering service at [phone] who will take a message and have the physician on call contact you.   Following lower endoscopy (colonoscopy or flexible sigmoidoscopy):  Excessive amounts of blood in the stool  Significant tenderness or worsening of abdominal pains  Swelling of the abdomen that is new, acute  Fever of 100F or higher  FOLLOW UP: If any biopsies were taken you will be contacted by phone or by letter within the next 1-3 weeks.  Call your gastroenterologist if you have not heard about the biopsies in 3 weeks.  Our staff will call the home number listed on your records the next business day following your procedure to check on you and address any questions or concerns that you may have at that time regarding the information given to you following your procedure. This is a courtesy call and so if there is no answer at the home number and we have not heard from you through the emergency physician on call, we will assume that you have returned to your regular daily activities without incident.  SIGNATURES/CONFIDENTIALITY: You and/or your care partner have signed paperwork which will be entered into your electronic medical record.  These signatures attest to the fact that that the information above on your After Visit Summary has been reviewed   and is understood.  Full responsibility of the confidentiality of this discharge information lies with you and/or your care-partner. 

## 2013-01-15 NOTE — Op Note (Signed)
[facility] [address] [place], [address]   COLONOSCOPY PROCEDURE REPORT  PATIENT: Heather Pierce, Heather Pierce  MR#: [id number] BIRTHDATE: [birth year] , 62  yrs. old GENDER: Female ENDOSCOPIST: Hart Carwin, MD REFERRED BY:  Loreen Freud, DO PROCEDURE DATE:  01/15/2013 PROCEDURE:   Colonoscopy, screening ASA CLASS:   Class II INDICATIONS:Average risk patient for colon cancer and normal colonoscopy in 1998. MEDICATIONS: MAC sedation, administered by CRNA and propofol (Diprivan) 200mg  IV  DESCRIPTION OF PROCEDURE:   After the risks and benefits and of the procedure were explained, informed consent was obtained.  A digital rectal exam revealed no abnormalities of the rectum.    The LB [id number]  endoscope was introduced through the anus and advanced to the cecum, which was identified by both the appendix and ileocecal valve .  The quality of the prep was good, using MoviPrep .  The instrument was then slowly withdrawn as the colon was fully examined.     COLON FINDINGS: A normal appearing cecum, ileocecal valve, and appendiceal orifice were identified.  The ascending, hepatic flexure, transverse, splenic flexure, descending, sigmoid colon and rectum appeared unremarkable.  No polyps or cancers were seen. Retroflexed views revealed no abnormalities.     The scope was then withdrawn from the patient and the procedure completed.  COMPLICATIONS: There were no complications. ENDOSCOPIC IMPRESSION: Normal colon  RECOMMENDATIONS: High fiber diet   REPEAT EXAM: In 10 year(s)  for Colonoscopy.  cc:  _______________________________ eSignedHart Carwin, MD 01/15/2013 9:01 AM

## 2013-01-18 ENCOUNTER — Telehealth: Payer: Self-pay

## 2013-01-18 NOTE — Telephone Encounter (Signed)
  Follow up Call-  Call back number 01/15/2013  Post procedure Call Back phone  # [phone]  Permission to leave phone message Yes     Patient questions:  Do you have a fever, pain , or abdominal swelling? no Pain Score  0 *  Have you tolerated food without any problems? yes  Have you been able to return to your normal activities? yes  Do you have any questions about your discharge instructions: Diet   no Medications  no Follow up visit  no  Do you have questions or concerns about your Care? no  Actions: * If pain score is 4 or above: No action needed, pain <4.

## 2013-05-06 ENCOUNTER — Other Ambulatory Visit: Payer: Self-pay

## 2013-05-22 ENCOUNTER — Encounter: Payer: Self-pay | Admitting: Family Medicine

## 2013-06-06 ENCOUNTER — Other Ambulatory Visit: Payer: Self-pay | Admitting: Family Medicine

## 2013-07-10 ENCOUNTER — Other Ambulatory Visit: Payer: Self-pay | Admitting: Family Medicine

## 2013-07-15 ENCOUNTER — Encounter: Payer: Self-pay | Admitting: Family Medicine

## 2013-07-15 ENCOUNTER — Ambulatory Visit (INDEPENDENT_AMBULATORY_CARE_PROVIDER_SITE_OTHER): Payer: BC Managed Care – PPO | Admitting: Family Medicine

## 2013-07-15 VITALS — BP 146/82 | HR 74 | Temp 98.5°F | Wt 210.4 lb

## 2013-07-15 DIAGNOSIS — E785 Hyperlipidemia, unspecified: Secondary | ICD-10-CM

## 2013-07-15 DIAGNOSIS — I10 Essential (primary) hypertension: Secondary | ICD-10-CM

## 2013-07-15 DIAGNOSIS — E669 Obesity, unspecified: Secondary | ICD-10-CM | POA: Insufficient documentation

## 2013-07-15 LAB — LIPID PANEL
CHOL/HDL RATIO: 2
Cholesterol: 138 mg/dL (ref 0–200)
HDL: 56 mg/dL (ref >39.00)
LDL CALC: 56 mg/dL (ref 0–99)
Triglycerides: 130 mg/dL (ref 0.0–149.0)
VLDL: 26 mg/dL (ref 0.0–40.0)

## 2013-07-15 LAB — HEPATIC FUNCTION PANEL
ALBUMIN: 4.3 g/dL (ref 3.5–5.2)
ALK PHOS: 74 U/L (ref 39–117)
ALT: 30 U/L (ref 0–35)
AST: 27 U/L (ref 0–37)
BILIRUBIN DIRECT: 0.1 mg/dL (ref 0.0–0.3)
BILIRUBIN TOTAL: 0.8 mg/dL (ref 0.3–1.2)
Total Protein: 7 g/dL (ref 6.0–8.3)

## 2013-07-15 LAB — BASIC METABOLIC PANEL
BUN: 14 mg/dL (ref 6–23)
CALCIUM: 9.2 mg/dL (ref 8.4–10.5)
CO2: 29 mEq/L (ref 19–32)
Chloride: 103 mEq/L (ref 96–112)
Creatinine, Ser: 0.9 mg/dL (ref 0.4–1.2)
GFR: 64.01 mL/min (ref >60.00)
GLUCOSE: 90 mg/dL (ref 70–99)
POTASSIUM: 3.8 meq/L (ref 3.5–5.1)
SODIUM: 139 meq/L (ref 135–145)

## 2013-07-15 NOTE — Patient Instructions (Signed)

## 2013-07-15 NOTE — Progress Notes (Signed)
  Subjective:    Patient here for follow-up of elevated blood pressure.  She is not exercising and is adherent to a low-salt diet.  Blood pressure is well controlled at home. Cardiac symptoms: none. Patient denies: chest pain, chest pressure/discomfort, claudication, dyspnea, exertional chest pressure/discomfort, fatigue, irregular heart beat, lower extremity edema, near-syncope, orthopnea, palpitations, paroxysmal nocturnal dyspnea, syncope and tachypnea. Cardiovascular risk factors: dyslipidemia, hypertension, obesity (BMI >= 30 kg/m2) and sedentary lifestyle. Use of agents associated with hypertension: none. History of target organ damage: none.  The following portions of the patient's history were reviewed and updated as appropriate: allergies, current medications, past family history, past medical history, past social history, past surgical history and problem list.  Review of Systems Pertinent items are noted in HPI.     Objective:    BP 146/82  Pulse 74  Temp(Src) 98.5 F (36.9 C) (Oral)  Wt 210 lb 6.4 oz (95.437 kg)  SpO2 98%  LMP 09/24/2005 General appearance: alert, cooperative, appears stated age and no distress Throat: lips, mucosa, and tongue normal; teeth and gums normal Neck: no adenopathy, supple, symmetrical, trachea midline and thyroid not enlarged, symmetric, no tenderness/mass/nodules Lungs: clear to auscultation bilaterally Heart: S1, S2 normal Extremities: extremities normal, atraumatic, no cyanosis or edema    Assessment:    Hypertension, normal blood pressure . Evidence of target organ damage: none.    Plan:    Medication: no change. Dietary sodium restriction. Regular aerobic exercise. Check blood pressures 2-3 times weekly and record. Follow up: 6 months and as needed.

## 2013-07-15 NOTE — Progress Notes (Signed)
Pre visit review using our clinic review tool, if applicable. No additional management support is needed unless otherwise documented below in the visit note. 

## 2013-07-15 NOTE — Assessment & Plan Note (Signed)
con't meds  Check labs 

## 2013-08-04 ENCOUNTER — Telehealth: Payer: Self-pay | Admitting: Family Medicine

## 2013-08-04 NOTE — Telephone Encounter (Signed)
Relevant patient education assigned to patient using Emmi. ° °

## 2013-08-10 ENCOUNTER — Other Ambulatory Visit: Payer: Self-pay | Admitting: Family Medicine

## 2013-09-09 ENCOUNTER — Other Ambulatory Visit: Payer: Self-pay | Admitting: Family Medicine

## 2013-09-09 DIAGNOSIS — Z1231 Encounter for screening mammogram for malignant neoplasm of breast: Secondary | ICD-10-CM

## 2013-09-20 ENCOUNTER — Encounter: Payer: Self-pay | Admitting: Internal Medicine

## 2013-09-20 ENCOUNTER — Ambulatory Visit (INDEPENDENT_AMBULATORY_CARE_PROVIDER_SITE_OTHER): Payer: BC Managed Care – PPO | Admitting: Internal Medicine

## 2013-09-20 VITALS — BP 144/85 | HR 105 | Temp 98.1°F | Wt 207.0 lb

## 2013-09-20 DIAGNOSIS — J069 Acute upper respiratory infection, unspecified: Secondary | ICD-10-CM

## 2013-09-20 MED ORDER — AZELASTINE HCL 0.1 % NA SOLN: 2.0000 | Freq: Two times a day (BID) | NASAL | Status: DC | PRN

## 2013-09-20 MED ORDER — AZITHROMYCIN 250 MG PO TABS: ORAL_TABLET | ORAL | Status: DC

## 2013-09-20 NOTE — Patient Instructions (Signed)
Rest, fluids , tylenol For cough, take Mucinex DM twice a day as needed or a OTC of choice For congestion use ASTELIN  2 nasal sprays on each side of the nose twice a day as needed until you feel better  Take the antibiotic as prescribed  (zithromax ) only if no better in 3-4 days  Call if no better in few days Call anytime if the symptoms are severe, you have high fever, short of breath, chest pain

## 2013-09-20 NOTE — Progress Notes (Signed)
Pre visit review using our clinic review tool, if applicable. No additional management support is needed unless otherwise documented below in the visit note. 

## 2013-09-20 NOTE — Progress Notes (Signed)
   Subjective:    Patient ID: Heather Pierce, female    DOB: 1951/04/04, 63 y.o.   MRN: [id number]  DOS:  09/20/2013 Type of  visit: Acute visit   Patient came back from [place] after a vacation 5 days ago, shortly after developed cough, on and off, mostly dry, relatively well controlled with OTCs. Last night she developed a temperature of 100.7 and chills. She is concerned about a fever. Admits to mild headaches but no chest congestion or chest symptoms.  ROS Mild sore throat. Clear nasal discharge, small amounts. No myalgias Denies chest pain, difficulty breathing or lower extremity edema  Past Medical History  Diagnosis Date  . Hypertension   . GERD (gastroesophageal reflux disease)   . Hyperlipidemia   . Allergy     Past Surgical History  Procedure Laterality Date  . Knee arthroscopy  11/2009    left     History   Social History  . Marital Status: Single    Spouse Name: N/A    Number of Children: N/A  . Years of Education: N/A   Occupational History  . Passenger transport manager     [employer]  .     Social History Main Topics  . Smoking status: Never Smoker   . Smokeless tobacco: Never Used  . Alcohol Use: Yes     Comment: occ. maybe 2 drinks per month per pt.  . Drug Use: No  . Sexual Activity: No   Other Topics Concern  . Not on file   Social History Narrative   Exercise-- no        Medication List       This list is accurate as of: 09/20/13  1:05 PM.  Always use your most recent med list.               azelastine 137 MCG/SPRAY nasal spray  Commonly known as:  ASTELIN  Place 2 sprays into both nostrils 2 (two) times daily as needed for rhinitis.     azithromycin 250 MG tablet  Commonly known as:  ZITHROMAX Z-PAK  As directed     hydrochlorothiazide 25 MG tablet  Commonly known as:  HYDRODIURIL  TAKE 1 TABLET (25 MG TOTAL) BY MOUTH DAILY.     simvastatin 40 MG tablet  Commonly known as:  ZOCOR  Take 1 tablet (40 mg total) by mouth at  bedtime.           Objective:   Physical Exam BP 144/85  Pulse 105  Temp(Src) 98.1 F (36.7 C) (Oral)  Wt 207 lb (93.895 kg)  SpO2 95%  LMP 09/24/2005  General -- alert, well-developed, NAD. Nontoxic appearing HEENT-- Not pale. TMs normal, throat symmetric, no redness or discharge. Face symmetric, sinuses not tender to palpation. Nose  congested. Lungs -- normal respiratory effort, no intercostal retractions, no accessory muscle use, and normal breath sounds.  Heart-- normal rate, regular rhythm, no murmur.   Extremities-- no pretibial edema bilaterally  Neurologic--  alert & oriented X3. Speech normal, gait normal, strength normal in all extremities.  Psych-- Cognition and judgment appear intact. Cooperative with normal attention span and concentration. No anxious or depressed appearing.     Assessment & Plan:  URI, 63 year old lady with symptoms consistent with URI, recently came back from [place], over there she stayed in urban areas only. Plan: see Instructions, because her recent travel  history she she is strongly recommend to come back to the office if she's not improving

## 2013-09-24 ENCOUNTER — Ambulatory Visit (HOSPITAL_BASED_OUTPATIENT_CLINIC_OR_DEPARTMENT_OTHER): Payer: BC Managed Care – PPO

## 2013-09-29 ENCOUNTER — Ambulatory Visit (HOSPITAL_BASED_OUTPATIENT_CLINIC_OR_DEPARTMENT_OTHER): Payer: BC Managed Care – PPO

## 2013-10-08 ENCOUNTER — Ambulatory Visit (HOSPITAL_BASED_OUTPATIENT_CLINIC_OR_DEPARTMENT_OTHER)
Admission: RE | Admit: 2013-10-08 | Discharge: 2013-10-08 | Disposition: A | Discharge status: Home / Self Care | Payer: BC Managed Care – PPO | Source: Ambulatory Visit | Attending: Family Medicine | Admitting: Family Medicine

## 2013-10-08 DIAGNOSIS — Z1231 Encounter for screening mammogram for malignant neoplasm of breast: Secondary | ICD-10-CM | POA: Insufficient documentation

## 2013-10-08 IMAGING — MG MM DIGITAL SCREENING
4 series · 4 of 4 positions shown · non-contrast
Comparison: Previous exam(s).

CLINICAL DATA: Screening.

EXAM:
DIGITAL SCREENING BILATERAL MAMMOGRAM WITH CAD

[R CC]
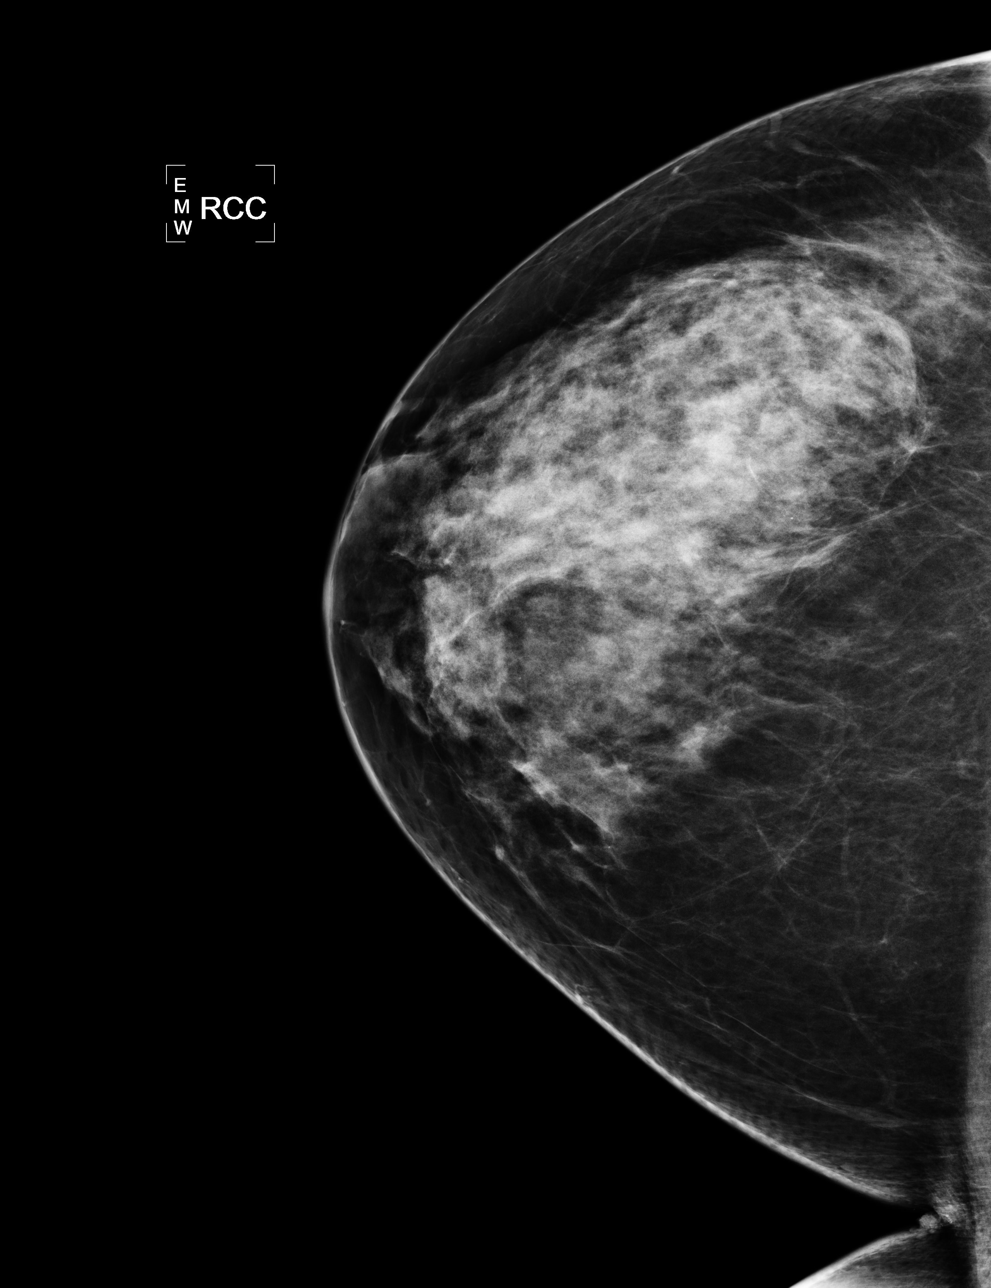

[L CC]
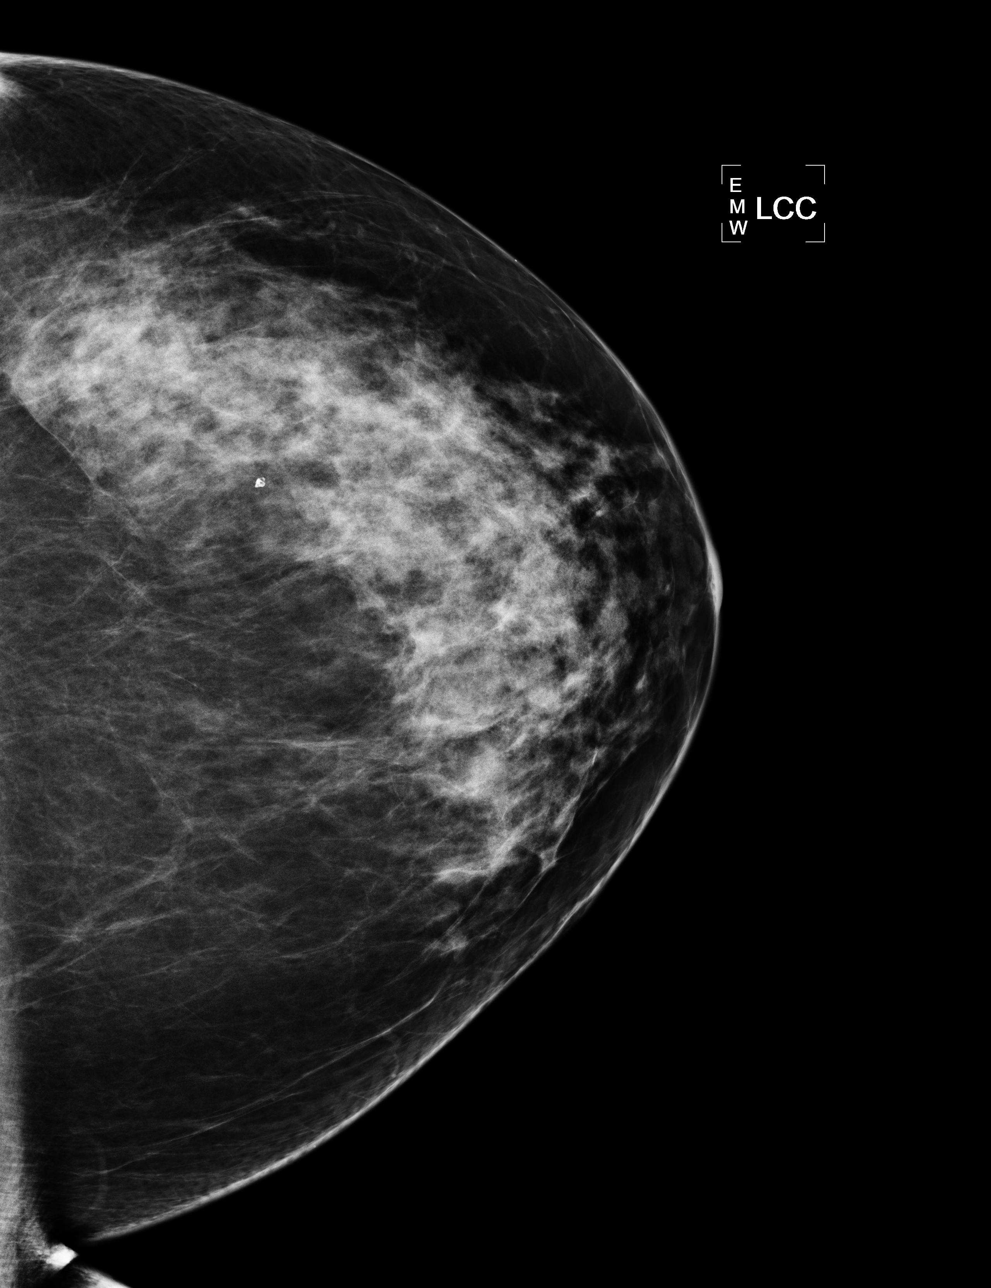

[L MLO]
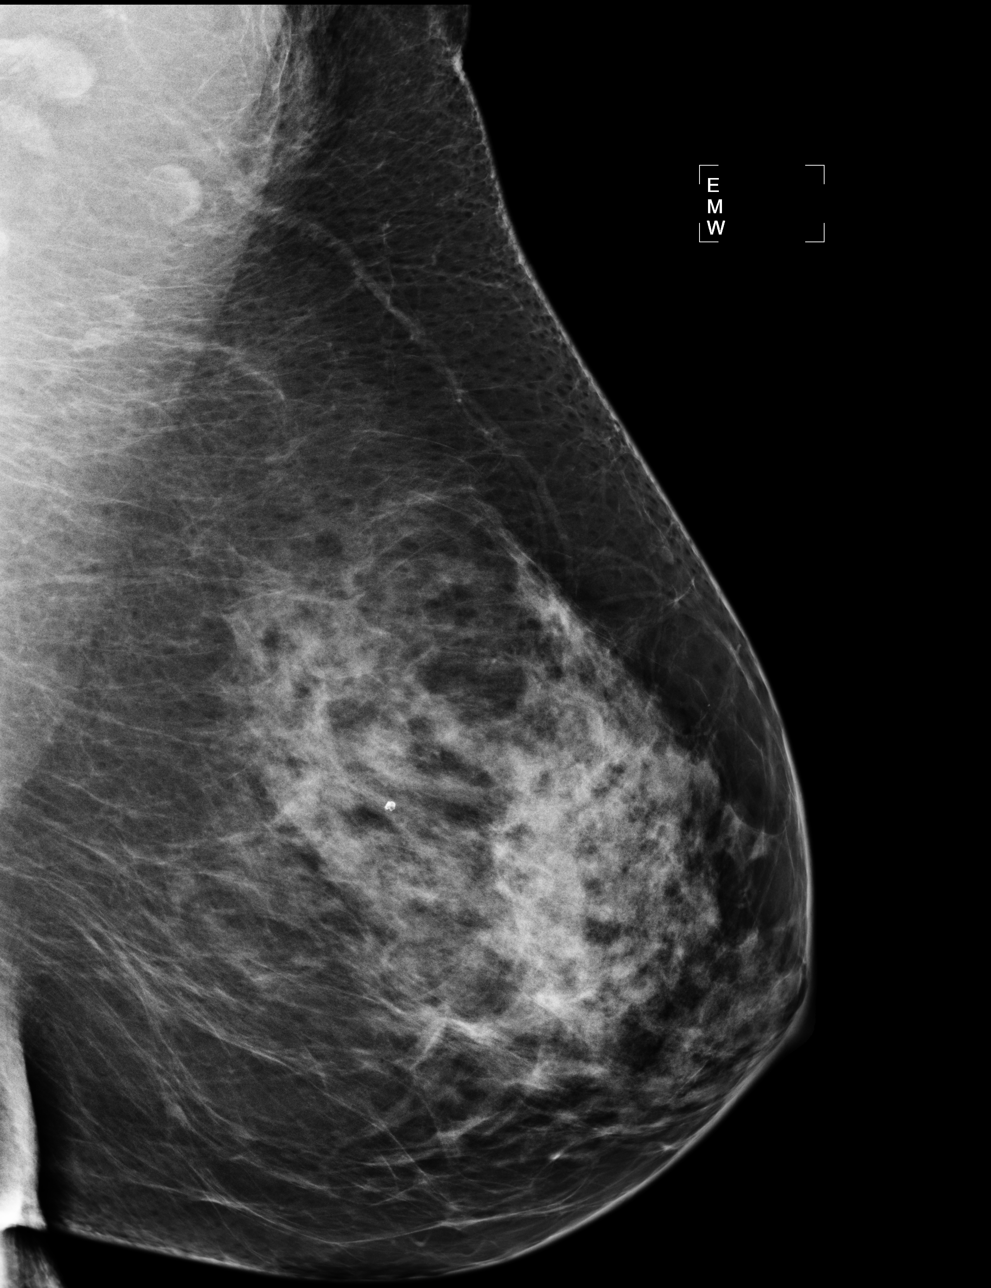

[R MLO]
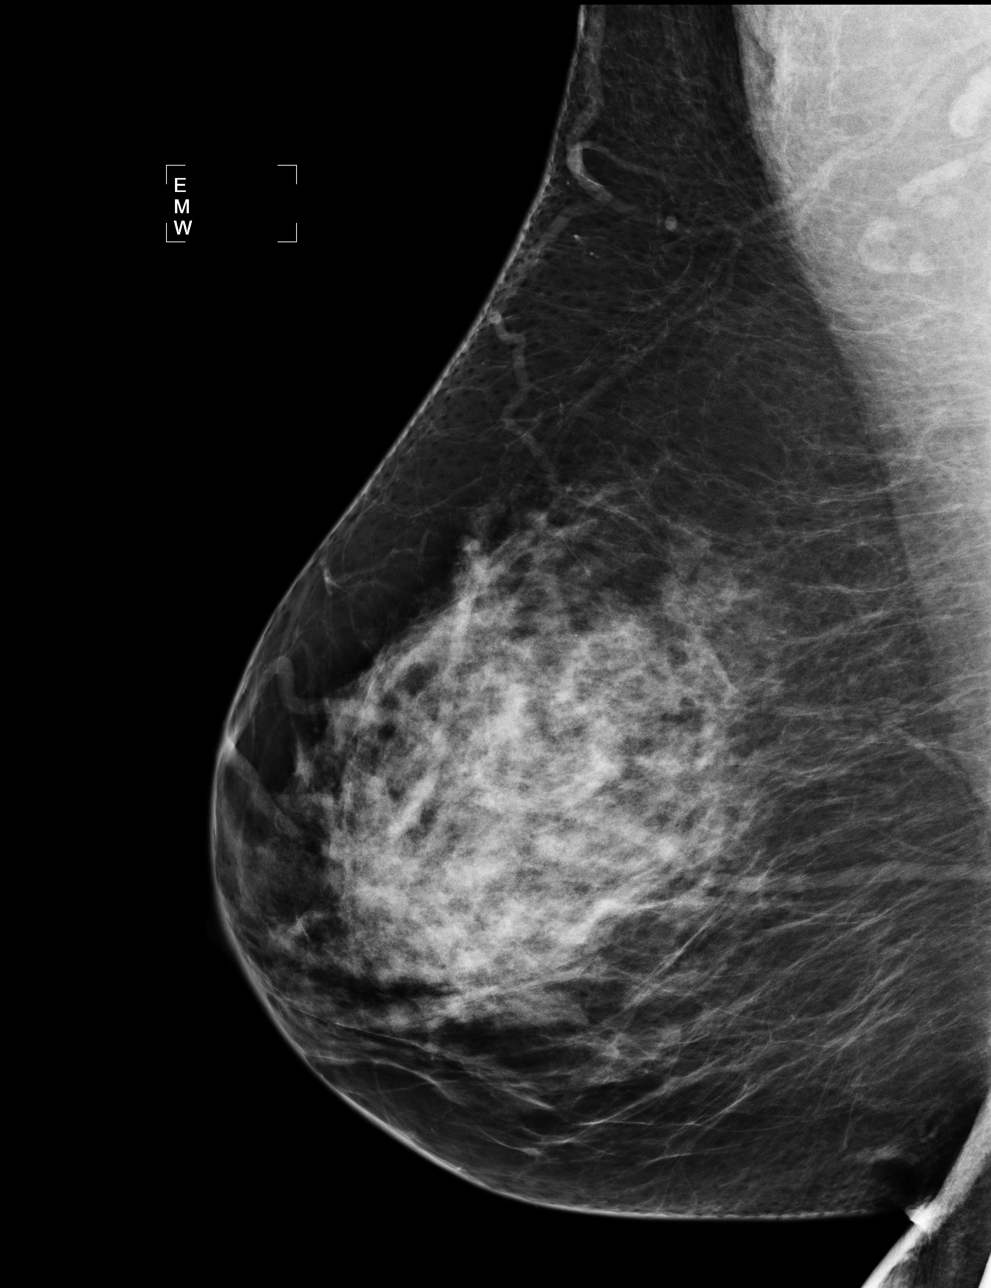

[4 of 4 positions shown; findings below may reference images not displayed]

ACR Breast Density Category d: The breast tissue is extremely dense,
which lowers the sensitivity of mammography.
FINDINGS: There are no findings suspicious for malignancy. Images were
processed with CAD.
IMPRESSION: No mammographic evidence of malignancy. A result letter of this
screening mammogram will be mailed directly to the patient.

RECOMMENDATION:
Screening mammogram in one year. (Code:[ZH])

BI-RADS CATEGORY  1: Negative.

## 2013-11-15 ENCOUNTER — Encounter: Payer: BC Managed Care – PPO | Admitting: Family Medicine

## 2014-01-02 ENCOUNTER — Other Ambulatory Visit: Payer: Self-pay | Admitting: Family Medicine

## 2014-01-21 ENCOUNTER — Encounter: Payer: BC Managed Care – PPO | Admitting: Family Medicine

## 2014-01-31 ENCOUNTER — Other Ambulatory Visit: Payer: Self-pay | Admitting: Family Medicine

## 2014-03-04 ENCOUNTER — Other Ambulatory Visit: Payer: Self-pay | Admitting: Family Medicine

## 2014-03-23 ENCOUNTER — Encounter: Payer: BC Managed Care – PPO | Admitting: Family Medicine

## 2014-03-28 ENCOUNTER — Encounter: Payer: Self-pay | Admitting: Family Medicine

## 2014-03-28 ENCOUNTER — Ambulatory Visit (INDEPENDENT_AMBULATORY_CARE_PROVIDER_SITE_OTHER): Payer: BC Managed Care – PPO | Admitting: Family Medicine

## 2014-03-28 VITALS — BP 142/88 | HR 83 | Temp 97.5°F | Ht 64.25 in | Wt 206.6 lb

## 2014-03-28 DIAGNOSIS — E785 Hyperlipidemia, unspecified: Secondary | ICD-10-CM

## 2014-03-28 DIAGNOSIS — Z0000 Encounter for general adult medical examination without abnormal findings: Secondary | ICD-10-CM

## 2014-03-28 LAB — HEPATIC FUNCTION PANEL
ALBUMIN: 4.5 g/dL (ref 3.5–5.2)
ALT: 31 U/L (ref 0–35)
AST: 29 U/L (ref 0–37)
Alkaline Phosphatase: 69 U/L (ref 39–117)
BILIRUBIN DIRECT: 0.1 mg/dL (ref 0.0–0.3)
TOTAL PROTEIN: 7.3 g/dL (ref 6.0–8.3)
Total Bilirubin: 0.8 mg/dL (ref 0.2–1.2)

## 2014-03-28 LAB — BASIC METABOLIC PANEL
BUN: 10 mg/dL (ref 6–23)
CHLORIDE: 101 meq/L (ref 96–112)
CO2: 28 mEq/L (ref 19–32)
Calcium: 9.4 mg/dL (ref 8.4–10.5)
Creatinine, Ser: 1 mg/dL (ref 0.4–1.2)
GFR: 63.09 mL/min (ref >60.00)
Glucose, Bld: 96 mg/dL (ref 70–99)
Potassium: 3.5 mEq/L (ref 3.5–5.1)
Sodium: 138 mEq/L (ref 135–145)

## 2014-03-28 LAB — LIPID PANEL
CHOL/HDL RATIO: 3
Cholesterol: 151 mg/dL (ref 0–200)
HDL: 56.9 mg/dL (ref >39.00)
LDL Cholesterol: 64 mg/dL (ref 0–99)
NONHDL: 94.1
TRIGLYCERIDES: 151 mg/dL — AB (ref 0.0–149.0)
VLDL: 30.2 mg/dL (ref 0.0–40.0)

## 2014-03-28 LAB — CBC WITH DIFFERENTIAL/PLATELET
Basophils Absolute: 0 10*3/uL (ref 0.0–0.1)
Basophils Relative: 0.3 % (ref 0.0–3.0)
EOS ABS: 0.1 10*3/uL (ref 0.0–0.7)
EOS PCT: 1.8 % (ref 0.0–5.0)
HCT: 40.7 % (ref 36.0–46.0)
HEMOGLOBIN: 13.7 g/dL (ref 12.0–15.0)
Lymphocytes Relative: 26.3 % (ref 12.0–46.0)
Lymphs Abs: 1.6 10*3/uL (ref 0.7–4.0)
MCHC: 33.6 g/dL (ref 30.0–36.0)
MCV: 86.9 fl (ref 78.0–100.0)
MONO ABS: 0.4 10*3/uL (ref 0.1–1.0)
Monocytes Relative: 6.2 % (ref 3.0–12.0)
NEUTROS ABS: 3.9 10*3/uL (ref 1.4–7.7)
Neutrophils Relative %: 65.4 % (ref 43.0–77.0)
Platelets: 178 10*3/uL (ref 150.0–400.0)
RBC: 4.68 Mil/uL (ref 3.87–5.11)
RDW: 12.8 % (ref 11.5–15.5)
WBC: 6 10*3/uL (ref 4.0–10.5)

## 2014-03-28 LAB — POCT URINALYSIS DIPSTICK
Bilirubin, UA: NEGATIVE
Blood, UA: NEGATIVE
Glucose, UA: NEGATIVE
Ketones, UA: NEGATIVE
Leukocytes, UA: NEGATIVE
NITRITE UA: NEGATIVE
Protein, UA: NEGATIVE
SPEC GRAV UA: 1.025
UROBILINOGEN UA: 0.2
pH, UA: 6

## 2014-03-28 LAB — TSH: TSH: 1.02 u[IU]/mL (ref 0.35–4.50)

## 2014-03-28 NOTE — Progress Notes (Signed)
Pre visit review using our clinic review tool, if applicable. No additional management support is needed unless otherwise documented below in the visit note. 

## 2014-03-28 NOTE — Patient Instructions (Signed)
Preventive Care for Adults A healthy lifestyle and preventive care can promote health and wellness. Preventive health guidelines for women include the following key practices.  A routine yearly physical is a good way to check with your health care provider about your health and preventive screening. It is a chance to share any concerns and updates on your health and to receive a thorough exam.  Visit your dentist for a routine exam and preventive care every 6 months. Brush your teeth twice a day and floss once a day. Good oral hygiene prevents tooth decay and gum disease.  The frequency of eye exams is based on your age, health, family medical history, use of contact lenses, and other factors. Follow your health care provider's recommendations for frequency of eye exams.  Eat a healthy diet. Foods like vegetables, fruits, whole grains, low-fat dairy products, and lean protein foods contain the nutrients you need without too many calories. Decrease your intake of foods high in solid fats, added sugars, and salt. Eat the right amount of calories for you.Get information about a proper diet from your health care provider, if necessary.  Regular physical exercise is one of the most important things you can do for your health. Most adults should get at least 150 minutes of moderate-intensity exercise (any activity that increases your heart rate and causes you to sweat) each week. In addition, most adults need muscle-strengthening exercises on 2 or more days a week.  Maintain a healthy weight. The body mass index (BMI) is a screening tool to identify possible weight problems. It provides an estimate of body fat based on height and weight. Your health care provider can find your BMI and can help you achieve or maintain a healthy weight.For adults 20 years and older:  A BMI below 18.5 is considered underweight.  A BMI of 18.5 to 24.9 is normal.  A BMI of 25 to 29.9 is considered overweight.  A BMI of  30 and above is considered obese.  Maintain normal blood lipids and cholesterol levels by exercising and minimizing your intake of saturated fat. Eat a balanced diet with plenty of fruit and vegetables. Blood tests for lipids and cholesterol should begin at age 76 and be repeated every 5 years. If your lipid or cholesterol levels are high, you are over 50, or you are at high risk for heart disease, you may need your cholesterol levels checked more frequently.Ongoing high lipid and cholesterol levels should be treated with medicines if diet and exercise are not working.  If you smoke, find out from your health care provider how to quit. If you do not use tobacco, do not start.  Lung cancer screening is recommended for adults aged 22-80 years who are at high risk for developing lung cancer because of a history of smoking. A yearly low-dose CT scan of the lungs is recommended for people who have at least a 30-pack-year history of smoking and are a current smoker or have quit within the past 15 years. A pack year of smoking is smoking an average of 1 pack of cigarettes a day for 1 year (for example: 1 pack a day for 30 years or 2 packs a day for 15 years). Yearly screening should continue until the smoker has stopped smoking for at least 15 years. Yearly screening should be stopped for people who develop a health problem that would prevent them from having lung cancer treatment.  If you are pregnant, do not drink alcohol. If you are breastfeeding,  be very cautious about drinking alcohol. If you are not pregnant and choose to drink alcohol, do not have more than 1 drink per day. One drink is considered to be 12 ounces (355 mL) of beer, 5 ounces (148 mL) of wine, or 1.5 ounces (44 mL) of liquor.  Avoid use of street drugs. Do not share needles with anyone. Ask for help if you need support or instructions about stopping the use of drugs.  High blood pressure causes heart disease and increases the risk of  stroke. Your blood pressure should be checked at least every 1 to 2 years. Ongoing high blood pressure should be treated with medicines if weight loss and exercise do not work.  If you are 75-52 years old, ask your health care provider if you should take aspirin to prevent strokes.  Diabetes screening involves taking a blood sample to check your fasting blood sugar level. This should be done once every 3 years, after age 15, if you are within normal weight and without risk factors for diabetes. Testing should be considered at a younger age or be carried out more frequently if you are overweight and have at least 1 risk factor for diabetes.  Breast cancer screening is essential preventive care for women. You should practice "breast self-awareness." This means understanding the normal appearance and feel of your breasts and may include breast self-examination. Any changes detected, no matter how small, should be reported to a health care provider. Women in their 58s and 30s should have a clinical breast exam (CBE) by a health care provider as part of a regular health exam every 1 to 3 years. After age 16, women should have a CBE every year. Starting at age 53, women should consider having a mammogram (breast X-ray test) every year. Women who have a family history of breast cancer should talk to their health care provider about genetic screening. Women at a high risk of breast cancer should talk to their health care providers about having an MRI and a mammogram every year.  Breast cancer gene (BRCA)-related cancer risk assessment is recommended for women who have family members with BRCA-related cancers. BRCA-related cancers include breast, ovarian, tubal, and peritoneal cancers. Having family members with these cancers may be associated with an increased risk for harmful changes (mutations) in the breast cancer genes BRCA1 and BRCA2. Results of the assessment will determine the need for genetic counseling and  BRCA1 and BRCA2 testing.  Routine pelvic exams to screen for cancer are no longer recommended for nonpregnant women who are considered low risk for cancer of the pelvic organs (ovaries, uterus, and vagina) and who do not have symptoms. Ask your health care provider if a screening pelvic exam is right for you.  If you have had past treatment for cervical cancer or a condition that could lead to cancer, you need Pap tests and screening for cancer for at least 20 years after your treatment. If Pap tests have been discontinued, your risk factors (such as having a new sexual partner) need to be reassessed to determine if screening should be resumed. Some women have medical problems that increase the chance of getting cervical cancer. In these cases, your health care provider may recommend more frequent screening and Pap tests.  The HPV test is an additional test that may be used for cervical cancer screening. The HPV test looks for the virus that can cause the cell changes on the cervix. The cells collected during the Pap test can be  tested for HPV. The HPV test could be used to screen women aged 30 years and older, and should be used in women of any age who have unclear Pap test results. After the age of 30, women should have HPV testing at the same frequency as a Pap test.  Colorectal cancer can be detected and often prevented. Most routine colorectal cancer screening begins at the age of 50 years and continues through age 75 years. However, your health care provider may recommend screening at an earlier age if you have risk factors for colon cancer. On a yearly basis, your health care provider may provide home test kits to check for hidden blood in the stool. Use of a small camera at the end of a tube, to directly examine the colon (sigmoidoscopy or colonoscopy), can detect the earliest forms of colorectal cancer. Talk to your health care provider about this at age 50, when routine screening begins. Direct  exam of the colon should be repeated every 5-10 years through age 75 years, unless early forms of pre-cancerous polyps or small growths are found.  People who are at an increased risk for hepatitis B should be screened for this virus. You are considered at high risk for hepatitis B if:  You were born in a country where hepatitis B occurs often. Talk with your health care provider about which countries are considered high risk.  Your parents were born in a high-risk country and you have not received a shot to protect against hepatitis B (hepatitis B vaccine).  You have HIV or AIDS.  You use needles to inject street drugs.  You live with, or have sex with, someone who has hepatitis B.  You get hemodialysis treatment.  You take certain medicines for conditions like cancer, organ transplantation, and autoimmune conditions.  Hepatitis C blood testing is recommended for all people born from 1945 through 1965 and any individual with known risks for hepatitis C.  Practice safe sex. Use condoms and avoid high-risk sexual practices to reduce the spread of sexually transmitted infections (STIs). STIs include gonorrhea, chlamydia, syphilis, trichomonas, herpes, HPV, and human immunodeficiency virus (HIV). Herpes, HIV, and HPV are viral illnesses that have no cure. They can result in disability, cancer, and death.  You should be screened for sexually transmitted illnesses (STIs) including gonorrhea and chlamydia if:  You are sexually active and are younger than 24 years.  You are older than 24 years and your health care provider tells you that you are at risk for this type of infection.  Your sexual activity has changed since you were last screened and you are at an increased risk for chlamydia or gonorrhea. Ask your health care provider if you are at risk.  If you are at risk of being infected with HIV, it is recommended that you take a prescription medicine daily to prevent HIV infection. This is  called preexposure prophylaxis (PrEP). You are considered at risk if:  You are a heterosexual woman, are sexually active, and are at increased risk for HIV infection.  You take drugs by injection.  You are sexually active with a partner who has HIV.  Talk with your health care provider about whether you are at high risk of being infected with HIV. If you choose to begin PrEP, you should first be tested for HIV. You should then be tested every 3 months for as long as you are taking PrEP.  Osteoporosis is a disease in which the bones lose minerals and strength   with aging. This can result in serious bone fractures or breaks. The risk of osteoporosis can be identified using a bone density scan. Women ages 65 years and over and women at risk for fractures or osteoporosis should discuss screening with their health care providers. Ask your health care provider whether you should take a calcium supplement or vitamin D to reduce the rate of osteoporosis.  Menopause can be associated with physical symptoms and risks. Hormone replacement therapy is available to decrease symptoms and risks. You should talk to your health care provider about whether hormone replacement therapy is right for you.  Use sunscreen. Apply sunscreen liberally and repeatedly throughout the day. You should seek shade when your shadow is shorter than you. Protect yourself by wearing long sleeves, pants, a wide-brimmed hat, and sunglasses year round, whenever you are outdoors.  Once a month, do a whole body skin exam, using a mirror to look at the skin on your back. Tell your health care provider of new moles, moles that have irregular borders, moles that are larger than a pencil eraser, or moles that have changed in shape or color.  Stay current with required vaccines (immunizations).  Influenza vaccine. All adults should be immunized every year.  Tetanus, diphtheria, and acellular pertussis (Td, Tdap) vaccine. Pregnant women should  receive 1 dose of Tdap vaccine during each pregnancy. The dose should be obtained regardless of the length of time since the last dose. Immunization is preferred during the 27th-36th week of gestation. An adult who has not previously received Tdap or who does not know her vaccine status should receive 1 dose of Tdap. This initial dose should be followed by tetanus and diphtheria toxoids (Td) booster doses every 10 years. Adults with an unknown or incomplete history of completing a 3-dose immunization series with Td-containing vaccines should begin or complete a primary immunization series including a Tdap dose. Adults should receive a Td booster every 10 years.  Varicella vaccine. An adult without evidence of immunity to varicella should receive 2 doses or a second dose if she has previously received 1 dose. Pregnant females who do not have evidence of immunity should receive the first dose after pregnancy. This first dose should be obtained before leaving the health care facility. The second dose should be obtained 4-8 weeks after the first dose.  Human papillomavirus (HPV) vaccine. Females aged 13-26 years who have not received the vaccine previously should obtain the 3-dose series. The vaccine is not recommended for use in pregnant females. However, pregnancy testing is not needed before receiving a dose. If a female is found to be pregnant after receiving a dose, no treatment is needed. In that case, the remaining doses should be delayed until after the pregnancy. Immunization is recommended for any person with an immunocompromised condition through the age of 26 years if she did not get any or all doses earlier. During the 3-dose series, the second dose should be obtained 4-8 weeks after the first dose. The third dose should be obtained 24 weeks after the first dose and 16 weeks after the second dose.  Zoster vaccine. One dose is recommended for adults aged 60 years or older unless certain conditions are  present.  Measles, mumps, and rubella (MMR) vaccine. Adults born before 1957 generally are considered immune to measles and mumps. Adults born in 1957 or later should have 1 or more doses of MMR vaccine unless there is a contraindication to the vaccine or there is laboratory evidence of immunity to   each of the three diseases. A routine second dose of MMR vaccine should be obtained at least 28 days after the first dose for students attending postsecondary schools, health care workers, or international travelers. People who received inactivated measles vaccine or an unknown type of measles vaccine during 1963-1967 should receive 2 doses of MMR vaccine. People who received inactivated mumps vaccine or an unknown type of mumps vaccine before 1979 and are at high risk for mumps infection should consider immunization with 2 doses of MMR vaccine. For females of childbearing age, rubella immunity should be determined. If there is no evidence of immunity, females who are not pregnant should be vaccinated. If there is no evidence of immunity, females who are pregnant should delay immunization until after pregnancy. Unvaccinated health care workers born before 1957 who lack laboratory evidence of measles, mumps, or rubella immunity or laboratory confirmation of disease should consider measles and mumps immunization with 2 doses of MMR vaccine or rubella immunization with 1 dose of MMR vaccine.  Pneumococcal 13-valent conjugate (PCV13) vaccine. When indicated, a person who is uncertain of her immunization history and has no record of immunization should receive the PCV13 vaccine. An adult aged 19 years or older who has certain medical conditions and has not been previously immunized should receive 1 dose of PCV13 vaccine. This PCV13 should be followed with a dose of pneumococcal polysaccharide (PPSV23) vaccine. The PPSV23 vaccine dose should be obtained at least 8 weeks after the dose of PCV13 vaccine. An adult aged 19  years or older who has certain medical conditions and previously received 1 or more doses of PPSV23 vaccine should receive 1 dose of PCV13. The PCV13 vaccine dose should be obtained 1 or more years after the last PPSV23 vaccine dose.  Pneumococcal polysaccharide (PPSV23) vaccine. When PCV13 is also indicated, PCV13 should be obtained first. All adults aged 65 years and older should be immunized. An adult younger than age 65 years who has certain medical conditions should be immunized. Any person who resides in a nursing home or long-term care facility should be immunized. An adult smoker should be immunized. People with an immunocompromised condition and certain other conditions should receive both PCV13 and PPSV23 vaccines. People with human immunodeficiency virus (HIV) infection should be immunized as soon as possible after diagnosis. Immunization during chemotherapy or radiation therapy should be avoided. Routine use of PPSV23 vaccine is not recommended for American Indians, [place] Natives, or people younger than 65 years unless there are medical conditions that require PPSV23 vaccine. When indicated, people who have unknown immunization and have no record of immunization should receive PPSV23 vaccine. One-time revaccination 5 years after the first dose of PPSV23 is recommended for people aged 19-64 years who have chronic kidney failure, nephrotic syndrome, asplenia, or immunocompromised conditions. People who received 1-2 doses of PPSV23 before age 65 years should receive another dose of PPSV23 vaccine at age 65 years or later if at least 5 years have passed since the previous dose. Doses of PPSV23 are not needed for people immunized with PPSV23 at or after age 65 years.  Meningococcal vaccine. Adults with asplenia or persistent complement component deficiencies should receive 2 doses of quadrivalent meningococcal conjugate (MenACWY-D) vaccine. The doses should be obtained at least 2 months apart.  Microbiologists working with certain meningococcal bacteria, military recruits, people at risk during an outbreak, and people who travel to or live in countries with a high rate of meningitis should be immunized. A first-year college student up through age   21 years who is living in a residence hall should receive a dose if she did not receive a dose on or after her 16th birthday. Adults who have certain high-risk conditions should receive one or more doses of vaccine.  Hepatitis A vaccine. Adults who wish to be protected from this disease, have certain high-risk conditions, work with hepatitis A-infected animals, work in hepatitis A research labs, or travel to or work in countries with a high rate of hepatitis A should be immunized. Adults who were previously unvaccinated and who anticipate close contact with an international adoptee during the first 60 days after arrival in the [place] from a country with a high rate of hepatitis A should be immunized.  Hepatitis B vaccine. Adults who wish to be protected from this disease, have certain high-risk conditions, may be exposed to blood or other infectious body fluids, are household contacts or sex partners of hepatitis B positive people, are clients or workers in certain care facilities, or travel to or work in countries with a high rate of hepatitis B should be immunized.  Haemophilus influenzae type b (Hib) vaccine. A previously unvaccinated person with asplenia or sickle cell disease or having a scheduled splenectomy should receive 1 dose of Hib vaccine. Regardless of previous immunization, a recipient of a hematopoietic stem cell transplant should receive a 3-dose series 6-12 months after her successful transplant. Hib vaccine is not recommended for adults with HIV infection. Preventive Services / Frequency Ages 64 to 68 years  Blood pressure check.** / Every 1 to 2 years.  Lipid and cholesterol check.** / Every 5 years beginning at age  22.  Clinical breast exam.** / Every 3 years for women in their 88s and 53s.  BRCA-related cancer risk assessment.** / For women who have family members with a BRCA-related cancer (breast, ovarian, tubal, or peritoneal cancers).  Pap test.** / Every 2 years from ages 90 through 51. Every 3 years starting at age 21 through age 56 or 3 with a history of 3 consecutive normal Pap tests.  HPV screening.** / Every 3 years from ages 24 through ages 1 to 46 with a history of 3 consecutive normal Pap tests.  Hepatitis C blood test.** / For any individual with known risks for hepatitis C.  Skin self-exam. / Monthly.  Influenza vaccine. / Every year.  Tetanus, diphtheria, and acellular pertussis (Tdap, Td) vaccine.** / Consult your health care provider. Pregnant women should receive 1 dose of Tdap vaccine during each pregnancy. 1 dose of Td every 10 years.  Varicella vaccine.** / Consult your health care provider. Pregnant females who do not have evidence of immunity should receive the first dose after pregnancy.  HPV vaccine. / 3 doses over 6 months, if 72 and younger. The vaccine is not recommended for use in pregnant females. However, pregnancy testing is not needed before receiving a dose.  Measles, mumps, rubella (MMR) vaccine.** / You need at least 1 dose of MMR if you were born in 1957 or later. You may also need a 2nd dose. For females of childbearing age, rubella immunity should be determined. If there is no evidence of immunity, females who are not pregnant should be vaccinated. If there is no evidence of immunity, females who are pregnant should delay immunization until after pregnancy.  Pneumococcal 13-valent conjugate (PCV13) vaccine.** / Consult your health care provider.  Pneumococcal polysaccharide (PPSV23) vaccine.** / 1 to 2 doses if you smoke cigarettes or if you have certain conditions.  Meningococcal vaccine.** /  1 dose if you are age 19 to 21 years and a first-year college  student living in a residence hall, or have one of several medical conditions, you need to get vaccinated against meningococcal disease. You may also need additional booster doses.  Hepatitis A vaccine.** / Consult your health care provider.  Hepatitis B vaccine.** / Consult your health care provider.  Haemophilus influenzae type b (Hib) vaccine.** / Consult your health care provider. Ages 40 to 64 years  Blood pressure check.** / Every 1 to 2 years.  Lipid and cholesterol check.** / Every 5 years beginning at age 20 years.  Lung cancer screening. / Every year if you are aged 55-80 years and have a 30-pack-year history of smoking and currently smoke or have quit within the past 15 years. Yearly screening is stopped once you have quit smoking for at least 15 years or develop a health problem that would prevent you from having lung cancer treatment.  Clinical breast exam.** / Every year after age 40 years.  BRCA-related cancer risk assessment.** / For women who have family members with a BRCA-related cancer (breast, ovarian, tubal, or peritoneal cancers).  Mammogram.** / Every year beginning at age 40 years and continuing for as long as you are in good health. Consult with your health care provider.  Pap test.** / Every 3 years starting at age 30 years through age 65 or 70 years with a history of 3 consecutive normal Pap tests.  HPV screening.** / Every 3 years from ages 30 years through ages 65 to 70 years with a history of 3 consecutive normal Pap tests.  Fecal occult blood test (FOBT) of stool. / Every year beginning at age 50 years and continuing until age 75 years. You may not need to do this test if you get a colonoscopy every 10 years.  Flexible sigmoidoscopy or colonoscopy.** / Every 5 years for a flexible sigmoidoscopy or every 10 years for a colonoscopy beginning at age 50 years and continuing until age 75 years.  Hepatitis C blood test.** / For all people born from 1945 through  1965 and any individual with known risks for hepatitis C.  Skin self-exam. / Monthly.  Influenza vaccine. / Every year.  Tetanus, diphtheria, and acellular pertussis (Tdap/Td) vaccine.** / Consult your health care provider. Pregnant women should receive 1 dose of Tdap vaccine during each pregnancy. 1 dose of Td every 10 years.  Varicella vaccine.** / Consult your health care provider. Pregnant females who do not have evidence of immunity should receive the first dose after pregnancy.  Zoster vaccine.** / 1 dose for adults aged 60 years or older.  Measles, mumps, rubella (MMR) vaccine.** / You need at least 1 dose of MMR if you were born in 1957 or later. You may also need a 2nd dose. For females of childbearing age, rubella immunity should be determined. If there is no evidence of immunity, females who are not pregnant should be vaccinated. If there is no evidence of immunity, females who are pregnant should delay immunization until after pregnancy.  Pneumococcal 13-valent conjugate (PCV13) vaccine.** / Consult your health care provider.  Pneumococcal polysaccharide (PPSV23) vaccine.** / 1 to 2 doses if you smoke cigarettes or if you have certain conditions.  Meningococcal vaccine.** / Consult your health care provider.  Hepatitis A vaccine.** / Consult your health care provider.  Hepatitis B vaccine.** / Consult your health care provider.  Haemophilus influenzae type b (Hib) vaccine.** / Consult your health care provider. Ages 65   years and over  Blood pressure check.** / Every 1 to 2 years.  Lipid and cholesterol check.** / Every 5 years beginning at age 22 years.  Lung cancer screening. / Every year if you are aged 73-80 years and have a 30-pack-year history of smoking and currently smoke or have quit within the past 15 years. Yearly screening is stopped once you have quit smoking for at least 15 years or develop a health problem that would prevent you from having lung cancer  treatment.  Clinical breast exam.** / Every year after age 4 years.  BRCA-related cancer risk assessment.** / For women who have family members with a BRCA-related cancer (breast, ovarian, tubal, or peritoneal cancers).  Mammogram.** / Every year beginning at age 40 years and continuing for as long as you are in good health. Consult with your health care provider.  Pap test.** / Every 3 years starting at age 9 years through age 34 or 91 years with 3 consecutive normal Pap tests. Testing can be stopped between 65 and 70 years with 3 consecutive normal Pap tests and no abnormal Pap or HPV tests in the past 10 years.  HPV screening.** / Every 3 years from ages 57 years through ages 64 or 45 years with a history of 3 consecutive normal Pap tests. Testing can be stopped between 65 and 70 years with 3 consecutive normal Pap tests and no abnormal Pap or HPV tests in the past 10 years.  Fecal occult blood test (FOBT) of stool. / Every year beginning at age 15 years and continuing until age 17 years. You may not need to do this test if you get a colonoscopy every 10 years.  Flexible sigmoidoscopy or colonoscopy.** / Every 5 years for a flexible sigmoidoscopy or every 10 years for a colonoscopy beginning at age 86 years and continuing until age 71 years.  Hepatitis C blood test.** / For all people born from [birth year] through 1965 and any individual with known risks for hepatitis C.  Osteoporosis screening.** / A one-time screening for women ages 83 years and over and women at risk for fractures or osteoporosis.  Skin self-exam. / Monthly.  Influenza vaccine. / Every year.  Tetanus, diphtheria, and acellular pertussis (Tdap/Td) vaccine.** / 1 dose of Td every 10 years.  Varicella vaccine.** / Consult your health care provider.  Zoster vaccine.** / 1 dose for adults aged 61 years or older.  Pneumococcal 13-valent conjugate (PCV13) vaccine.** / Consult your health care provider.  Pneumococcal  polysaccharide (PPSV23) vaccine.** / 1 dose for all adults aged 28 years and older.  Meningococcal vaccine.** / Consult your health care provider.  Hepatitis A vaccine.** / Consult your health care provider.  Hepatitis B vaccine.** / Consult your health care provider.  Haemophilus influenzae type b (Hib) vaccine.** / Consult your health care provider. ** Family history and personal history of risk and conditions may change your health care provider's recommendations. Document Released: 08/13/2001 Document Revised: 11/01/2013 Document Reviewed: 11/12/2010 [facility] Patient Information 2015 [place], [place]. This information is not intended to replace advice given to you by your health care provider. Make sure you discuss any questions you have with your health care provider.

## 2014-03-28 NOTE — Progress Notes (Signed)
Subjective:     Heather Pierce is a 63 y.o. female and is here for a comprehensive physical exam. The patient reports no problems.  History   Social History  . Marital Status: Single    Spouse Name: N/A    Number of Children: N/A  . Years of Education: N/A   Occupational History  . Passenger transport manager     [employer]  .     Social History Main Topics  . Smoking status: Never Smoker   . Smokeless tobacco: Never Used  . Alcohol Use: Yes     Comment: occ. maybe 2 drinks per month per pt.  . Drug Use: No  . Sexual Activity: No   Other Topics Concern  . Not on file   Social History Narrative   Exercise-- no   Health Maintenance  Topic Date Due  . Pap Smear  09/30/2013  . Influenza Vaccine  01/30/2015  . Mammogram  10/09/2015  . Tetanus/tdap  04/19/2019  . Colonoscopy  01/16/2023  . Zostavax  Completed    The following portions of the patient's history were reviewed and updated as appropriate:  She  has a past medical history of Hypertension; GERD (gastroesophageal reflux disease); Hyperlipidemia; and Allergy. She  does not have any pertinent problems on file. She  has past surgical history that includes Knee arthroscopy (11/2009). Her family history includes Alzheimer's disease in her father and mother; Arthritis in an other family member; COPD in her father; Cancer in her father; Colon cancer (age of onset: 80) in her maternal grandmother; Colon polyps in her father; Coronary artery disease in an other family member; Dementia in her father and mother; GER disease in her father; Hyperlipidemia in an other family member; Hypertension in an other family member; Jaundice in her father; Melanoma in an other family member; Osteoarthritis in her father. She  reports that she has never smoked. She has never used smokeless tobacco. She reports that she drinks alcohol. She reports that she does not use illicit drugs. She has a current medication list which includes the following  prescription(s): hydrochlorothiazide and simvastatin. Current Outpatient Prescriptions on File Prior to Visit  Medication Sig Dispense Refill  . hydrochlorothiazide (HYDRODIURIL) 25 MG tablet TAKE 1 TABLET (25 MG TOTAL) BY MOUTH DAILY.  90 tablet  1  . simvastatin (ZOCOR) 40 MG tablet 1 TAB BY MOUTH DAILY-- OFFICE VISIT WITH LABS ARE DUE NOW  30 tablet  0   No current facility-administered medications on file prior to visit.   She has No Known Allergies..  Review of Systems Review of Systems  Constitutional: Negative for activity change, appetite change and fatigue.  HENT: Negative for hearing loss, congestion, tinnitus and ear discharge.  dentist q8m Eyes: Negative for visual disturbance (see optho q1y -- vision corrected to 20/20 with glasses).  Respiratory: Negative for cough, chest tightness and shortness of breath.   Cardiovascular: Negative for chest pain, palpitations and leg swelling.  Gastrointestinal: Negative for abdominal pain, diarrhea, constipation and abdominal distention.  Genitourinary: Negative for urgency, frequency, decreased urine volume and difficulty urinating.  Musculoskeletal: Negative for back pain, arthralgias and gait problem.  Skin: Negative for color change, pallor and rash.  Neurological: Negative for dizziness, light-headedness, numbness and headaches.  Hematological: Negative for adenopathy. Does not bruise/bleed easily.  Psychiatric/Behavioral: Negative for suicidal ideas, confusion, sleep disturbance, self-injury, dysphoric mood, decreased concentration and agitation.       Objective:    BP 142/88  Pulse 83  Temp(Src) 97.5  F (36.4 C) (Oral)  Ht 5' 4.25" (1.632 m)  Wt 206 lb 9.1 oz (93.7 kg)  BMI 35.18 kg/m2  SpO2 97%  LMP 09/24/2005 General appearance: alert, cooperative, appears stated age and no distress Head: Normocephalic, without obvious abnormality, atraumatic Eyes: conjunctivae/corneas clear. PERRL, EOM's intact. Fundi  benign. Ears: normal TM's and external ear canals both ears Nose: Nares normal. Septum midline. Mucosa normal. No drainage or sinus tenderness. Throat: lips, mucosa, and tongue normal; teeth and gums normal Neck: no adenopathy, no carotid bruit, no JVD, supple, symmetrical, trachea midline and thyroid not enlarged, symmetric, no tenderness/mass/nodules Back: symmetric, no curvature. ROM normal. No CVA tenderness. Lungs: clear to auscultation bilaterally Breasts: normal appearance, no masses or tenderness Heart: S1, S2 normal--+ murmur Abdomen: soft, non-tender; bowel sounds normal; no masses,  no organomegaly Pelvic: deferred Extremities: extremities normal, atraumatic, no cyanosis or edema Pulses: 2+ and symmetric Skin: Skin color, texture, turgor normal. No rashes or lesions Lymph nodes: Cervical, supraclavicular, and axillary nodes normal. Neurologic: Alert and oriented X 3, normal strength and tone. Normal symmetric reflexes. Normal coordination and gait    Assessment:    Healthy female exam.      Plan:     ghm utd Check labs See After Visit Summary for Counseling Recommendations  1. Preventative health care ghm utd  - Basic metabolic panel - CBC with Differential - Hepatic function panel - Lipid panel - POCT urinalysis dipstick - TSH  2. Other and unspecified hyperlipidemia Check labs - Hepatic function panel - Lipid panel

## 2014-04-04 ENCOUNTER — Other Ambulatory Visit: Payer: Self-pay

## 2014-04-04 MED ORDER — SIMVASTATIN 40 MG PO TABS: ORAL_TABLET | ORAL | Status: DC

## 2014-06-28 ENCOUNTER — Other Ambulatory Visit: Payer: Self-pay

## 2014-06-28 MED ORDER — HYDROCHLOROTHIAZIDE 25 MG PO TABS: ORAL_TABLET | ORAL | Status: DC

## 2014-08-27 ENCOUNTER — Encounter: Payer: Self-pay | Admitting: Family Medicine

## 2014-09-07 ENCOUNTER — Other Ambulatory Visit: Payer: Self-pay

## 2014-09-07 MED ORDER — SIMVASTATIN 40 MG PO TABS: ORAL_TABLET | ORAL | Status: DC

## 2014-09-12 ENCOUNTER — Other Ambulatory Visit: Payer: Self-pay | Admitting: Family Medicine

## 2014-09-12 DIAGNOSIS — Z1231 Encounter for screening mammogram for malignant neoplasm of breast: Secondary | ICD-10-CM

## 2014-10-05 ENCOUNTER — Other Ambulatory Visit: Payer: Self-pay | Admitting: Family Medicine

## 2014-10-06 ENCOUNTER — Other Ambulatory Visit: Payer: Self-pay

## 2014-10-06 DIAGNOSIS — E785 Hyperlipidemia, unspecified: Secondary | ICD-10-CM

## 2014-10-11 ENCOUNTER — Ambulatory Visit (HOSPITAL_BASED_OUTPATIENT_CLINIC_OR_DEPARTMENT_OTHER): Payer: Self-pay

## 2014-10-18 ENCOUNTER — Other Ambulatory Visit (INDEPENDENT_AMBULATORY_CARE_PROVIDER_SITE_OTHER): Payer: BLUE CROSS/BLUE SHIELD

## 2014-10-18 ENCOUNTER — Ambulatory Visit (HOSPITAL_BASED_OUTPATIENT_CLINIC_OR_DEPARTMENT_OTHER)
Admission: RE | Admit: 2014-10-18 | Discharge: 2014-10-18 | Disposition: A | Discharge status: Home / Self Care | Payer: BLUE CROSS/BLUE SHIELD | Source: Ambulatory Visit | Attending: Family Medicine | Admitting: Family Medicine

## 2014-10-18 DIAGNOSIS — E785 Hyperlipidemia, unspecified: Secondary | ICD-10-CM | POA: Diagnosis not present

## 2014-10-18 DIAGNOSIS — Z1231 Encounter for screening mammogram for malignant neoplasm of breast: Secondary | ICD-10-CM | POA: Diagnosis not present

## 2014-10-18 LAB — HEPATIC FUNCTION PANEL
ALK PHOS: 78 U/L (ref 39–117)
ALT: 23 U/L (ref 0–35)
AST: 21 U/L (ref 0–37)
Albumin: 4.5 g/dL (ref 3.5–5.2)
BILIRUBIN DIRECT: 0.1 mg/dL (ref 0.0–0.3)
TOTAL PROTEIN: 7.1 g/dL (ref 6.0–8.3)
Total Bilirubin: 0.7 mg/dL (ref 0.2–1.2)

## 2014-10-18 LAB — LIPID PANEL
CHOL/HDL RATIO: 3
Cholesterol: 145 mg/dL (ref 0–200)
HDL: 57.2 mg/dL (ref >39.00)
LDL CALC: 69 mg/dL (ref 0–99)
NONHDL: 87.8
Triglycerides: 96 mg/dL (ref 0.0–149.0)
VLDL: 19.2 mg/dL (ref 0.0–40.0)

## 2015-01-01 ENCOUNTER — Other Ambulatory Visit: Payer: Self-pay | Admitting: Family Medicine

## 2015-01-07 ENCOUNTER — Other Ambulatory Visit: Payer: Self-pay | Admitting: Family Medicine

## 2015-04-03 ENCOUNTER — Encounter: Payer: Self-pay | Admitting: Family Medicine

## 2015-04-03 ENCOUNTER — Ambulatory Visit (INDEPENDENT_AMBULATORY_CARE_PROVIDER_SITE_OTHER): Payer: BLUE CROSS/BLUE SHIELD | Admitting: Family Medicine

## 2015-04-03 VITALS — BP 140/88 | HR 80 | Temp 98.5°F | Ht 64.5 in | Wt 204.6 lb

## 2015-04-03 DIAGNOSIS — E785 Hyperlipidemia, unspecified: Secondary | ICD-10-CM | POA: Diagnosis not present

## 2015-04-03 DIAGNOSIS — Z0000 Encounter for general adult medical examination without abnormal findings: Secondary | ICD-10-CM | POA: Diagnosis not present

## 2015-04-03 DIAGNOSIS — R0789 Other chest pain: Secondary | ICD-10-CM | POA: Diagnosis not present

## 2015-04-03 DIAGNOSIS — I10 Essential (primary) hypertension: Secondary | ICD-10-CM | POA: Diagnosis not present

## 2015-04-03 LAB — COMPREHENSIVE METABOLIC PANEL
ALBUMIN: 4.5 g/dL (ref 3.5–5.2)
ALT: 26 U/L (ref 0–35)
AST: 24 U/L (ref 0–37)
Alkaline Phosphatase: 76 U/L (ref 39–117)
BUN: 16 mg/dL (ref 6–23)
CO2: 31 meq/L (ref 19–32)
CREATININE: 0.98 mg/dL (ref 0.40–1.20)
Calcium: 9.8 mg/dL (ref 8.4–10.5)
Chloride: 102 mEq/L (ref 96–112)
GFR: 60.67 mL/min (ref >60.00)
GLUCOSE: 93 mg/dL (ref 70–99)
Potassium: 3.5 mEq/L (ref 3.5–5.1)
Sodium: 144 mEq/L (ref 135–145)
Total Bilirubin: 0.6 mg/dL (ref 0.2–1.2)
Total Protein: 7.2 g/dL (ref 6.0–8.3)

## 2015-04-03 LAB — TSH: TSH: 1.18 u[IU]/mL (ref 0.35–4.50)

## 2015-04-03 LAB — POCT URINALYSIS DIPSTICK
Bilirubin, UA: NEGATIVE
Blood, UA: NEGATIVE
Glucose, UA: NEGATIVE
KETONES UA: NEGATIVE
LEUKOCYTES UA: NEGATIVE
Nitrite, UA: NEGATIVE
Protein, UA: NEGATIVE
Spec Grav, UA: >=1.03
Urobilinogen, UA: 0.2
pH, UA: 6

## 2015-04-03 LAB — CBC WITH DIFFERENTIAL/PLATELET
BASOS ABS: 0 10*3/uL (ref 0.0–0.1)
Basophils Relative: 0.4 % (ref 0.0–3.0)
Eosinophils Absolute: 0.1 10*3/uL (ref 0.0–0.7)
Eosinophils Relative: 1.8 % (ref 0.0–5.0)
HEMATOCRIT: 41.4 % (ref 36.0–46.0)
Hemoglobin: 13.9 g/dL (ref 12.0–15.0)
LYMPHS PCT: 27.8 % (ref 12.0–46.0)
Lymphs Abs: 1.7 10*3/uL (ref 0.7–4.0)
MCHC: 33.5 g/dL (ref 30.0–36.0)
MCV: 86.1 fl (ref 78.0–100.0)
MONOS PCT: 6.4 % (ref 3.0–12.0)
Monocytes Absolute: 0.4 10*3/uL (ref 0.1–1.0)
NEUTROS PCT: 63.6 % (ref 43.0–77.0)
Neutro Abs: 3.9 10*3/uL (ref 1.4–7.7)
Platelets: 185 10*3/uL (ref 150.0–400.0)
RBC: 4.81 Mil/uL (ref 3.87–5.11)
RDW: 13.3 % (ref 11.5–15.5)
WBC: 6.1 10*3/uL (ref 4.0–10.5)

## 2015-04-03 LAB — HEPATITIS C ANTIBODY: HCV Ab: NEGATIVE

## 2015-04-03 LAB — LIPID PANEL
CHOL/HDL RATIO: 3
Cholesterol: 163 mg/dL (ref 0–200)
HDL: 58.9 mg/dL (ref >39.00)
LDL Cholesterol: 75 mg/dL (ref 0–99)
NONHDL: 103.68
Triglycerides: 143 mg/dL (ref 0.0–149.0)
VLDL: 28.6 mg/dL (ref 0.0–40.0)

## 2015-04-03 LAB — MICROALBUMIN / CREATININE URINE RATIO
Creatinine,U: 205.8 mg/dL
Microalb Creat Ratio: 0.6 mg/g (ref 0.0–30.0)
Microalb, Ur: 1.2 mg/dL (ref 0.0–1.9)

## 2015-04-03 LAB — HIV ANTIBODY (ROUTINE TESTING W REFLEX): HIV 1&2 Ab, 4th Generation: NONREACTIVE

## 2015-04-03 MED ORDER — SIMVASTATIN 40 MG PO TABS: ORAL_TABLET | ORAL | Status: DC

## 2015-04-03 MED ORDER — HYDROCHLOROTHIAZIDE 25 MG PO TABS: ORAL_TABLET | ORAL | Status: DC

## 2015-04-03 NOTE — Progress Notes (Signed)
Subjective:     Heather Pierce is a 64 y.o. female and is here for a comprehensive physical exam. The patient reports problems with increased gas and dyspepsia espeicially with certain foods.  Pain radiates between shoulder blades and is relieved with antacid.  She c/o increase gas as well and has not used otc meds for this.    Social History   Social History  . Marital Status: Single    Spouse Name: N/A  . Number of Children: N/A  . Years of Education: N/A   Occupational History  . 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     Social History Main Topics  . Smoking status: Never Smoker   . Smokeless tobacco: Never Used  . Alcohol Use: Yes     Comment: occ. maybe 2 drinks per month per pt.  . Drug Use: No  . Sexual Activity: No   Other Topics Concern  . Not on file   Social History Narrative   Exercise-- walking dog   Health Maintenance  Topic Date Due  . Hepatitis C Screening  06/14/1951  . HIV Screening  12/13/1965  . PAP SMEAR  04/02/2016 (Originally 09/24/2013)  . INFLUENZA VACCINE  01/30/2016  . MAMMOGRAM  10/17/2016  . TETANUS/TDAP  04/19/2019  . COLONOSCOPY  01/16/2023  . ZOSTAVAX  Completed    The following portions of the patient's history were reviewed and updated as appropriate:  She  has a past medical history of Hypertension; GERD (gastroesophageal reflux disease); Hyperlipidemia; and Allergy. She  does not have any pertinent problems on file. She  has past surgical history that includes Knee arthroscopy (11/2009). Her family history includes Alzheimer's disease in her father and mother; Arthritis in an other family member; COPD in her father; Cancer in her father; Colon cancer (age of onset: 60) in her maternal grandmother; Colon polyps in her father; Coronary artery disease in an other family member; Dementia in her father and mother; GER disease in her father; Hyperlipidemia in an other family member; Hypertension in an other family member; Jaundice in her  father; Melanoma in an other family member; Osteoarthritis in her father. She  reports that she has never smoked. She has never used smokeless tobacco. She reports that she drinks alcohol. She reports that she does not use illicit drugs. She has a current medication list which includes the following prescription(s): hydrochlorothiazide and simvastatin. No current outpatient prescriptions on file prior to visit.   No current facility-administered medications on file prior to visit.   She has No Known Allergies..  Review of Systems Review of Systems  Constitutional: Negative for activity change, appetite change and fatigue.  HENT: Negative for hearing loss, congestion, tinnitus and ear discharge.  dentist q6m Eyes: Negative for visual disturbance (see optho q1y -- vision corrected to 20/20 with glasses).  Respiratory: Negative for cough, chest tightness and shortness of breath.   Cardiovascular: Negative for chest pain, palpitations and leg swelling.  Gastrointestinal: Negative for abdominal pain, diarrhea, constipation and abdominal distention.  Genitourinary: Negative for urgency, frequency, decreased urine volume and difficulty urinating.  Musculoskeletal: Negative for back pain, arthralgias and gait problem.  Skin: Negative for color change, pallor and rash.  Neurological: Negative for dizziness, light-headedness, numbness and headaches.  Hematological: Negative for adenopathy. Does not bruise/bleed easily.  Psychiatric/Behavioral: Negative for suicidal ideas, confusion, sleep disturbance, self-injury, dysphoric mood, decreased concentration and agitation.      Objective:    BP 140/88 mmHg  Pulse 80    Temp(Src) 98.5 F (36.9 C) (Oral)  Ht 5' 4.5" (1.638 m)  Wt 204 lb 9.6 oz (92.806 kg)  BMI 34.59 kg/m2  SpO2 98%  LMP 09/24/2005 General appearance: alert, cooperative, appears stated age and no distress Head: Normocephalic, without obvious abnormality, atraumatic Eyes: negative  findings: lids and lashes normal and pupils equal, round, reactive to light and accomodation Ears: normal TM's and external ear canals both ears Nose: Nares normal. Septum midline. Mucosa normal. No drainage or sinus tenderness. Throat: lips, mucosa, and tongue normal; teeth and gums normal Neck: no adenopathy, no carotid bruit, no JVD, supple, symmetrical, trachea midline and thyroid not enlarged, symmetric, no tenderness/mass/nodules Back: symmetric, no curvature. ROM normal. No CVA tenderness. Lungs: clear to auscultation bilaterally Breasts: normal appearance, no masses or tenderness Heart: regular rate and rhythm, S1, S2 normal, no murmur, click, rub or gallop Abdomen: soft, non-tender; bowel sounds normal; no masses,  no organomegaly Pelvic: deferred Extremities: extremities normal, atraumatic, no cyanosis or edema Pulses: 2+ and symmetric Skin: Skin color, texture, turgor normal. No rashes or lesions Lymph nodes: Cervical, supraclavicular, and axillary nodes normal. Neurologic: Alert and oriented X 3, normal strength and tone. Normal symmetric reflexes. Normal coordination and gait Psych- no depression, no anxiety      Assessment:    Healthy female exam.       Plan:    ghm utd Check labs See After Visit Summary for Counseling Recommendations   1. Essential hypertension Slightly high today - hydrochlorothiazide (HYDRODIURIL) 25 MG tablet; TAKE 1 TABLET (25 MG TOTAL) BY MOUTH DAILY.  Dispense: 90 tablet; Refill: 1 - CBC with Differential/Platelet - Lipid panel - Microalbumin / creatinine urine ratio - POCT urinalysis dipstick - TSH - Hepatitis C antibody - Comp Met (CMET)  2. Hyperlipidemia con't zocor Check labs - simvastatin (ZOCOR) 40 MG tablet; TAKE 1 TABLET BY MOUTH EVERY DAY  Dispense: 90 tablet; Refill: 1 - CBC with Differential/Platelet - Lipid panel - Microalbumin / creatinine urine ratio - POCT urinalysis dipstick - TSH - Hepatitis C antibody - Comp Met  (CMET)  3. Preventative health care See above - CBC with Differential/Platelet - Lipid panel - Microalbumin / creatinine urine ratio - POCT urinalysis dipstick - TSH - Hepatitis C antibody - Comp Met (CMET)

## 2015-04-03 NOTE — Assessment & Plan Note (Signed)
ekg-- nsr ,  No changes Pain c/w gerd--- associated with certain foods Use otc PPI or acid reducer Can also use gas Ex etc  rto if symptoms worsen or do not improve

## 2015-04-03 NOTE — Progress Notes (Signed)
Pre visit review using our clinic review tool, if applicable. No additional management support is needed unless otherwise documented below in the visit note. 

## 2015-04-03 NOTE — Patient Instructions (Signed)
Preventive Care for Adults A healthy lifestyle and preventive care can promote health and wellness. Preventive health guidelines for women include the following key practices.  A routine yearly physical is a good way to check with your health care provider about your health and preventive screening. It is a chance to share any concerns and updates on your health and to receive a thorough exam.  Visit your dentist for a routine exam and preventive care every 6 months. Brush your teeth twice a day and floss once a day. Good oral hygiene prevents tooth decay and gum disease.  The frequency of eye exams is based on your age, health, family medical history, use of contact lenses, and other factors. Follow your health care provider's recommendations for frequency of eye exams.  Eat a healthy diet. Foods like vegetables, fruits, whole grains, low-fat dairy products, and lean protein foods contain the nutrients you need without too many calories. Decrease your intake of foods high in solid fats, added sugars, and salt. Eat the right amount of calories for you.Get information about a proper diet from your health care provider, if necessary.  Regular physical exercise is one of the most important things you can do for your health. Most adults should get at least 150 minutes of moderate-intensity exercise (any activity that increases your heart rate and causes you to sweat) each week. In addition, most adults need muscle-strengthening exercises on 2 or more days a week.  Maintain a healthy weight. The body mass index (BMI) is a screening tool to identify possible weight problems. It provides an estimate of body fat based on height and weight. Your health care provider can find your BMI and can help you achieve or maintain a healthy weight.For adults 20 years and older:  A BMI below 18.5 is considered underweight.  A BMI of 18.5 to 24.9 is normal.  A BMI of 25 to 29.9 is considered overweight.  A BMI of  30 and above is considered obese.  Maintain normal blood lipids and cholesterol levels by exercising and minimizing your intake of saturated fat. Eat a balanced diet with plenty of fruit and vegetables. Blood tests for lipids and cholesterol should begin at age 76 and be repeated every 5 years. If your lipid or cholesterol levels are high, you are over 50, or you are at high risk for heart disease, you may need your cholesterol levels checked more frequently.Ongoing high lipid and cholesterol levels should be treated with medicines if diet and exercise are not working.  If you smoke, find out from your health care provider how to quit. If you do not use tobacco, do not start.  Lung cancer screening is recommended for adults aged 22-80 years who are at high risk for developing lung cancer because of a history of smoking. A yearly low-dose CT scan of the lungs is recommended for people who have at least a 30-pack-year history of smoking and are a current smoker or have quit within the past 15 years. A pack year of smoking is smoking an average of 1 pack of cigarettes a day for 1 year (for example: 1 pack a day for 30 years or 2 packs a day for 15 years). Yearly screening should continue until the smoker has stopped smoking for at least 15 years. Yearly screening should be stopped for people who develop a health problem that would prevent them from having lung cancer treatment.  If you are pregnant, do not drink alcohol. If you are breastfeeding,  be very cautious about drinking alcohol. If you are not pregnant and choose to drink alcohol, do not have more than 1 drink per day. One drink is considered to be 12 ounces (355 mL) of beer, 5 ounces (148 mL) of wine, or 1.5 ounces (44 mL) of liquor.  Avoid use of street drugs. Do not share needles with anyone. Ask for help if you need support or instructions about stopping the use of drugs.  High blood pressure causes heart disease and increases the risk of  stroke. Your blood pressure should be checked at least every 1 to 2 years. Ongoing high blood pressure should be treated with medicines if weight loss and exercise do not work.  If you are 75-52 years old, ask your health care provider if you should take aspirin to prevent strokes.  Diabetes screening involves taking a blood sample to check your fasting blood sugar level. This should be done once every 3 years, after age 15, if you are within normal weight and without risk factors for diabetes. Testing should be considered at a younger age or be carried out more frequently if you are overweight and have at least 1 risk factor for diabetes.  Breast cancer screening is essential preventive care for women. You should practice "breast self-awareness." This means understanding the normal appearance and feel of your breasts and may include breast self-examination. Any changes detected, no matter how small, should be reported to a health care provider. Women in their 58s and 30s should have a clinical breast exam (CBE) by a health care provider as part of a regular health exam every 1 to 3 years. After age 16, women should have a CBE every year. Starting at age 53, women should consider having a mammogram (breast X-ray test) every year. Women who have a family history of breast cancer should talk to their health care provider about genetic screening. Women at a high risk of breast cancer should talk to their health care providers about having an MRI and a mammogram every year.  Breast cancer gene (BRCA)-related cancer risk assessment is recommended for women who have family members with BRCA-related cancers. BRCA-related cancers include breast, ovarian, tubal, and peritoneal cancers. Having family members with these cancers may be associated with an increased risk for harmful changes (mutations) in the breast cancer genes BRCA1 and BRCA2. Results of the assessment will determine the need for genetic counseling and  BRCA1 and BRCA2 testing.  Routine pelvic exams to screen for cancer are no longer recommended for nonpregnant women who are considered low risk for cancer of the pelvic organs (ovaries, uterus, and vagina) and who do not have symptoms. Ask your health care provider if a screening pelvic exam is right for you.  If you have had past treatment for cervical cancer or a condition that could lead to cancer, you need Pap tests and screening for cancer for at least 20 years after your treatment. If Pap tests have been discontinued, your risk factors (such as having a new sexual partner) need to be reassessed to determine if screening should be resumed. Some women have medical problems that increase the chance of getting cervical cancer. In these cases, your health care provider may recommend more frequent screening and Pap tests.  The HPV test is an additional test that may be used for cervical cancer screening. The HPV test looks for the virus that can cause the cell changes on the cervix. The cells collected during the Pap test can be  tested for HPV. The HPV test could be used to screen women aged 30 years and older, and should be used in women of any age who have unclear Pap test results. After the age of 30, women should have HPV testing at the same frequency as a Pap test.  Colorectal cancer can be detected and often prevented. Most routine colorectal cancer screening begins at the age of 50 years and continues through age 75 years. However, your health care provider may recommend screening at an earlier age if you have risk factors for colon cancer. On a yearly basis, your health care provider may provide home test kits to check for hidden blood in the stool. Use of a small camera at the end of a tube, to directly examine the colon (sigmoidoscopy or colonoscopy), can detect the earliest forms of colorectal cancer. Talk to your health care provider about this at age 50, when routine screening begins. Direct  exam of the colon should be repeated every 5-10 years through age 75 years, unless early forms of pre-cancerous polyps or small growths are found.  People who are at an increased risk for hepatitis B should be screened for this virus. You are considered at high risk for hepatitis B if:  You were born in a country where hepatitis B occurs often. Talk with your health care provider about which countries are considered high risk.  Your parents were born in a high-risk country and you have not received a shot to protect against hepatitis B (hepatitis B vaccine).  You have HIV or AIDS.  You use needles to inject street drugs.  You live with, or have sex with, someone who has hepatitis B.  You get hemodialysis treatment.  You take certain medicines for conditions like cancer, organ transplantation, and autoimmune conditions.  Hepatitis C blood testing is recommended for all people born from 1945 through 1965 and any individual with known risks for hepatitis C.  Practice safe sex. Use condoms and avoid high-risk sexual practices to reduce the spread of sexually transmitted infections (STIs). STIs include gonorrhea, chlamydia, syphilis, trichomonas, herpes, HPV, and human immunodeficiency virus (HIV). Herpes, HIV, and HPV are viral illnesses that have no cure. They can result in disability, cancer, and death.  You should be screened for sexually transmitted illnesses (STIs) including gonorrhea and chlamydia if:  You are sexually active and are younger than 24 years.  You are older than 24 years and your health care provider tells you that you are at risk for this type of infection.  Your sexual activity has changed since you were last screened and you are at an increased risk for chlamydia or gonorrhea. Ask your health care provider if you are at risk.  If you are at risk of being infected with HIV, it is recommended that you take a prescription medicine daily to prevent HIV infection. This is  called preexposure prophylaxis (PrEP). You are considered at risk if:  You are a heterosexual woman, are sexually active, and are at increased risk for HIV infection.  You take drugs by injection.  You are sexually active with a partner who has HIV.  Talk with your health care provider about whether you are at high risk of being infected with HIV. If you choose to begin PrEP, you should first be tested for HIV. You should then be tested every 3 months for as long as you are taking PrEP.  Osteoporosis is a disease in which the bones lose minerals and strength   with aging. This can result in serious bone fractures or breaks. The risk of osteoporosis can be identified using a bone density scan. Women ages 65 years and over and women at risk for fractures or osteoporosis should discuss screening with their health care providers. Ask your health care provider whether you should take a calcium supplement or vitamin D to reduce the rate of osteoporosis.  Menopause can be associated with physical symptoms and risks. Hormone replacement therapy is available to decrease symptoms and risks. You should talk to your health care provider about whether hormone replacement therapy is right for you.  Use sunscreen. Apply sunscreen liberally and repeatedly throughout the day. You should seek shade when your shadow is shorter than you. Protect yourself by wearing long sleeves, pants, a wide-brimmed hat, and sunglasses year round, whenever you are outdoors.  Once a month, do a whole body skin exam, using a mirror to look at the skin on your back. Tell your health care provider of new moles, moles that have irregular borders, moles that are larger than a pencil eraser, or moles that have changed in shape or color.  Stay current with required vaccines (immunizations).  Influenza vaccine. All adults should be immunized every year.  Tetanus, diphtheria, and acellular pertussis (Td, Tdap) vaccine. Pregnant women should  receive 1 dose of Tdap vaccine during each pregnancy. The dose should be obtained regardless of the length of time since the last dose. Immunization is preferred during the 27th-36th week of gestation. An adult who has not previously received Tdap or who does not know her vaccine status should receive 1 dose of Tdap. This initial dose should be followed by tetanus and diphtheria toxoids (Td) booster doses every 10 years. Adults with an unknown or incomplete history of completing a 3-dose immunization series with Td-containing vaccines should begin or complete a primary immunization series including a Tdap dose. Adults should receive a Td booster every 10 years.  Varicella vaccine. An adult without evidence of immunity to varicella should receive 2 doses or a second dose if she has previously received 1 dose. Pregnant females who do not have evidence of immunity should receive the first dose after pregnancy. This first dose should be obtained before leaving the health care facility. The second dose should be obtained 4-8 weeks after the first dose.  Human papillomavirus (HPV) vaccine. Females aged 13-26 years who have not received the vaccine previously should obtain the 3-dose series. The vaccine is not recommended for use in pregnant females. However, pregnancy testing is not needed before receiving a dose. If a female is found to be pregnant after receiving a dose, no treatment is needed. In that case, the remaining doses should be delayed until after the pregnancy. Immunization is recommended for any person with an immunocompromised condition through the age of 26 years if she did not get any or all doses earlier. During the 3-dose series, the second dose should be obtained 4-8 weeks after the first dose. The third dose should be obtained 24 weeks after the first dose and 16 weeks after the second dose.  Zoster vaccine. One dose is recommended for adults aged 60 years or older unless certain conditions are  present.  Measles, mumps, and rubella (MMR) vaccine. Adults born before 1957 generally are considered immune to measles and mumps. Adults born in 1957 or later should have 1 or more doses of MMR vaccine unless there is a contraindication to the vaccine or there is laboratory evidence of immunity to   each of the three diseases. A routine second dose of MMR vaccine should be obtained at least 28 days after the first dose for students attending postsecondary schools, health care workers, or international travelers. People who received inactivated measles vaccine or an unknown type of measles vaccine during 1963-1967 should receive 2 doses of MMR vaccine. People who received inactivated mumps vaccine or an unknown type of mumps vaccine before 1979 and are at high risk for mumps infection should consider immunization with 2 doses of MMR vaccine. For females of childbearing age, rubella immunity should be determined. If there is no evidence of immunity, females who are not pregnant should be vaccinated. If there is no evidence of immunity, females who are pregnant should delay immunization until after pregnancy. Unvaccinated health care workers born before 1957 who lack laboratory evidence of measles, mumps, or rubella immunity or laboratory confirmation of disease should consider measles and mumps immunization with 2 doses of MMR vaccine or rubella immunization with 1 dose of MMR vaccine.  Pneumococcal 13-valent conjugate (PCV13) vaccine. When indicated, a person who is uncertain of her immunization history and has no record of immunization should receive the PCV13 vaccine. An adult aged 19 years or older who has certain medical conditions and has not been previously immunized should receive 1 dose of PCV13 vaccine. This PCV13 should be followed with a dose of pneumococcal polysaccharide (PPSV23) vaccine. The PPSV23 vaccine dose should be obtained at least 8 weeks after the dose of PCV13 vaccine. An adult aged 19  years or older who has certain medical conditions and previously received 1 or more doses of PPSV23 vaccine should receive 1 dose of PCV13. The PCV13 vaccine dose should be obtained 1 or more years after the last PPSV23 vaccine dose.  Pneumococcal polysaccharide (PPSV23) vaccine. When PCV13 is also indicated, PCV13 should be obtained first. All adults aged 65 years and older should be immunized. An adult younger than age 65 years who has certain medical conditions should be immunized. Any person who resides in a nursing home or long-term care facility should be immunized. An adult smoker should be immunized. People with an immunocompromised condition and certain other conditions should receive both PCV13 and PPSV23 vaccines. People with human immunodeficiency virus (HIV) infection should be immunized as soon as possible after diagnosis. Immunization during chemotherapy or radiation therapy should be avoided. Routine use of PPSV23 vaccine is not recommended for American Indians, [place] Natives, or people younger than 65 years unless there are medical conditions that require PPSV23 vaccine. When indicated, people who have unknown immunization and have no record of immunization should receive PPSV23 vaccine. One-time revaccination 5 years after the first dose of PPSV23 is recommended for people aged 19-64 years who have chronic kidney failure, nephrotic syndrome, asplenia, or immunocompromised conditions. People who received 1-2 doses of PPSV23 before age 65 years should receive another dose of PPSV23 vaccine at age 65 years or later if at least 5 years have passed since the previous dose. Doses of PPSV23 are not needed for people immunized with PPSV23 at or after age 65 years.  Meningococcal vaccine. Adults with asplenia or persistent complement component deficiencies should receive 2 doses of quadrivalent meningococcal conjugate (MenACWY-D) vaccine. The doses should be obtained at least 2 months apart.  Microbiologists working with certain meningococcal bacteria, military recruits, people at risk during an outbreak, and people who travel to or live in countries with a high rate of meningitis should be immunized. A first-year college student up through age   21 years who is living in a residence hall should receive a dose if she did not receive a dose on or after her 16th birthday. Adults who have certain high-risk conditions should receive one or more doses of vaccine.  Hepatitis A vaccine. Adults who wish to be protected from this disease, have certain high-risk conditions, work with hepatitis A-infected animals, work in hepatitis A research labs, or travel to or work in countries with a high rate of hepatitis A should be immunized. Adults who were previously unvaccinated and who anticipate close contact with an international adoptee during the first 60 days after arrival in the [place] from a country with a high rate of hepatitis A should be immunized.  Hepatitis B vaccine. Adults who wish to be protected from this disease, have certain high-risk conditions, may be exposed to blood or other infectious body fluids, are household contacts or sex partners of hepatitis B positive people, are clients or workers in certain care facilities, or travel to or work in countries with a high rate of hepatitis B should be immunized.  Haemophilus influenzae type b (Hib) vaccine. A previously unvaccinated person with asplenia or sickle cell disease or having a scheduled splenectomy should receive 1 dose of Hib vaccine. Regardless of previous immunization, a recipient of a hematopoietic stem cell transplant should receive a 3-dose series 6-12 months after her successful transplant. Hib vaccine is not recommended for adults with HIV infection. Preventive Services / Frequency Ages 64 to 68 years  Blood pressure check.** / Every 1 to 2 years.  Lipid and cholesterol check.** / Every 5 years beginning at age  22.  Clinical breast exam.** / Every 3 years for women in their 88s and 53s.  BRCA-related cancer risk assessment.** / For women who have family members with a BRCA-related cancer (breast, ovarian, tubal, or peritoneal cancers).  Pap test.** / Every 2 years from ages 90 through 51. Every 3 years starting at age 21 through age 56 or 3 with a history of 3 consecutive normal Pap tests.  HPV screening.** / Every 3 years from ages 24 through ages 1 to 46 with a history of 3 consecutive normal Pap tests.  Hepatitis C blood test.** / For any individual with known risks for hepatitis C.  Skin self-exam. / Monthly.  Influenza vaccine. / Every year.  Tetanus, diphtheria, and acellular pertussis (Tdap, Td) vaccine.** / Consult your health care provider. Pregnant women should receive 1 dose of Tdap vaccine during each pregnancy. 1 dose of Td every 10 years.  Varicella vaccine.** / Consult your health care provider. Pregnant females who do not have evidence of immunity should receive the first dose after pregnancy.  HPV vaccine. / 3 doses over 6 months, if 72 and younger. The vaccine is not recommended for use in pregnant females. However, pregnancy testing is not needed before receiving a dose.  Measles, mumps, rubella (MMR) vaccine.** / You need at least 1 dose of MMR if you were born in 1957 or later. You may also need a 2nd dose. For females of childbearing age, rubella immunity should be determined. If there is no evidence of immunity, females who are not pregnant should be vaccinated. If there is no evidence of immunity, females who are pregnant should delay immunization until after pregnancy.  Pneumococcal 13-valent conjugate (PCV13) vaccine.** / Consult your health care provider.  Pneumococcal polysaccharide (PPSV23) vaccine.** / 1 to 2 doses if you smoke cigarettes or if you have certain conditions.  Meningococcal vaccine.** /  1 dose if you are age 19 to 21 years and a first-year college  student living in a residence hall, or have one of several medical conditions, you need to get vaccinated against meningococcal disease. You may also need additional booster doses.  Hepatitis A vaccine.** / Consult your health care provider.  Hepatitis B vaccine.** / Consult your health care provider.  Haemophilus influenzae type b (Hib) vaccine.** / Consult your health care provider. Ages 40 to 64 years  Blood pressure check.** / Every 1 to 2 years.  Lipid and cholesterol check.** / Every 5 years beginning at age 20 years.  Lung cancer screening. / Every year if you are aged 55-80 years and have a 30-pack-year history of smoking and currently smoke or have quit within the past 15 years. Yearly screening is stopped once you have quit smoking for at least 15 years or develop a health problem that would prevent you from having lung cancer treatment.  Clinical breast exam.** / Every year after age 40 years.  BRCA-related cancer risk assessment.** / For women who have family members with a BRCA-related cancer (breast, ovarian, tubal, or peritoneal cancers).  Mammogram.** / Every year beginning at age 40 years and continuing for as long as you are in good health. Consult with your health care provider.  Pap test.** / Every 3 years starting at age 30 years through age 65 or 70 years with a history of 3 consecutive normal Pap tests.  HPV screening.** / Every 3 years from ages 30 years through ages 65 to 70 years with a history of 3 consecutive normal Pap tests.  Fecal occult blood test (FOBT) of stool. / Every year beginning at age 50 years and continuing until age 75 years. You may not need to do this test if you get a colonoscopy every 10 years.  Flexible sigmoidoscopy or colonoscopy.** / Every 5 years for a flexible sigmoidoscopy or every 10 years for a colonoscopy beginning at age 50 years and continuing until age 75 years.  Hepatitis C blood test.** / For all people born from 1945 through  1965 and any individual with known risks for hepatitis C.  Skin self-exam. / Monthly.  Influenza vaccine. / Every year.  Tetanus, diphtheria, and acellular pertussis (Tdap/Td) vaccine.** / Consult your health care provider. Pregnant women should receive 1 dose of Tdap vaccine during each pregnancy. 1 dose of Td every 10 years.  Varicella vaccine.** / Consult your health care provider. Pregnant females who do not have evidence of immunity should receive the first dose after pregnancy.  Zoster vaccine.** / 1 dose for adults aged 60 years or older.  Measles, mumps, rubella (MMR) vaccine.** / You need at least 1 dose of MMR if you were born in 1957 or later. You may also need a 2nd dose. For females of childbearing age, rubella immunity should be determined. If there is no evidence of immunity, females who are not pregnant should be vaccinated. If there is no evidence of immunity, females who are pregnant should delay immunization until after pregnancy.  Pneumococcal 13-valent conjugate (PCV13) vaccine.** / Consult your health care provider.  Pneumococcal polysaccharide (PPSV23) vaccine.** / 1 to 2 doses if you smoke cigarettes or if you have certain conditions.  Meningococcal vaccine.** / Consult your health care provider.  Hepatitis A vaccine.** / Consult your health care provider.  Hepatitis B vaccine.** / Consult your health care provider.  Haemophilus influenzae type b (Hib) vaccine.** / Consult your health care provider. Ages 65   years and over  Blood pressure check.** / Every 1 to 2 years.  Lipid and cholesterol check.** / Every 5 years beginning at age 22 years.  Lung cancer screening. / Every year if you are aged 73-80 years and have a 30-pack-year history of smoking and currently smoke or have quit within the past 15 years. Yearly screening is stopped once you have quit smoking for at least 15 years or develop a health problem that would prevent you from having lung cancer  treatment.  Clinical breast exam.** / Every year after age 4 years.  BRCA-related cancer risk assessment.** / For women who have family members with a BRCA-related cancer (breast, ovarian, tubal, or peritoneal cancers).  Mammogram.** / Every year beginning at age 40 years and continuing for as long as you are in good health. Consult with your health care provider.  Pap test.** / Every 3 years starting at age 9 years through age 34 or 91 years with 3 consecutive normal Pap tests. Testing can be stopped between 65 and 70 years with 3 consecutive normal Pap tests and no abnormal Pap or HPV tests in the past 10 years.  HPV screening.** / Every 3 years from ages 57 years through ages 64 or 45 years with a history of 3 consecutive normal Pap tests. Testing can be stopped between 65 and 70 years with 3 consecutive normal Pap tests and no abnormal Pap or HPV tests in the past 10 years.  Fecal occult blood test (FOBT) of stool. / Every year beginning at age 15 years and continuing until age 17 years. You may not need to do this test if you get a colonoscopy every 10 years.  Flexible sigmoidoscopy or colonoscopy.** / Every 5 years for a flexible sigmoidoscopy or every 10 years for a colonoscopy beginning at age 86 years and continuing until age 71 years.  Hepatitis C blood test.** / For all people born from [birth year] through 1965 and any individual with known risks for hepatitis C.  Osteoporosis screening.** / A one-time screening for women ages 83 years and over and women at risk for fractures or osteoporosis.  Skin self-exam. / Monthly.  Influenza vaccine. / Every year.  Tetanus, diphtheria, and acellular pertussis (Tdap/Td) vaccine.** / 1 dose of Td every 10 years.  Varicella vaccine.** / Consult your health care provider.  Zoster vaccine.** / 1 dose for adults aged 61 years or older.  Pneumococcal 13-valent conjugate (PCV13) vaccine.** / Consult your health care provider.  Pneumococcal  polysaccharide (PPSV23) vaccine.** / 1 dose for all adults aged 28 years and older.  Meningococcal vaccine.** / Consult your health care provider.  Hepatitis A vaccine.** / Consult your health care provider.  Hepatitis B vaccine.** / Consult your health care provider.  Haemophilus influenzae type b (Hib) vaccine.** / Consult your health care provider. ** Family history and personal history of risk and conditions may change your health care provider's recommendations. Document Released: 08/13/2001 Document Revised: 11/01/2013 Document Reviewed: 11/12/2010 [facility] Patient Information 2015 [place], [place]. This information is not intended to replace advice given to you by your health care provider. Make sure you discuss any questions you have with your health care provider.

## 2015-09-12 ENCOUNTER — Other Ambulatory Visit: Payer: Self-pay | Admitting: Family Medicine

## 2015-12-10 ENCOUNTER — Other Ambulatory Visit: Payer: Self-pay | Admitting: Family Medicine

## 2015-12-29 ENCOUNTER — Other Ambulatory Visit: Payer: Self-pay | Admitting: Family Medicine

## 2015-12-29 DIAGNOSIS — Z1231 Encounter for screening mammogram for malignant neoplasm of breast: Secondary | ICD-10-CM

## 2016-01-03 ENCOUNTER — Telehealth: Payer: Self-pay | Admitting: Family Medicine

## 2016-01-03 NOTE — Telephone Encounter (Signed)
Pt dropped off documents (Information Request from [facility] / [facility]) indicating they are requesting pt's information. Pt signed a release form to give information to them. (document scanned on pt chart -ROI) with today's date 01-03-16 and also sent to medical records.

## 2016-01-03 NOTE — Telephone Encounter (Signed)
The forms dropped off by patient are for the last 5 years of medical records.  This request has been forwarded to [place] to scan/email to medical records. JG//CMA

## 2016-02-27 ENCOUNTER — Telehealth: Payer: Self-pay | Admitting: Family Medicine

## 2016-02-27 NOTE — Telephone Encounter (Signed)
Sent message to patient via My Chart to call the office and reschedule appointment with Dr. Carollee Herter that was scheduled for 10/10 @ 9:30 AM.

## 2016-03-12 ENCOUNTER — Other Ambulatory Visit: Payer: Self-pay | Admitting: Family Medicine

## 2016-03-25 ENCOUNTER — Ambulatory Visit (INDEPENDENT_AMBULATORY_CARE_PROVIDER_SITE_OTHER): Payer: Medicare Other | Admitting: Behavioral Health

## 2016-03-25 DIAGNOSIS — Z23 Encounter for immunization: Secondary | ICD-10-CM

## 2016-03-25 NOTE — Progress Notes (Addendum)
Pre visit review using our clinic review tool, if applicable. No additional management support is needed unless otherwise documented below in the visit note.  Patient in clinic today for Influenza vaccination. IM given in Left Deltoid. Patient tolerated injection well.

## 2016-04-02 ENCOUNTER — Encounter: Payer: Self-pay | Admitting: Family Medicine

## 2016-04-02 ENCOUNTER — Ambulatory Visit (HOSPITAL_BASED_OUTPATIENT_CLINIC_OR_DEPARTMENT_OTHER): Payer: Medicare Other

## 2016-04-04 ENCOUNTER — Ambulatory Visit (HOSPITAL_BASED_OUTPATIENT_CLINIC_OR_DEPARTMENT_OTHER): Payer: Medicare Other

## 2016-04-09 ENCOUNTER — Encounter: Payer: Self-pay | Admitting: Family Medicine

## 2016-04-11 ENCOUNTER — Ambulatory Visit (HOSPITAL_BASED_OUTPATIENT_CLINIC_OR_DEPARTMENT_OTHER)
Admission: RE | Admit: 2016-04-11 | Discharge: 2016-04-11 | Disposition: A | Discharge status: Home / Self Care | Payer: Medicare Other | Source: Ambulatory Visit | Attending: Family Medicine | Admitting: Family Medicine

## 2016-04-11 DIAGNOSIS — Z1231 Encounter for screening mammogram for malignant neoplasm of breast: Secondary | ICD-10-CM | POA: Insufficient documentation

## 2016-04-11 DIAGNOSIS — R928 Other abnormal and inconclusive findings on diagnostic imaging of breast: Secondary | ICD-10-CM | POA: Diagnosis not present

## 2016-04-16 ENCOUNTER — Other Ambulatory Visit: Payer: Self-pay | Admitting: Family Medicine

## 2016-04-16 DIAGNOSIS — R928 Other abnormal and inconclusive findings on diagnostic imaging of breast: Secondary | ICD-10-CM

## 2016-04-22 ENCOUNTER — Telehealth: Payer: Self-pay | Admitting: Family Medicine

## 2016-04-22 NOTE — Telephone Encounter (Signed)
Received call from [place] needing RX faxed back. It is currently in fax in basket at front office to be signed and returned. Pt has appt tomorrow 04/23/16.

## 2016-04-22 NOTE — Telephone Encounter (Signed)
Rx faxed.    KP 

## 2016-04-23 ENCOUNTER — Ambulatory Visit
Admission: RE | Admit: 2016-04-23 | Discharge: 2016-04-23 | Disposition: A | Discharge status: Home / Self Care | Payer: Medicare Other | Source: Ambulatory Visit | Attending: Family Medicine | Admitting: Family Medicine

## 2016-04-23 DIAGNOSIS — R928 Other abnormal and inconclusive findings on diagnostic imaging of breast: Secondary | ICD-10-CM

## 2016-04-23 IMAGING — MG 2D DIGITAL DIAGNOSTIC UNILATERAL LEFT MAMMOGRAM WITH CAD AND ADJ
6 series · 6 of 14 positions shown · non-contrast
Comparison: Previous examinations.

CLINICAL DATA: Possible asymmetry in the outer left breast on the
craniocaudal view of a recent 2D screening mammogram.

EXAM:
2D DIGITAL DIAGNOSTIC UNILATERAL LEFT MAMMOGRAM WITH CAD AND ADJUNCT
TOMO

[L CC]
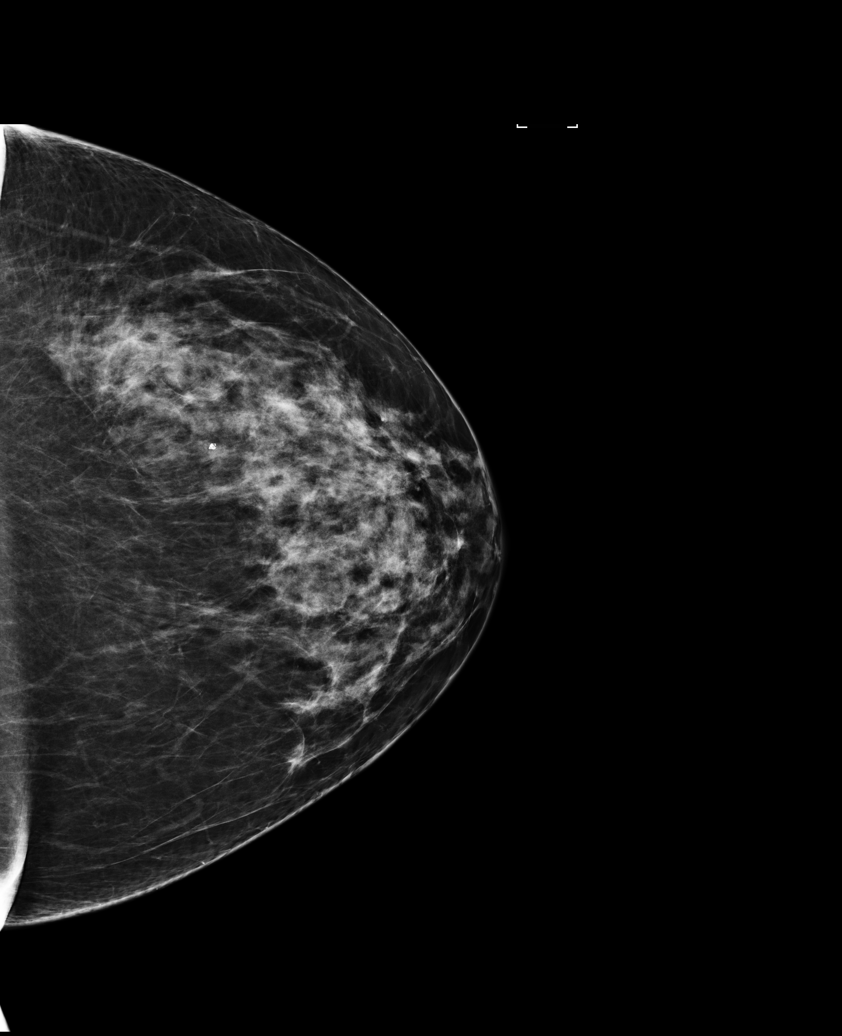

[L MLO synth-2D]
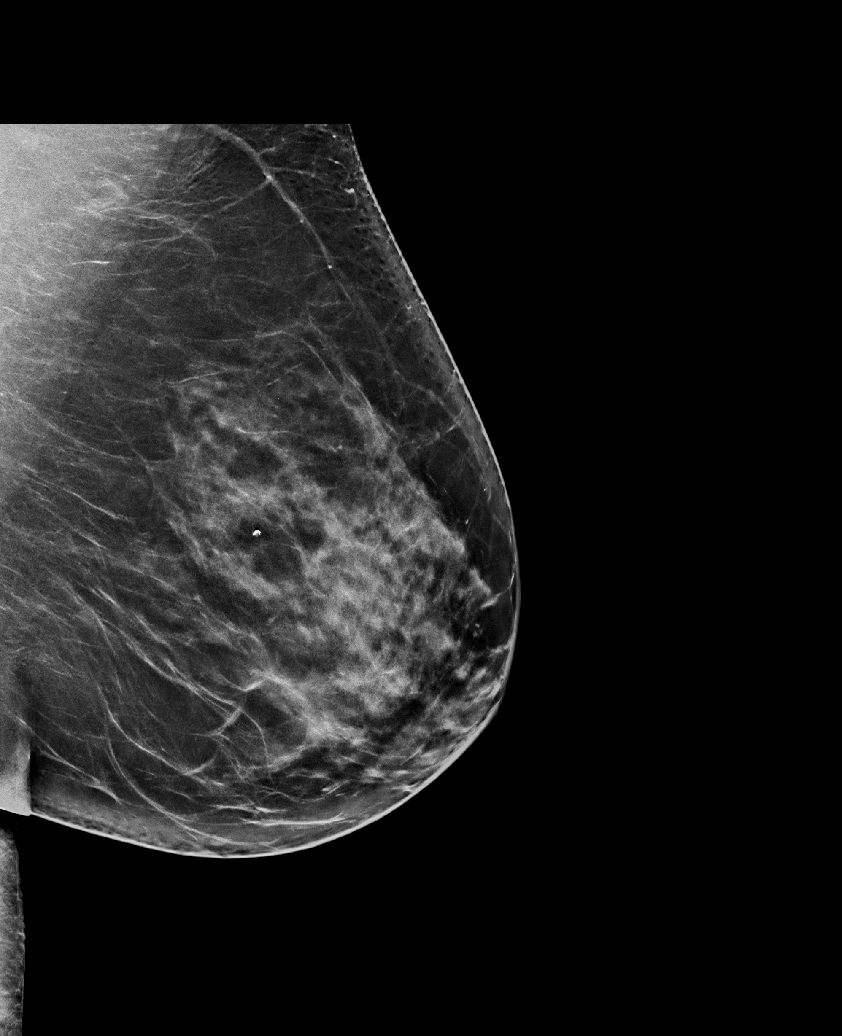

[L CC synth-2D]
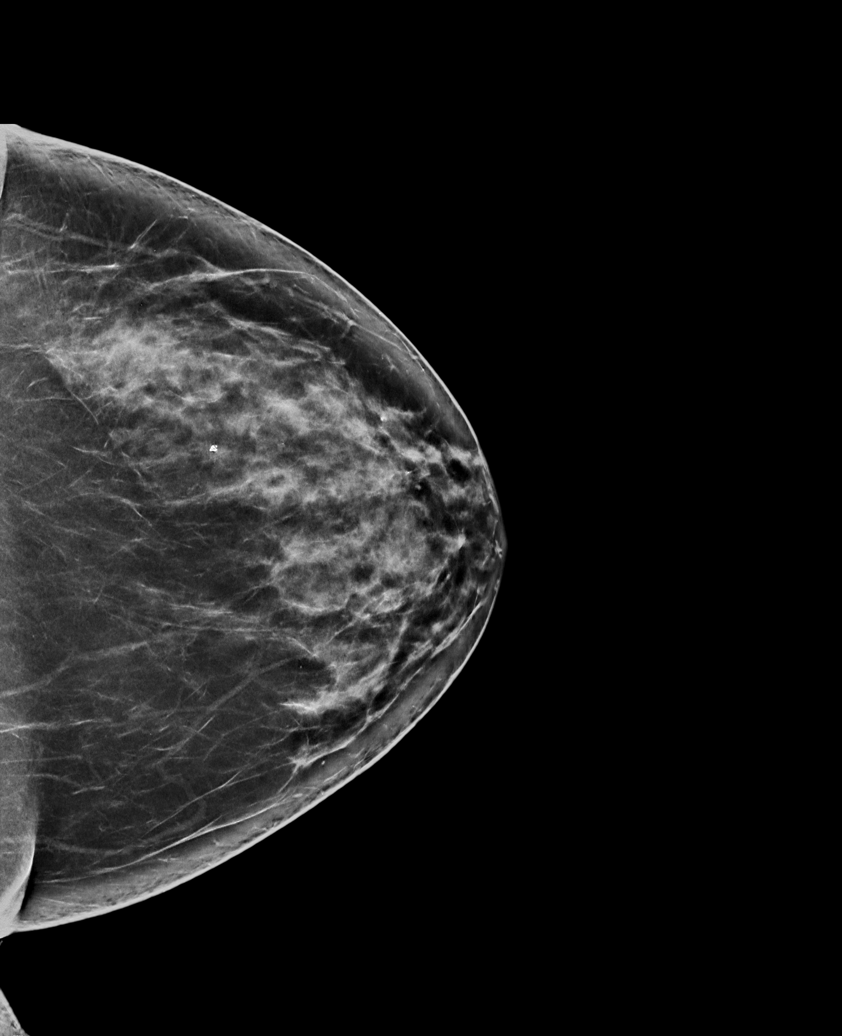

[L MLO]
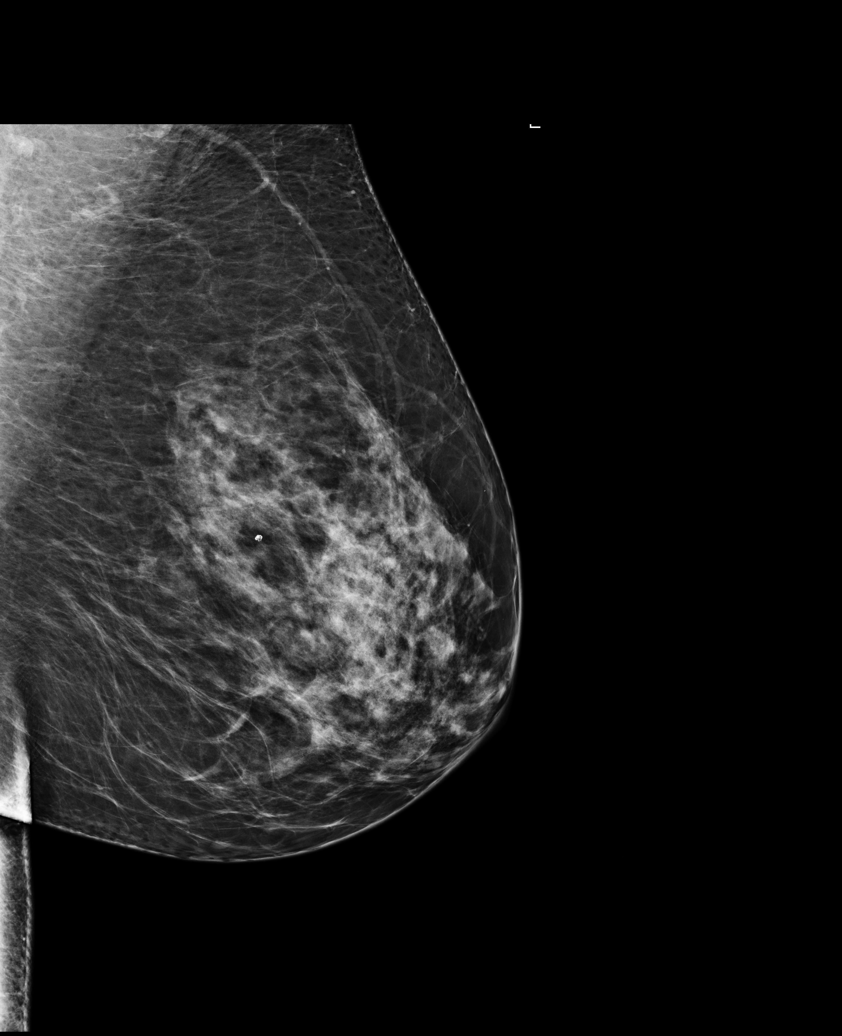

[L CC tomo · tomo slice 43/86.0]
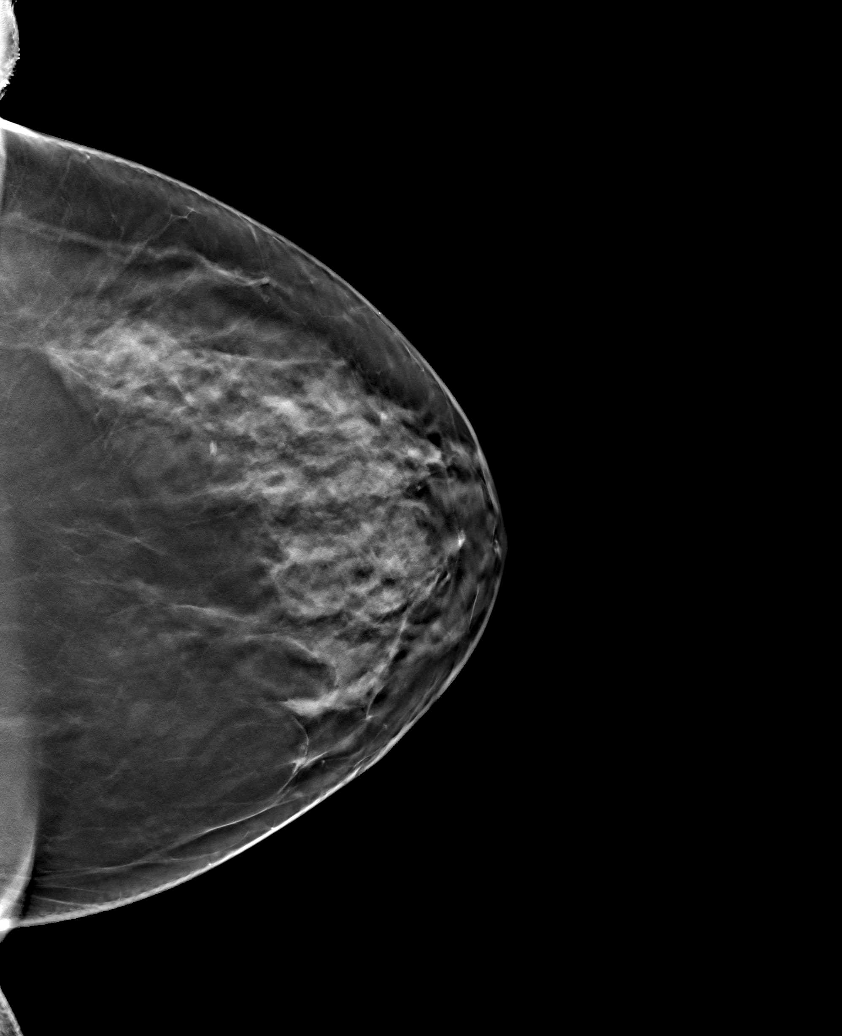

[L MLO tomo · tomo slice 45/88.0]
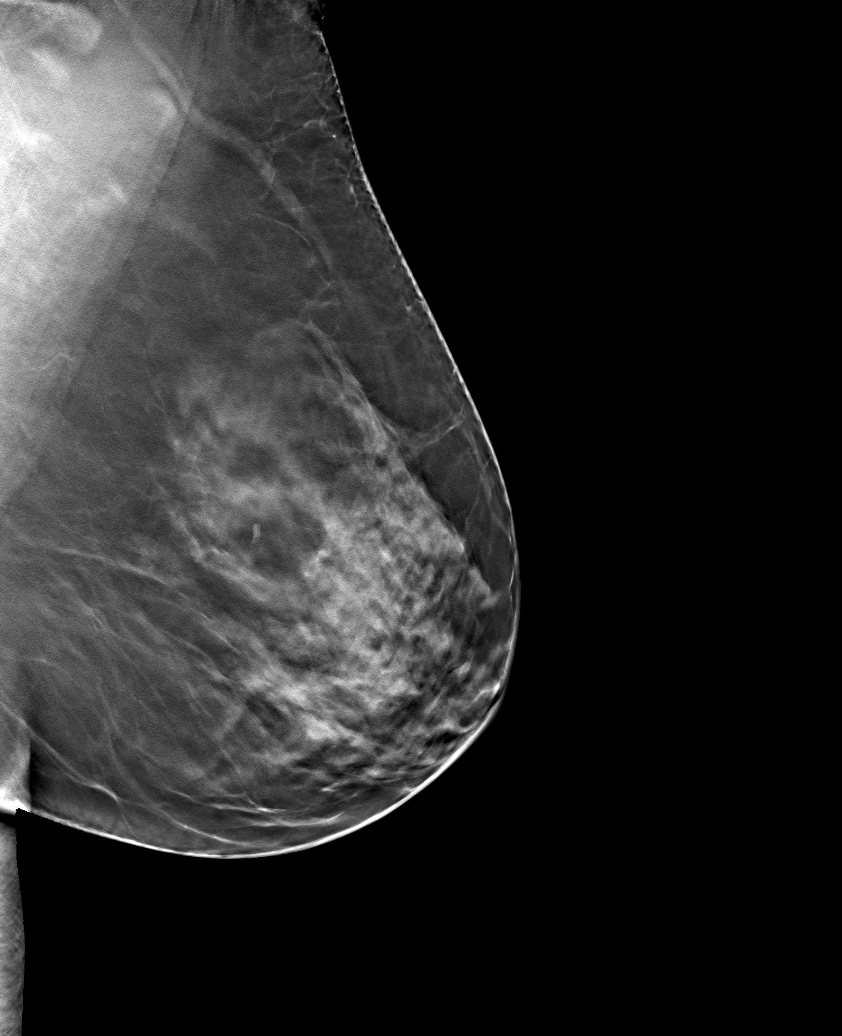

[6 of 14 positions shown; findings below may reference images not displayed]

ACR Breast Density Category c: The breast tissue is heterogeneously
dense, which may obscure small masses.
FINDINGS: 2D and 3D tomographic images of the left breast were obtained. These
demonstrate normal appearing fibroglandular tissue at the location
of recently suspected asymmetry in the outer portion of the breast.
The there are no findings suspicious for malignancy.

Mammographic images were processed with CAD.
IMPRESSION: No evidence of malignancy. The recently suspected asymmetry in the
outer left breast was overlapping of normal fibroglandular tissue.

RECOMMENDATION:
Bilateral screening mammogram in 1 year.

I have discussed the findings and recommendations with the patient.
Results were also provided in writing at the conclusion of the
visit. If applicable, a reminder letter will be sent to the patient
regarding the next appointment.

BI-RADS CATEGORY  1: Negative.

## 2016-05-03 ENCOUNTER — Encounter: Payer: Self-pay | Admitting: Family Medicine

## 2016-05-03 ENCOUNTER — Ambulatory Visit (INDEPENDENT_AMBULATORY_CARE_PROVIDER_SITE_OTHER): Payer: Medicare Other | Admitting: Family Medicine

## 2016-05-03 VITALS — BP 162/100 | HR 87 | Temp 98.4°F | Resp 16 | Ht 65.0 in | Wt 209.6 lb

## 2016-05-03 DIAGNOSIS — Z0000 Encounter for general adult medical examination without abnormal findings: Secondary | ICD-10-CM | POA: Diagnosis not present

## 2016-05-03 DIAGNOSIS — Z23 Encounter for immunization: Secondary | ICD-10-CM

## 2016-05-03 DIAGNOSIS — E2839 Other primary ovarian failure: Secondary | ICD-10-CM

## 2016-05-03 DIAGNOSIS — I10 Essential (primary) hypertension: Secondary | ICD-10-CM | POA: Diagnosis not present

## 2016-05-03 DIAGNOSIS — E785 Hyperlipidemia, unspecified: Secondary | ICD-10-CM

## 2016-05-03 LAB — POCT URINALYSIS DIPSTICK
BILIRUBIN UA: NEGATIVE
GLUCOSE UA: NEGATIVE
KETONES UA: NEGATIVE
Leukocytes, UA: NEGATIVE
Nitrite, UA: NEGATIVE
PH UA: 6
Protein, UA: NEGATIVE
RBC UA: NEGATIVE
Spec Grav, UA: >=1.03
Urobilinogen, UA: 0.2

## 2016-05-03 LAB — CBC WITH DIFFERENTIAL/PLATELET
BASOS ABS: 0 10*3/uL (ref 0.0–0.1)
Basophils Relative: 0.3 % (ref 0.0–3.0)
EOS ABS: 0.1 10*3/uL (ref 0.0–0.7)
Eosinophils Relative: 1.5 % (ref 0.0–5.0)
HEMATOCRIT: 40.7 % (ref 36.0–46.0)
HEMOGLOBIN: 13.8 g/dL (ref 12.0–15.0)
LYMPHS PCT: 30.8 % (ref 12.0–46.0)
Lymphs Abs: 2 10*3/uL (ref 0.7–4.0)
MCHC: 33.9 g/dL (ref 30.0–36.0)
MCV: 85.1 fl (ref 78.0–100.0)
MONO ABS: 0.5 10*3/uL (ref 0.1–1.0)
Monocytes Relative: 7.2 % (ref 3.0–12.0)
NEUTROS PCT: 60.2 % (ref 43.0–77.0)
Neutro Abs: 3.9 10*3/uL (ref 1.4–7.7)
PLATELETS: 177 10*3/uL (ref 150.0–400.0)
RBC: 4.78 Mil/uL (ref 3.87–5.11)
RDW: 13.1 % (ref 11.5–15.5)
WBC: 6.5 10*3/uL (ref 4.0–10.5)

## 2016-05-03 LAB — COMPREHENSIVE METABOLIC PANEL
ALBUMIN: 4.6 g/dL (ref 3.5–5.2)
ALK PHOS: 73 U/L (ref 39–117)
ALT: 23 U/L (ref 0–35)
AST: 20 U/L (ref 0–37)
BUN: 17 mg/dL (ref 6–23)
CALCIUM: 10.1 mg/dL (ref 8.4–10.5)
CO2: 32 mEq/L (ref 19–32)
CREATININE: 0.86 mg/dL (ref 0.40–1.20)
Chloride: 103 mEq/L (ref 96–112)
GFR: 70.3 mL/min (ref >60.00)
Glucose, Bld: 94 mg/dL (ref 70–99)
Potassium: 4.2 mEq/L (ref 3.5–5.1)
SODIUM: 142 meq/L (ref 135–145)
TOTAL PROTEIN: 7.1 g/dL (ref 6.0–8.3)
Total Bilirubin: 0.7 mg/dL (ref 0.2–1.2)

## 2016-05-03 LAB — LIPID PANEL
CHOLESTEROL: 160 mg/dL (ref 0–200)
HDL: 61.5 mg/dL (ref >39.00)
LDL Cholesterol: 70 mg/dL (ref 0–99)
NonHDL: 98.79
TRIGLYCERIDES: 143 mg/dL (ref 0.0–149.0)
Total CHOL/HDL Ratio: 3
VLDL: 28.6 mg/dL (ref 0.0–40.0)

## 2016-05-03 NOTE — Progress Notes (Signed)
Pre visit review using our clinic review tool, if applicable. No additional management support is needed unless otherwise documented below in the visit note. 

## 2016-05-03 NOTE — Progress Notes (Signed)
Subjective:    Heather Pierce is a 65 y.o. female who presents for a Welcome to Medicare exam.   Review of Systems  Review of Systems  Constitutional: Negative for activity change, appetite change and fatigue.  HENT: Negative for hearing loss, congestion, tinnitus and ear discharge.   Eyes: Negative for visual disturbance (see optho q1y -- vision corrected to 20/20 with glasses).  Respiratory: Negative for cough, chest tightness and shortness of breath.   Cardiovascular: Negative for chest pain, palpitations and leg swelling.  Gastrointestinal: Negative for abdominal pain, diarrhea, constipation and abdominal distention.  Genitourinary: Negative for urgency, frequency, decreased urine volume and difficulty urinating.  Musculoskeletal: Negative for back pain, arthralgias and gait problem.  Skin: Negative for color change, pallor and rash.  Neurological: Negative for dizziness, light-headedness, numbness and headaches.  Hematological: Negative for adenopathy. Does not bruise/bleed easily.  Psychiatric/Behavioral: Negative for suicidal ideas, confusion, sleep disturbance, self-injury, dysphoric mood, decreased concentration and agitation.  Pt is able to read and write and can do all ADLs No risk for falling No abuse/ violence in home          Objective:    Today's Vitals   05/03/16 0915 05/03/16 0924  BP: (!) 160/100 (!) 162/100  Pulse: 87   Resp: 16   Temp: 98.4 F (36.9 C)   TempSrc: Oral   SpO2: 97%   Weight: 209 lb 9.6 oz (95.1 kg)   Height: 5\' 5"  (1.651 m)   Body mass index is 34.88 kg/m.  Medications Outpatient Encounter Prescriptions as of 05/03/2016  Medication Sig  . hydrochlorothiazide (HYDRODIURIL) 25 MG tablet TAKE 1 TABLET (25 MG TOTAL) BY MOUTH DAILY.  . simvastatin (ZOCOR) 40 MG tablet TAKE 1 TABLET (40 MG TOTAL) BY MOUTH DAILY. REPEAT LABS ARE DUE NOW   No facility-administered encounter medications on file as of 05/03/2016.      History: Past  Medical History:  Diagnosis Date  . Allergy   . GERD (gastroesophageal reflux disease)   . Hyperlipidemia   . Hypertension    Past Surgical History:  Procedure Laterality Date  . KNEE ARTHROSCOPY  11/2009   left     Family History  Problem Relation Age of Onset  . Jaundice Father   . GER disease Father   . COPD Father   . Dementia Father   . Osteoarthritis Father     In nursing home  . Cancer Father     melanoma  . Alzheimer's disease Father   . Colon polyps Father   . Alzheimer's disease Mother   . Dementia Mother   . Arthritis    . Hyperlipidemia    . Hypertension    . Coronary artery disease      1st degree relative @60s   . Melanoma    . Colon cancer Maternal Grandmother 84   Social History   Occupational History  . Passenger transport manager     [employer]  .  [employer]   Social History Main Topics  . Smoking status: Never Smoker  . Smokeless tobacco: Never Used  . Alcohol use Yes     Comment: occ. maybe 2 drinks per month per pt.  . Drug use: No  . Sexual activity: No    Tobacco Counseling Counseling given: Not Answered   Immunizations and Health Maintenance Immunization History  Administered Date(s) Administered  . Influenza,inj,Quad PF,36+ Mos 05/04/2013, 03/25/2016  . Influenza-Unspecified 06/01/2011, 03/22/2014, 03/21/2015  . Td 04/18/2009  . Zoster 11/12/2012   Health  Maintenance Due  Topic Date Due  . PAP SMEAR  09/24/2013  . PNA vac Low Risk Adult (1 of 2 - PCV13) 12/14/2015    Activities of Daily Living In your present state of health, do you have any difficulty performing the following activities: 05/03/2016  Hearing? N  Vision? N  Difficulty concentrating or making decisions? N  Walking or climbing stairs? N  Dressing or bathing? N  Doing errands, shopping? N  Some recent data might be hidden    Physical Exam   BP (!) 162/100 (BP Location: Right Arm, Patient Position: Sitting, Cuff Size: Large)   Pulse 87   Temp 98.4 F (36.9 C)  (Oral)   Resp 16   Ht 5\' 5"  (1.651 m)   Wt 209 lb 9.6 oz (95.1 kg)   LMP 09/24/2005   SpO2 97%   BMI 34.88 kg/m  General appearance: alert, cooperative, appears stated age and no distress Head: Normocephalic, without obvious abnormality, atraumatic Eyes: conjunctivae/corneas clear. PERRL, EOM's intact. Fundi benign. Ears: normal TM's and external ear canals both ears Nose: Nares normal. Septum midline. Mucosa normal. No drainage or sinus tenderness. Throat: lips, mucosa, and tongue normal; teeth and gums normal Neck: no adenopathy, no carotid bruit, no JVD, supple, symmetrical, trachea midline and thyroid not enlarged, symmetric, no tenderness/mass/nodules Back: symmetric, no curvature. ROM normal. No CVA tenderness. Lungs: clear to auscultation bilaterally Breasts: normal appearance, no masses or tenderness Heart: regular rate and rhythm, S1, S2 normal, no murmur, click, rub or gallop Abdomen: soft, non-tender; bowel sounds normal; no masses,  no organomegaly Pelvic: not indicated; post-menopausal, no abnormal Pap smears in past Extremities: extremities normal, atraumatic, no cyanosis or edema Pulses: 2+ and symmetric Skin: Skin color, texture, turgor normal. No rashes or lesions Lymph nodes: Cervical, supraclavicular, and axillary nodes normal. Neurologic: Alert and oriented X 3, normal strength and tone. Normal symmetric reflexes. Normal coordination and gait  Advanced Directives: Does patient have an advance directive?: No Would patient like information on creating an advanced directive?: No - patient declined information, Yes - Educational materials given    Assessment:    This is a routine wellness examination for this patient .  Vision/Hearing screen Hearing Screening Comments: Whisper normal Vision Screening Comments: opth--annually  Dietary issues and exercise activities discussed:  Current Exercise Habits: Structured exercise class, Type of exercise: Other - see  comments (water aerobics), Time (Minutes): 60, Frequency (Times/Week): 3, Weekly Exercise (Minutes/Week): 180, Intensity: Mild, Exercise limited by: None identified  Goals    None     Depression Screen PHQ 2/9 Scores 05/03/2016 11/12/2012  PHQ - 2 Score 0 0     Fall Risk Fall Risk  05/03/2016  Falls in the past year? Yes    Cognitive Function: MMSE - Mini Mental State Exam 05/03/2016  Orientation to time 5  Orientation to Place 5  Registration 3  Attention/ Calculation 5  Recall 3  Language- name 2 objects 2  Language- repeat 1  Language- follow 3 step command 3  Language- read & follow direction 1  Write a sentence 1  Copy design 1  Total score 30        Patient Care Team: Ann Held, DO as PCP - General     Plan:     During the course of the visit the patient was educated and counseled about the following appropriate screening and preventive services:   Vaccines to include Pneumoccal, Influenza, Hepatitis B, Td, Zostavax, HCV  Electrocardiogram  Cardiovascular Disease  Colorectal cancer screening  Bone density screening  Diabetes screening  Glaucoma screening  Mammography/PAP  Nutrition counseling  Her current medications and allergies were reviewed and needed refills of her chronic medications were ordered. The plan for yearly health maintenance was discussed and all orders and referrals were made as appropriate.  Patient Instructions (the written plan) was given to the patient.   1. Preventative health care See above  2. Hyperlipidemia LDL goal <100 Check labs con't zocor  3. Essential hypertension Stable con't hctz  4. Estrogen deficiency  - DG Bone Density; Future  [place], DO 05/03/2016

## 2016-05-03 NOTE — Patient Instructions (Signed)
Preventive Care for Adults, Female A healthy lifestyle and preventive care can promote health and wellness. Preventive health guidelines for women include the following key practices.  A routine yearly physical is a good way to check with your health care provider about your health and preventive screening. It is a chance to share any concerns and updates on your health and to receive a thorough exam.  Visit your dentist for a routine exam and preventive care every 6 months. Brush your teeth twice a day and floss once a day. Good oral hygiene prevents tooth decay and gum disease.  The frequency of eye exams is based on your age, health, family medical history, use of contact lenses, and other factors. Follow your health care provider's recommendations for frequency of eye exams.  Eat a healthy diet. Foods like vegetables, fruits, whole grains, low-fat dairy products, and lean protein foods contain the nutrients you need without too many calories. Decrease your intake of foods high in solid fats, added sugars, and salt. Eat the right amount of calories for you.Get information about a proper diet from your health care provider, if necessary.  Regular physical exercise is one of the most important things you can do for your health. Most adults should get at least 150 minutes of moderate-intensity exercise (any activity that increases your heart rate and causes you to sweat) each week. In addition, most adults need muscle-strengthening exercises on 2 or more days a week.  Maintain a healthy weight. The body mass index (BMI) is a screening tool to identify possible weight problems. It provides an estimate of body fat based on height and weight. Your health care provider can find your BMI and can help you achieve or maintain a healthy weight.For adults 20 years and older:  A BMI below 18.5 is considered underweight.  A BMI of 18.5 to 24.9 is normal.  A BMI of 25 to 29.9 is considered overweight.  A  BMI of 30 and above is considered obese.  Maintain normal blood lipids and cholesterol levels by exercising and minimizing your intake of saturated fat. Eat a balanced diet with plenty of fruit and vegetables. Blood tests for lipids and cholesterol should begin at age 45 and be repeated every 5 years. If your lipid or cholesterol levels are high, you are over 50, or you are at high risk for heart disease, you may need your cholesterol levels checked more frequently.Ongoing high lipid and cholesterol levels should be treated with medicines if diet and exercise are not working.  If you smoke, find out from your health care provider how to quit. If you do not use tobacco, do not start.  Lung cancer screening is recommended for adults aged 45-80 years who are at high risk for developing lung cancer because of a history of smoking. A yearly low-dose CT scan of the lungs is recommended for people who have at least a 30-pack-year history of smoking and are a current smoker or have quit within the past 15 years. A pack year of smoking is smoking an average of 1 pack of cigarettes a day for 1 year (for example: 1 pack a day for 30 years or 2 packs a day for 15 years). Yearly screening should continue until the smoker has stopped smoking for at least 15 years. Yearly screening should be stopped for people who develop a health problem that would prevent them from having lung cancer treatment.  If you are pregnant, do not drink alcohol. If you are  breastfeeding, be very cautious about drinking alcohol. If you are not pregnant and choose to drink alcohol, do not have more than 1 drink per day. One drink is considered to be 12 ounces (355 mL) of beer, 5 ounces (148 mL) of wine, or 1.5 ounces (44 mL) of liquor.  Avoid use of street drugs. Do not share needles with anyone. Ask for help if you need support or instructions about stopping the use of drugs.  High blood pressure causes heart disease and increases the risk  of stroke. Your blood pressure should be checked at least every 1 to 2 years. Ongoing high blood pressure should be treated with medicines if weight loss and exercise do not work.  If you are 55-79 years old, ask your health care provider if you should take aspirin to prevent strokes.  Diabetes screening is done by taking a blood sample to check your blood glucose level after you have not eaten for a certain period of time (fasting). If you are not overweight and you do not have risk factors for diabetes, you should be screened once every 3 years starting at age 45. If you are overweight or obese and you are 40-70 years of age, you should be screened for diabetes every year as part of your cardiovascular risk assessment.  Breast cancer screening is essential preventive care for women. You should practice "breast self-awareness." This means understanding the normal appearance and feel of your breasts and may include breast self-examination. Any changes detected, no matter how small, should be reported to a health care provider. Women in their 20s and 30s should have a clinical breast exam (CBE) by a health care provider as part of a regular health exam every 1 to 3 years. After age 40, women should have a CBE every year. Starting at age 40, women should consider having a mammogram (breast X-ray test) every year. Women who have a family history of breast cancer should talk to their health care provider about genetic screening. Women at a high risk of breast cancer should talk to their health care providers about having an MRI and a mammogram every year.  Breast cancer gene (BRCA)-related cancer risk assessment is recommended for women who have family members with BRCA-related cancers. BRCA-related cancers include breast, ovarian, tubal, and peritoneal cancers. Having family members with these cancers may be associated with an increased risk for harmful changes (mutations) in the breast cancer genes BRCA1 and  BRCA2. Results of the assessment will determine the need for genetic counseling and BRCA1 and BRCA2 testing.  Your health care provider may recommend that you be screened regularly for cancer of the pelvic organs (ovaries, uterus, and vagina). This screening involves a pelvic examination, including checking for microscopic changes to the surface of your cervix (Pap test). You may be encouraged to have this screening done every 3 years, beginning at age 21.  For women ages 30-65, health care providers may recommend pelvic exams and Pap testing every 3 years, or they may recommend the Pap and pelvic exam, combined with testing for human papilloma virus (HPV), every 5 years. Some types of HPV increase your risk of cervical cancer. Testing for HPV may also be done on women of any age with unclear Pap test results.  Other health care providers may not recommend any screening for nonpregnant women who are considered low risk for pelvic cancer and who do not have symptoms. Ask your health care provider if a screening pelvic exam is right for   you.  If you have had past treatment for cervical cancer or a condition that could lead to cancer, you need Pap tests and screening for cancer for at least 20 years after your treatment. If Pap tests have been discontinued, your risk factors (such as having a new sexual partner) need to be reassessed to determine if screening should resume. Some women have medical problems that increase the chance of getting cervical cancer. In these cases, your health care provider may recommend more frequent screening and Pap tests.  Colorectal cancer can be detected and often prevented. Most routine colorectal cancer screening begins at the age of 50 years and continues through age 75 years. However, your health care provider may recommend screening at an earlier age if you have risk factors for colon cancer. On a yearly basis, your health care provider may provide home test kits to check  for hidden blood in the stool. Use of a small camera at the end of a tube, to directly examine the colon (sigmoidoscopy or colonoscopy), can detect the earliest forms of colorectal cancer. Talk to your health care provider about this at age 50, when routine screening begins. Direct exam of the colon should be repeated every 5-10 years through age 75 years, unless early forms of precancerous polyps or small growths are found.  People who are at an increased risk for hepatitis B should be screened for this virus. You are considered at high risk for hepatitis B if:  You were born in a country where hepatitis B occurs often. Talk with your health care provider about which countries are considered high risk.  Your parents were born in a high-risk country and you have not received a shot to protect against hepatitis B (hepatitis B vaccine).  You have HIV or AIDS.  You use needles to inject street drugs.  You live with, or have sex with, someone who has hepatitis B.  You get hemodialysis treatment.  You take certain medicines for conditions like cancer, organ transplantation, and autoimmune conditions.  Hepatitis C blood testing is recommended for all people born from 1945 through 1965 and any individual with known risks for hepatitis C.  Practice safe sex. Use condoms and avoid high-risk sexual practices to reduce the spread of sexually transmitted infections (STIs). STIs include gonorrhea, chlamydia, syphilis, trichomonas, herpes, HPV, and human immunodeficiency virus (HIV). Herpes, HIV, and HPV are viral illnesses that have no cure. They can result in disability, cancer, and death.  You should be screened for sexually transmitted illnesses (STIs) including gonorrhea and chlamydia if:  You are sexually active and are younger than 24 years.  You are older than 24 years and your health care provider tells you that you are at risk for this type of infection.  Your sexual activity has changed  since you were last screened and you are at an increased risk for chlamydia or gonorrhea. Ask your health care provider if you are at risk.  If you are at risk of being infected with HIV, it is recommended that you take a prescription medicine daily to prevent HIV infection. This is called preexposure prophylaxis (PrEP). You are considered at risk if:  You are sexually active and do not regularly use condoms or know the HIV status of your partner(s).  You take drugs by injection.  You are sexually active with a partner who has HIV.  Talk with your health care provider about whether you are at high risk of being infected with HIV. If   you choose to begin PrEP, you should first be tested for HIV. You should then be tested every 3 months for as long as you are taking PrEP.  Osteoporosis is a disease in which the bones lose minerals and strength with aging. This can result in serious bone fractures or breaks. The risk of osteoporosis can be identified using a bone density scan. Women ages 67 years and over and women at risk for fractures or osteoporosis should discuss screening with their health care providers. Ask your health care provider whether you should take a calcium supplement or vitamin D to reduce the rate of osteoporosis.  Menopause can be associated with physical symptoms and risks. Hormone replacement therapy is available to decrease symptoms and risks. You should talk to your health care provider about whether hormone replacement therapy is right for you.  Use sunscreen. Apply sunscreen liberally and repeatedly throughout the day. You should seek shade when your shadow is shorter than you. Protect yourself by wearing long sleeves, pants, a wide-brimmed hat, and sunglasses year round, whenever you are outdoors.  Once a month, do a whole body skin exam, using a mirror to look at the skin on your back. Tell your health care provider of new moles, moles that have irregular borders, moles that  are larger than a pencil eraser, or moles that have changed in shape or color.  Stay current with required vaccines (immunizations).  Influenza vaccine. All adults should be immunized every year.  Tetanus, diphtheria, and acellular pertussis (Td, Tdap) vaccine. Pregnant women should receive 1 dose of Tdap vaccine during each pregnancy. The dose should be obtained regardless of the length of time since the last dose. Immunization is preferred during the 27th-36th week of gestation. An adult who has not previously received Tdap or who does not know her vaccine status should receive 1 dose of Tdap. This initial dose should be followed by tetanus and diphtheria toxoids (Td) booster doses every 10 years. Adults with an unknown or incomplete history of completing a 3-dose immunization series with Td-containing vaccines should begin or complete a primary immunization series including a Tdap dose. Adults should receive a Td booster every 10 years.  Varicella vaccine. An adult without evidence of immunity to varicella should receive 2 doses or a second dose if she has previously received 1 dose. Pregnant females who do not have evidence of immunity should receive the first dose after pregnancy. This first dose should be obtained before leaving the health care facility. The second dose should be obtained 4-8 weeks after the first dose.  Human papillomavirus (HPV) vaccine. Females aged 13-26 years who have not received the vaccine previously should obtain the 3-dose series. The vaccine is not recommended for use in pregnant females. However, pregnancy testing is not needed before receiving a dose. If a female is found to be pregnant after receiving a dose, no treatment is needed. In that case, the remaining doses should be delayed until after the pregnancy. Immunization is recommended for any person with an immunocompromised condition through the age of 61 years if she did not get any or all doses earlier. During the  3-dose series, the second dose should be obtained 4-8 weeks after the first dose. The third dose should be obtained 24 weeks after the first dose and 16 weeks after the second dose.  Zoster vaccine. One dose is recommended for adults aged 30 years or older unless certain conditions are present.  Measles, mumps, and rubella (MMR) vaccine. Adults born  before 1957 generally are considered immune to measles and mumps. Adults born in 1957 or later should have 1 or more doses of MMR vaccine unless there is a contraindication to the vaccine or there is laboratory evidence of immunity to each of the three diseases. A routine second dose of MMR vaccine should be obtained at least 28 days after the first dose for students attending postsecondary schools, health care workers, or international travelers. People who received inactivated measles vaccine or an unknown type of measles vaccine during 1963-1967 should receive 2 doses of MMR vaccine. People who received inactivated mumps vaccine or an unknown type of mumps vaccine before 1979 and are at high risk for mumps infection should consider immunization with 2 doses of MMR vaccine. For females of childbearing age, rubella immunity should be determined. If there is no evidence of immunity, females who are not pregnant should be vaccinated. If there is no evidence of immunity, females who are pregnant should delay immunization until after pregnancy. Unvaccinated health care workers born before 1957 who lack laboratory evidence of measles, mumps, or rubella immunity or laboratory confirmation of disease should consider measles and mumps immunization with 2 doses of MMR vaccine or rubella immunization with 1 dose of MMR vaccine.  Pneumococcal 13-valent conjugate (PCV13) vaccine. When indicated, a person who is uncertain of his immunization history and has no record of immunization should receive the PCV13 vaccine. All adults 65 years of age and older should receive this  vaccine. An adult aged 19 years or older who has certain medical conditions and has not been previously immunized should receive 1 dose of PCV13 vaccine. This PCV13 should be followed with a dose of pneumococcal polysaccharide (PPSV23) vaccine. Adults who are at high risk for pneumococcal disease should obtain the PPSV23 vaccine at least 8 weeks after the dose of PCV13 vaccine. Adults older than 65 years of age who have normal immune system function should obtain the PPSV23 vaccine dose at least 1 year after the dose of PCV13 vaccine.  Pneumococcal polysaccharide (PPSV23) vaccine. When PCV13 is also indicated, PCV13 should be obtained first. All adults aged 65 years and older should be immunized. An adult younger than age 65 years who has certain medical conditions should be immunized. Any person who resides in a nursing home or long-term care facility should be immunized. An adult smoker should be immunized. People with an immunocompromised condition and certain other conditions should receive both PCV13 and PPSV23 vaccines. People with human immunodeficiency virus (HIV) infection should be immunized as soon as possible after diagnosis. Immunization during chemotherapy or radiation therapy should be avoided. Routine use of PPSV23 vaccine is not recommended for American Indians, [place] Natives, or people younger than 65 years unless there are medical conditions that require PPSV23 vaccine. When indicated, people who have unknown immunization and have no record of immunization should receive PPSV23 vaccine. One-time revaccination 5 years after the first dose of PPSV23 is recommended for people aged 19-64 years who have chronic kidney failure, nephrotic syndrome, asplenia, or immunocompromised conditions. People who received 1-2 doses of PPSV23 before age 65 years should receive another dose of PPSV23 vaccine at age 65 years or later if at least 5 years have passed since the previous dose. Doses of PPSV23 are not  needed for people immunized with PPSV23 at or after age 65 years.  Meningococcal vaccine. Adults with asplenia or persistent complement component deficiencies should receive 2 doses of quadrivalent meningococcal conjugate (MenACWY-D) vaccine. The doses should be obtained   at least 2 months apart. Microbiologists working with certain meningococcal bacteria, [place] recruits, people at risk during an outbreak, and people who travel to or live in countries with a high rate of meningitis should be immunized. A first-year college student up through age 34 years who is living in a residence hall should receive a dose if she did not receive a dose on or after her 16th birthday. Adults who have certain high-risk conditions should receive one or more doses of vaccine.  Hepatitis A vaccine. Adults who wish to be protected from this disease, have certain high-risk conditions, work with hepatitis A-infected animals, work in hepatitis A research labs, or travel to or work in countries with a high rate of hepatitis A should be immunized. Adults who were previously unvaccinated and who anticipate close contact with an international adoptee during the first 60 days after arrival in the [place] from a country with a high rate of hepatitis A should be immunized.  Hepatitis B vaccine. Adults who wish to be protected from this disease, have certain high-risk conditions, may be exposed to blood or other infectious body fluids, are household contacts or sex partners of hepatitis B positive people, are clients or workers in certain care facilities, or travel to or work in countries with a high rate of hepatitis B should be immunized.  Haemophilus influenzae type b (Hib) vaccine. A previously unvaccinated person with asplenia or sickle cell disease or having a scheduled splenectomy should receive 1 dose of Hib vaccine. Regardless of previous immunization, a recipient of a hematopoietic stem cell transplant should receive a  3-dose series 6-12 months after her successful transplant. Hib vaccine is not recommended for adults with HIV infection. Preventive Services / Frequency Ages 35 to 4 years  Blood pressure check.** / Every 3-5 years.  Lipid and cholesterol check.** / Every 5 years beginning at age 60.  Clinical breast exam.** / Every 3 years for women in their 71s and 10s.  BRCA-related cancer risk assessment.** / For women who have family members with a BRCA-related cancer (breast, ovarian, tubal, or peritoneal cancers).  Pap test.** / Every 2 years from ages 76 through 26. Every 3 years starting at age 61 through age 76 or 93 with a history of 3 consecutive normal Pap tests.  HPV screening.** / Every 3 years from ages 37 through ages 60 to 51 with a history of 3 consecutive normal Pap tests.  Hepatitis C blood test.** / For any individual with known risks for hepatitis C.  Skin self-exam. / Monthly.  Influenza vaccine. / Every year.  Tetanus, diphtheria, and acellular pertussis (Tdap, Td) vaccine.** / Consult your health care provider. Pregnant women should receive 1 dose of Tdap vaccine during each pregnancy. 1 dose of Td every 10 years.  Varicella vaccine.** / Consult your health care provider. Pregnant females who do not have evidence of immunity should receive the first dose after pregnancy.  HPV vaccine. / 3 doses over 6 months, if 93 and younger. The vaccine is not recommended for use in pregnant females. However, pregnancy testing is not needed before receiving a dose.  Measles, mumps, rubella (MMR) vaccine.** / You need at least 1 dose of MMR if you were born in 1957 or later. You may also need a 2nd dose. For females of childbearing age, rubella immunity should be determined. If there is no evidence of immunity, females who are not pregnant should be vaccinated. If there is no evidence of immunity, females who are  pregnant should delay immunization until after pregnancy.  Pneumococcal  13-valent conjugate (PCV13) vaccine.** / Consult your health care provider.  Pneumococcal polysaccharide (PPSV23) vaccine.** / 1 to 2 doses if you smoke cigarettes or if you have certain conditions.  Meningococcal vaccine.** / 1 dose if you are age 68 to 8 years and a Market researcher living in a residence hall, or have one of several medical conditions, you need to get vaccinated against meningococcal disease. You may also need additional booster doses.  Hepatitis A vaccine.** / Consult your health care provider.  Hepatitis B vaccine.** / Consult your health care provider.  Haemophilus influenzae type b (Hib) vaccine.** / Consult your health care provider. Ages 7 to 53 years  Blood pressure check.** / Every year.  Lipid and cholesterol check.** / Every 5 years beginning at age 25 years.  Lung cancer screening. / Every year if you are aged 11-80 years and have a 30-pack-year history of smoking and currently smoke or have quit within the past 15 years. Yearly screening is stopped once you have quit smoking for at least 15 years or develop a health problem that would prevent you from having lung cancer treatment.  Clinical breast exam.** / Every year after age 48 years.  BRCA-related cancer risk assessment.** / For women who have family members with a BRCA-related cancer (breast, ovarian, tubal, or peritoneal cancers).  Mammogram.** / Every year beginning at age 41 years and continuing for as long as you are in good health. Consult with your health care provider.  Pap test.** / Every 3 years starting at age 65 years through age 37 or 70 years with a history of 3 consecutive normal Pap tests.  HPV screening.** / Every 3 years from ages 72 years through ages 60 to 40 years with a history of 3 consecutive normal Pap tests.  Fecal occult blood test (FOBT) of stool. / Every year beginning at age 21 years and continuing until age 5 years. You may not need to do this test if you get  a colonoscopy every 10 years.  Flexible sigmoidoscopy or colonoscopy.** / Every 5 years for a flexible sigmoidoscopy or every 10 years for a colonoscopy beginning at age 35 years and continuing until age 48 years.  Hepatitis C blood test.** / For all people born from [birth year] through 1965 and any individual with known risks for hepatitis C.  Skin self-exam. / Monthly.  Influenza vaccine. / Every year.  Tetanus, diphtheria, and acellular pertussis (Tdap/Td) vaccine.** / Consult your health care provider. Pregnant women should receive 1 dose of Tdap vaccine during each pregnancy. 1 dose of Td every 10 years.  Varicella vaccine.** / Consult your health care provider. Pregnant females who do not have evidence of immunity should receive the first dose after pregnancy.  Zoster vaccine.** / 1 dose for adults aged 30 years or older.  Measles, mumps, rubella (MMR) vaccine.** / You need at least 1 dose of MMR if you were born in 1957 or later. You may also need a second dose. For females of childbearing age, rubella immunity should be determined. If there is no evidence of immunity, females who are not pregnant should be vaccinated. If there is no evidence of immunity, females who are pregnant should delay immunization until after pregnancy.  Pneumococcal 13-valent conjugate (PCV13) vaccine.** / Consult your health care provider.  Pneumococcal polysaccharide (PPSV23) vaccine.** / 1 to 2 doses if you smoke cigarettes or if you have certain conditions.  Meningococcal vaccine.** /  Consult your health care provider.  Hepatitis A vaccine.** / Consult your health care provider.  Hepatitis B vaccine.** / Consult your health care provider.  Haemophilus influenzae type b (Hib) vaccine.** / Consult your health care provider. Ages 64 years and over  Blood pressure check.** / Every year.  Lipid and cholesterol check.** / Every 5 years beginning at age 23 years.  Lung cancer screening. / Every year if you  are aged 16-80 years and have a 30-pack-year history of smoking and currently smoke or have quit within the past 15 years. Yearly screening is stopped once you have quit smoking for at least 15 years or develop a health problem that would prevent you from having lung cancer treatment.  Clinical breast exam.** / Every year after age 74 years.  BRCA-related cancer risk assessment.** / For women who have family members with a BRCA-related cancer (breast, ovarian, tubal, or peritoneal cancers).  Mammogram.** / Every year beginning at age 44 years and continuing for as long as you are in good health. Consult with your health care provider.  Pap test.** / Every 3 years starting at age 58 years through age 22 or 39 years with 3 consecutive normal Pap tests. Testing can be stopped between 65 and 70 years with 3 consecutive normal Pap tests and no abnormal Pap or HPV tests in the past 10 years.  HPV screening.** / Every 3 years from ages 64 years through ages 70 or 61 years with a history of 3 consecutive normal Pap tests. Testing can be stopped between 65 and 70 years with 3 consecutive normal Pap tests and no abnormal Pap or HPV tests in the past 10 years.  Fecal occult blood test (FOBT) of stool. / Every year beginning at age 40 years and continuing until age 27 years. You may not need to do this test if you get a colonoscopy every 10 years.  Flexible sigmoidoscopy or colonoscopy.** / Every 5 years for a flexible sigmoidoscopy or every 10 years for a colonoscopy beginning at age 7 years and continuing until age 32 years.  Hepatitis C blood test.** / For all people born from 65 through 1965 and any individual with known risks for hepatitis C.  Osteoporosis screening.** / A one-time screening for women ages 30 years and over and women at risk for fractures or osteoporosis.  Skin self-exam. / Monthly.  Influenza vaccine. / Every year.  Tetanus, diphtheria, and acellular pertussis (Tdap/Td)  vaccine.** / 1 dose of Td every 10 years.  Varicella vaccine.** / Consult your health care provider.  Zoster vaccine.** / 1 dose for adults aged 35 years or older.  Pneumococcal 13-valent conjugate (PCV13) vaccine.** / Consult your health care provider.  Pneumococcal polysaccharide (PPSV23) vaccine.** / 1 dose for all adults aged 46 years and older.  Meningococcal vaccine.** / Consult your health care provider.  Hepatitis A vaccine.** / Consult your health care provider.  Hepatitis B vaccine.** / Consult your health care provider.  Haemophilus influenzae type b (Hib) vaccine.** / Consult your health care provider. ** Family history and personal history of risk and conditions may change your health care provider's recommendations.   This information is not intended to replace advice given to you by your health care provider. Make sure you discuss any questions you have with your health care provider.   Document Released: 08/13/2001 Document Revised: 07/08/2014 Document Reviewed: 11/12/2010 Elsevier Interactive Patient Education [employer].

## 2016-05-09 NOTE — Progress Notes (Unsigned)
Printed and sent letter with results to patient. LB 

## 2016-05-10 ENCOUNTER — Other Ambulatory Visit: Payer: Self-pay | Admitting: Family Medicine

## 2016-05-14 ENCOUNTER — Ambulatory Visit (HOSPITAL_BASED_OUTPATIENT_CLINIC_OR_DEPARTMENT_OTHER)
Admission: RE | Admit: 2016-05-14 | Discharge: 2016-05-14 | Disposition: A | Discharge status: Home / Self Care | Payer: Medicare Other | Source: Ambulatory Visit | Attending: Family Medicine | Admitting: Family Medicine

## 2016-05-14 DIAGNOSIS — E2839 Other primary ovarian failure: Secondary | ICD-10-CM | POA: Insufficient documentation

## 2016-05-19 ENCOUNTER — Encounter: Payer: Self-pay | Admitting: Family Medicine

## 2016-07-09 ENCOUNTER — Other Ambulatory Visit: Payer: Self-pay | Admitting: Family Medicine

## 2016-09-04 ENCOUNTER — Other Ambulatory Visit: Payer: Self-pay | Admitting: Family Medicine

## 2017-02-07 ENCOUNTER — Ambulatory Visit (INDEPENDENT_AMBULATORY_CARE_PROVIDER_SITE_OTHER): Payer: Medicare Other | Admitting: Family

## 2017-02-07 ENCOUNTER — Ambulatory Visit (INDEPENDENT_AMBULATORY_CARE_PROVIDER_SITE_OTHER): Payer: Medicare Other

## 2017-02-07 ENCOUNTER — Encounter (INDEPENDENT_AMBULATORY_CARE_PROVIDER_SITE_OTHER): Payer: Self-pay | Admitting: Family

## 2017-02-07 VITALS — Ht 65.0 in | Wt 209.0 lb

## 2017-02-07 DIAGNOSIS — M25561 Pain in right knee: Secondary | ICD-10-CM | POA: Diagnosis not present

## 2017-02-07 DIAGNOSIS — G8929 Other chronic pain: Secondary | ICD-10-CM | POA: Diagnosis not present

## 2017-02-13 NOTE — Progress Notes (Signed)
Office Visit Note   Patient: Heather Pierce           Date of Birth: 03-08-1951           MRN: [id number] Visit Date: 02/07/2017              Requested by: [address], [place], [place] [address] [place], [place] [address] PCP: Carollee Herter, Alferd Apa, DO  Chief Complaint  Patient presents with  . Right Knee - Pain    Hx of scope about 9 years ago. Increased pain past week      HPI: The patient is a 66 year old woman who presents a complaining of swelling to her right knee. States this is been ongoing for about a week she complains of global pain popping she's been using ice and elevation with minimal relief. She complains of decreased range of motion difficulty with flexion. States her knee gives way she has stiffness and pain with start up. Has not had any falls.   Of note she did have a arthroscopy 9 years ago for a meniscal tear.  Assessment & Plan: Visit Diagnoses:  1. Chronic pain of right knee     Plan:  Offered Depo-Medrol injection today. She'll with activities as tolerated. Follow up in office in 4 weeks if he fails to improve.  Follow-Up Instructions: Return in about 4 weeks (around 03/07/2017), or if symptoms worsen or fail to improve.   Right Knee Exam   Tenderness  The patient is experiencing tenderness in the lateral joint line and medial joint line.  Range of Motion  The patient has normal right knee ROM.  Muscle Strength   The patient has normal right knee strength.  Tests  Varus: negative Valgus: negative  Other  Swelling: mild Other tests: no effusion present      Patient is alert, oriented, no adenopathy, well-dressed, normal affect, normal respiratory effort.  Imaging: No results found. No images are attached to the encounter.  Labs: Lab Results  Component Value Date   LABORGA NO GROWTH 09/10/2011    Orders:  Orders Placed This Encounter  Procedures  . XR Knee 1-2 Views Right   No orders of the defined types were  placed in this encounter.    Procedures: No procedures performed  Clinical Data: No additional findings.  ROS:  All other systems negative, except as noted in the HPI. Review of Systems  Constitutional: Negative for chills and fever.  Musculoskeletal: Positive for arthralgias and joint swelling.    Objective: Vital Signs: Ht 5\' 5"  (1.651 m)   Wt 209 lb (94.8 kg)   LMP 09/24/2005   BMI 34.78 kg/m   Specialty Comments:  No specialty comments available.  PMFS History: Patient Active Problem List   Diagnosis Date Noted  . Atypical chest pain 04/03/2015  . Obesity (BMI 30-39.9) 07/15/2013  . Bronchitis, acute 07/03/2012  . Hyperlipidemia 09/25/2010  . Preventative health care 09/25/2010  . MUSCLE STRAIN, LEFT BUTTOCK 05/04/2009  . GERD 03/15/2008  . MEDIAL MENISCUS TEAR, RIGHT 03/15/2008  . ABDOMINAL PAIN OTHER SPECIFIED SITE 02/08/2008  . HYPERTENSION 12/17/2007   Past Medical History:  Diagnosis Date  . Allergy   . GERD (gastroesophageal reflux disease)   . Hyperlipidemia   . Hypertension     Family History  Problem Relation Age of Onset  . Jaundice Father   . GER disease Father   . COPD Father   . Dementia Father   . Osteoarthritis Father  In nursing home  . Cancer Father        melanoma  . Alzheimer's disease Father   . Colon polyps Father   . Alzheimer's disease Mother   . Dementia Mother   . Arthritis Unknown   . Hyperlipidemia Unknown   . Hypertension Unknown   . Coronary artery disease Unknown        1st degree relative @60s   . Melanoma Unknown   . Colon cancer Maternal Grandmother 80    Past Surgical History:  Procedure Laterality Date  . KNEE ARTHROSCOPY  11/2009   left    Social History   Occupational History  . Passenger transport manager     [employer]  .  [employer]   Social History Main Topics  . Smoking status: Never Smoker  . Smokeless tobacco: Never Used  . Alcohol use Yes     Comment: occ. maybe 2 drinks per month per pt.    . Drug use: No  . Sexual activity: No

## 2017-02-24 ENCOUNTER — Other Ambulatory Visit: Payer: Self-pay | Admitting: Family Medicine

## 2017-04-15 ENCOUNTER — Ambulatory Visit (INDEPENDENT_AMBULATORY_CARE_PROVIDER_SITE_OTHER): Payer: Medicare Other | Admitting: Behavioral Health

## 2017-04-15 DIAGNOSIS — Z23 Encounter for immunization: Secondary | ICD-10-CM

## 2017-04-15 NOTE — Progress Notes (Signed)
Pre visit review using our clinic review tool, if applicable. No additional management support is needed unless otherwise documented below in the visit note.  Patient came in office today for influenza vaccination. IM injection was given in the left deltoid. Patient tolerated the injection well.

## 2017-05-08 ENCOUNTER — Encounter: Payer: Self-pay | Admitting: Family Medicine

## 2017-05-20 ENCOUNTER — Encounter: Payer: Self-pay | Admitting: Family Medicine

## 2017-05-20 ENCOUNTER — Ambulatory Visit: Payer: Medicare Other | Admitting: Family Medicine

## 2017-05-20 VITALS — BP 140/86 | HR 100 | Temp 98.5°F | Resp 16 | Ht 65.0 in | Wt 206.6 lb

## 2017-05-20 DIAGNOSIS — I10 Essential (primary) hypertension: Secondary | ICD-10-CM | POA: Diagnosis not present

## 2017-05-20 DIAGNOSIS — Z1239 Encounter for other screening for malignant neoplasm of breast: Secondary | ICD-10-CM

## 2017-05-20 DIAGNOSIS — E785 Hyperlipidemia, unspecified: Secondary | ICD-10-CM | POA: Diagnosis not present

## 2017-05-20 DIAGNOSIS — Z23 Encounter for immunization: Secondary | ICD-10-CM

## 2017-05-20 DIAGNOSIS — Z1231 Encounter for screening mammogram for malignant neoplasm of breast: Secondary | ICD-10-CM | POA: Diagnosis not present

## 2017-05-20 NOTE — Patient Instructions (Signed)
Preventive Care 65 Years and Older, Female Preventive care refers to lifestyle choices and visits with your health care provider that can promote health and wellness. What does preventive care include?  A yearly physical exam. This is also called an annual well check.  Dental exams once or twice a year.  Routine eye exams. Ask your health care provider how often you should have your eyes checked.  Personal lifestyle choices, including: ? Daily care of your teeth and gums. ? Regular physical activity. ? Eating a healthy diet. ? Avoiding tobacco and drug use. ? Limiting alcohol use. ? Practicing safe sex. ? Taking low-dose aspirin every day. ? Taking vitamin and mineral supplements as recommended by your health care provider. What happens during an annual well check? The services and screenings done by your health care provider during your annual well check will depend on your age, overall health, lifestyle risk factors, and family history of disease. Counseling Your health care provider may ask you questions about your:  Alcohol use.  Tobacco use.  Drug use.  Emotional well-being.  Home and relationship well-being.  Sexual activity.  Eating habits.  History of falls.  Memory and ability to understand (cognition).  Work and work environment.  Reproductive health.  Screening You may have the following tests or measurements:  Height, weight, and BMI.  Blood pressure.  Lipid and cholesterol levels. These may be checked every 5 years, or more frequently if you are over 50 years old.  Skin check.  Lung cancer screening. You may have this screening every year starting at age 55 if you have a 30-pack-year history of smoking and currently smoke or have quit within the past 15 years.  Fecal occult blood test (FOBT) of the stool. You may have this test every year starting at age 50.  Flexible sigmoidoscopy or colonoscopy. You may have a sigmoidoscopy every 5 years or  a colonoscopy every 10 years starting at age 50.  Hepatitis C blood test.  Hepatitis B blood test.  Sexually transmitted disease (STD) testing.  Diabetes screening. This is done by checking your blood sugar (glucose) after you have not eaten for a while (fasting). You may have this done every 1-3 years.  Bone density scan. This is done to screen for osteoporosis. You may have this done starting at age 65.  Mammogram. This may be done every 1-2 years. Talk to your health care provider about how often you should have regular mammograms.  Talk with your health care provider about your test results, treatment options, and if necessary, the need for more tests. Vaccines Your health care provider may recommend certain vaccines, such as:  Influenza vaccine. This is recommended every year.  Tetanus, diphtheria, and acellular pertussis (Tdap, Td) vaccine. You may need a Td booster every 10 years.  Varicella vaccine. You may need this if you have not been vaccinated.  Zoster vaccine. You may need this after age 60.  Measles, mumps, and rubella (MMR) vaccine. You may need at least one dose of MMR if you were born in 1957 or later. You may also need a second dose.  Pneumococcal 13-valent conjugate (PCV13) vaccine. One dose is recommended after age 65.  Pneumococcal polysaccharide (PPSV23) vaccine. One dose is recommended after age 65.  Meningococcal vaccine. You may need this if you have certain conditions.  Hepatitis A vaccine. You may need this if you have certain conditions or if you travel or work in places where you may be exposed to hepatitis   A.  Hepatitis B vaccine. You may need this if you have certain conditions or if you travel or work in places where you may be exposed to hepatitis B.  Haemophilus influenzae type b (Hib) vaccine. You may need this if you have certain conditions.  Talk to your health care provider about which screenings and vaccines you need and how often you  need them. This information is not intended to replace advice given to you by your health care provider. Make sure you discuss any questions you have with your health care provider. Document Released: 07/14/2015 Document Revised: 03/06/2016 Document Reviewed: 04/18/2015 Elsevier Interactive Patient Education  2017 [employer].

## 2017-05-20 NOTE — Assessment & Plan Note (Signed)
Tolerating statin, encouraged heart healthy diet, avoid trans fats, minimize simple carbs and saturated fats. Increase exercise as tolerated 

## 2017-05-20 NOTE — Assessment & Plan Note (Signed)
Well controlled, no changes to meds. Encouraged heart healthy diet such as the DASH diet and exercise as tolerated.  °

## 2017-05-20 NOTE — Progress Notes (Signed)
Subjective:     Heather Pierce is a 66 y.o. female and is here for a comprehensive physical exam. The patient reports no problems.  Social History   Socioeconomic History  . Marital status: Single    Spouse name: Not on file  . Number of children: Not on file  . Years of education: Not on file  . Highest education level: Not on file  Social Needs  . Financial resource strain: Not on file  . Food insecurity - worry: Not on file  . Food insecurity - inability: Not on file  . Transportation needs - medical: Not on file  . Transportation needs - non-medical: Not on file  Occupational History  . Occupation: retired    Comment: [employer]    Employer: [employer]  Tobacco Use  . Smoking status: Never Smoker  . Smokeless tobacco: Never Used  Substance and Sexual Activity  . Alcohol use: Yes    Comment: occ. maybe 2 drinks per month per pt.  . Drug use: No  . Sexual activity: No    Partners: Male    Birth control/protection: None  Other Topics Concern  . Not on file  Social History Narrative   Exercise-- walking dog   Health Maintenance  Topic Date Due  . MAMMOGRAM  04/11/2017  . PNA vac Low Risk Adult (2 of 2 - PPSV23) 05/03/2017  . TETANUS/TDAP  04/19/2019  . COLONOSCOPY  01/16/2023  . INFLUENZA VACCINE  Completed  . DEXA SCAN  Completed  . Hepatitis C Screening  Completed    The following portions of the patient's history were reviewed and updated as appropriate:  She  has a past medical history of Allergy, GERD (gastroesophageal reflux disease), Hyperlipidemia, and Hypertension. She does not have any pertinent problems on file. She  has a past surgical history that includes Knee arthroscopy (11/2009). Her family history includes Alzheimer's disease in her father and mother; Arthritis in her unknown relative; COPD in her father; Cancer in her father; Colon cancer (age of onset: 31) in her maternal grandmother; Colon polyps in her father; Coronary artery disease in her  unknown relative; Dementia in her father and mother; GER disease in her father; Hyperlipidemia in her unknown relative; Hypertension in her unknown relative; Jaundice in her father; Melanoma in her unknown relative; Osteoarthritis in her father. She  reports that  has never smoked. she has never used smokeless tobacco. She reports that she drinks alcohol. She reports that she does not use drugs. She has a current medication list which includes the following prescription(s): hydrochlorothiazide and simvastatin. Current Outpatient Medications on File Prior to Visit  Medication Sig Dispense Refill  . hydrochlorothiazide (HYDRODIURIL) 25 MG tablet Take 1 tablet (25 mg total) by mouth daily. 180 tablet 0  . simvastatin (ZOCOR) 40 MG tablet Take 1 tablet (40 mg total) by mouth daily. 180 tablet 0   No current facility-administered medications on file prior to visit.    She has No Known Allergies..  Review of Systems Review of Systems  Constitutional: Negative for activity change, appetite change and fatigue.  HENT: Negative for hearing loss, congestion, tinnitus and ear discharge.  dentist q33m Eyes: Negative for visual disturbance (see optho q1y -- vision corrected to 20/20 with glasses).  Respiratory: Negative for cough, chest tightness and shortness of breath.   Cardiovascular: Negative for chest pain, palpitations and leg swelling.  Gastrointestinal: Negative for abdominal pain, diarrhea, constipation and abdominal distention.  Genitourinary: Negative for urgency, frequency, decreased urine  volume and difficulty urinating.  Musculoskeletal: Negative for back pain, arthralgias and gait problem.  Skin: Negative for color change, pallor and rash.  Neurological: Negative for dizziness, light-headedness, numbness and headaches.  Hematological: Negative for adenopathy. Does not bruise/bleed easily.  Psychiatric/Behavioral: Negative for suicidal ideas, confusion, sleep disturbance, self-injury,  dysphoric mood, decreased concentration and agitation.       Objective:    BP 140/86   Pulse 100   Temp 98.5 F (36.9 C) (Oral)   Resp 16   Ht 5\' 5"  (1.651 m)   Wt 206 lb 9.6 oz (93.7 kg)   LMP 09/24/2005   SpO2 95%   BMI 34.38 kg/m  General appearance: alert, cooperative, appears stated age and no distress Head: Normocephalic, without obvious abnormality, atraumatic Eyes: conjunctivae/corneas clear. PERRL, EOM's intact. Fundi benign. Ears: normal TM's and external ear canals both ears Nose: Nares normal. Septum midline. Mucosa normal. No drainage or sinus tenderness. Throat: lips, mucosa, and tongue normal; teeth and gums normal Neck: no adenopathy, no carotid bruit, no JVD, supple, symmetrical, trachea midline and thyroid not enlarged, symmetric, no tenderness/mass/nodules Back: symmetric, no curvature. ROM normal. No CVA tenderness. Lungs: clear to auscultation bilaterally Breasts: positive findings: fibrocystic changes Heart: regular rate and rhythm, S1, S2 normal, no murmur, click, rub or gallop Abdomen: soft, non-tender; bowel sounds normal; no masses,  no organomegaly Pelvic: not indicated; post-menopausal, no abnormal Pap smears in past Extremities: extremities normal, atraumatic, no cyanosis or edema Pulses: 2+ and symmetric Skin: Skin color, texture, turgor normal. No rashes or lesions Lymph nodes: Cervical, supraclavicular, and axillary nodes normal. Neurologic: Alert and oriented X 3, normal strength and tone. Normal symmetric reflexes. Normal coordination and gait    Assessment:    Healthy female exam.      Plan:    ghm utd Check labs  See After Visit Summary for Counseling Recommendations     1. Hyperlipidemia LDL goal <100 Tolerating statin, encouraged heart healthy diet, avoid trans fats, minimize simple carbs and saturated fats. Increase exercise as tolerated - CBC with Differential/Platelet - Comprehensive metabolic panel - POCT Urinalysis  Dipstick (Automated) - Lipid panel - Lipid panel - Comprehensive metabolic panel  2. Essential hypertension Well controlled, no changes to meds. Encouraged heart healthy diet such as the DASH diet and exercise as tolerated.  - CBC with Differential/Platelet - Comprehensive metabolic panel - POCT Urinalysis Dipstick (Automated) - Lipid panel - CBC with Differential/Platelet - POCT Urinalysis Dipstick (Automated)  3. Breast cancer screening  - MM SCREENING BREAST TOMO BILATERAL; Future  4. Hyperlipidemia, unspecified hyperlipidemia type

## 2017-05-27 ENCOUNTER — Other Ambulatory Visit (INDEPENDENT_AMBULATORY_CARE_PROVIDER_SITE_OTHER): Payer: Medicare Other

## 2017-05-27 ENCOUNTER — Ambulatory Visit (HOSPITAL_BASED_OUTPATIENT_CLINIC_OR_DEPARTMENT_OTHER)
Admission: RE | Admit: 2017-05-27 | Discharge: 2017-05-27 | Disposition: A | Discharge status: Home / Self Care | Payer: Medicare Other | Source: Ambulatory Visit | Attending: Family Medicine | Admitting: Family Medicine

## 2017-05-27 ENCOUNTER — Telehealth: Payer: Self-pay | Admitting: Family Medicine

## 2017-05-27 DIAGNOSIS — E785 Hyperlipidemia, unspecified: Secondary | ICD-10-CM

## 2017-05-27 DIAGNOSIS — I10 Essential (primary) hypertension: Secondary | ICD-10-CM

## 2017-05-27 DIAGNOSIS — Z1239 Encounter for other screening for malignant neoplasm of breast: Secondary | ICD-10-CM

## 2017-05-27 DIAGNOSIS — Z1231 Encounter for screening mammogram for malignant neoplasm of breast: Secondary | ICD-10-CM | POA: Insufficient documentation

## 2017-05-27 LAB — CBC WITH DIFFERENTIAL/PLATELET
BASOS PCT: 0.7 % (ref 0.0–3.0)
Basophils Absolute: 0 10*3/uL (ref 0.0–0.1)
EOS ABS: 0.1 10*3/uL (ref 0.0–0.7)
EOS PCT: 1.7 % (ref 0.0–5.0)
HCT: 40.2 % (ref 36.0–46.0)
Hemoglobin: 13.7 g/dL (ref 12.0–15.0)
Lymphocytes Relative: 32.8 % (ref 12.0–46.0)
Lymphs Abs: 2 10*3/uL (ref 0.7–4.0)
MCHC: 34.1 g/dL (ref 30.0–36.0)
MCV: 87 fl (ref 78.0–100.0)
MONO ABS: 0.5 10*3/uL (ref 0.1–1.0)
Monocytes Relative: 7.5 % (ref 3.0–12.0)
NEUTROS ABS: 3.4 10*3/uL (ref 1.4–7.7)
NEUTROS PCT: 57.3 % (ref 43.0–77.0)
PLATELETS: 182 10*3/uL (ref 150.0–400.0)
RBC: 4.63 Mil/uL (ref 3.87–5.11)
RDW: 12.7 % (ref 11.5–15.5)
WBC: 6 10*3/uL (ref 4.0–10.5)

## 2017-05-27 LAB — COMPREHENSIVE METABOLIC PANEL
ALBUMIN: 4.4 g/dL (ref 3.5–5.2)
ALK PHOS: 76 U/L (ref 39–117)
ALT: 21 U/L (ref 0–35)
AST: 20 U/L (ref 0–37)
BUN: 15 mg/dL (ref 6–23)
CO2: 31 mEq/L (ref 19–32)
Calcium: 9.7 mg/dL (ref 8.4–10.5)
Chloride: 101 mEq/L (ref 96–112)
Creatinine, Ser: 0.9 mg/dL (ref 0.40–1.20)
GFR: 66.49 mL/min (ref >60.00)
Glucose, Bld: 98 mg/dL (ref 70–99)
POTASSIUM: 3.7 meq/L (ref 3.5–5.1)
SODIUM: 140 meq/L (ref 135–145)
TOTAL PROTEIN: 7 g/dL (ref 6.0–8.3)
Total Bilirubin: 0.6 mg/dL (ref 0.2–1.2)

## 2017-05-27 LAB — LIPID PANEL
CHOLESTEROL: 167 mg/dL (ref 0–200)
HDL: 46.9 mg/dL (ref >39.00)
LDL CALC: 84 mg/dL (ref 0–99)
NonHDL: 119.9
TRIGLYCERIDES: 181 mg/dL — AB (ref 0.0–149.0)
Total CHOL/HDL Ratio: 4
VLDL: 36.2 mg/dL (ref 0.0–40.0)

## 2017-05-27 IMAGING — MG 2D DIGITAL SCREENING BILATERAL MAMMOGRAM WITH CAD AND ADJUNCT TO
9 series · 9 of 25 positions shown · non-contrast
Comparison: Previous exam(s).

CLINICAL DATA: Screening.

EXAM:
2D DIGITAL SCREENING BILATERAL MAMMOGRAM WITH CAD AND ADJUNCT TOMO

[R XCCL]
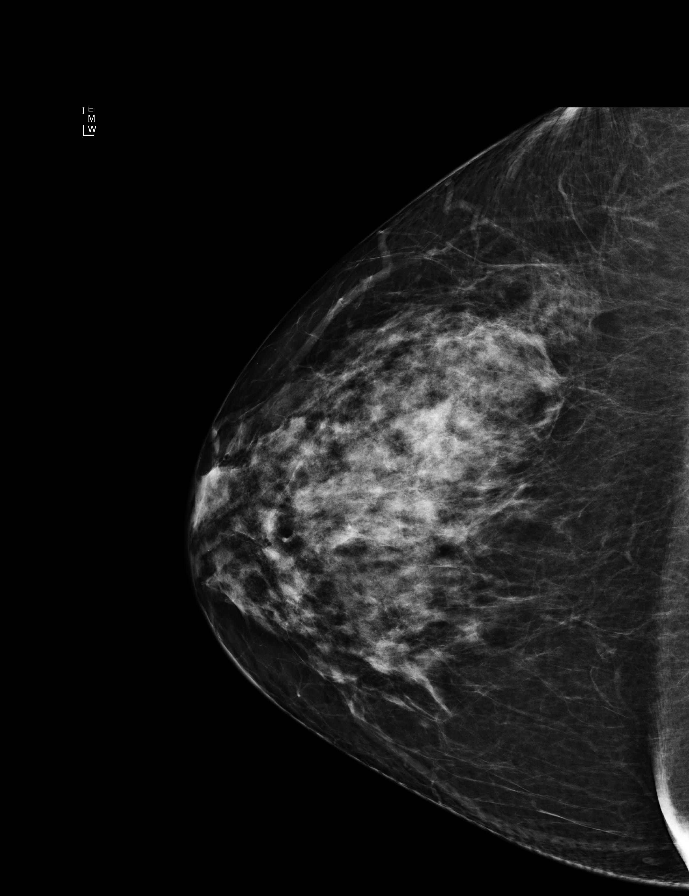

[L CC]
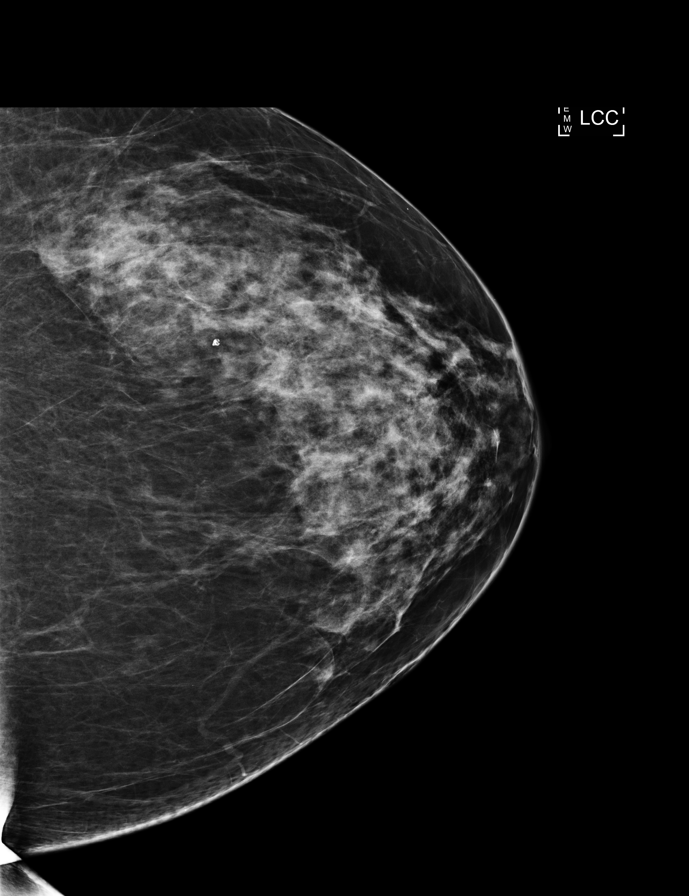

[R CC]
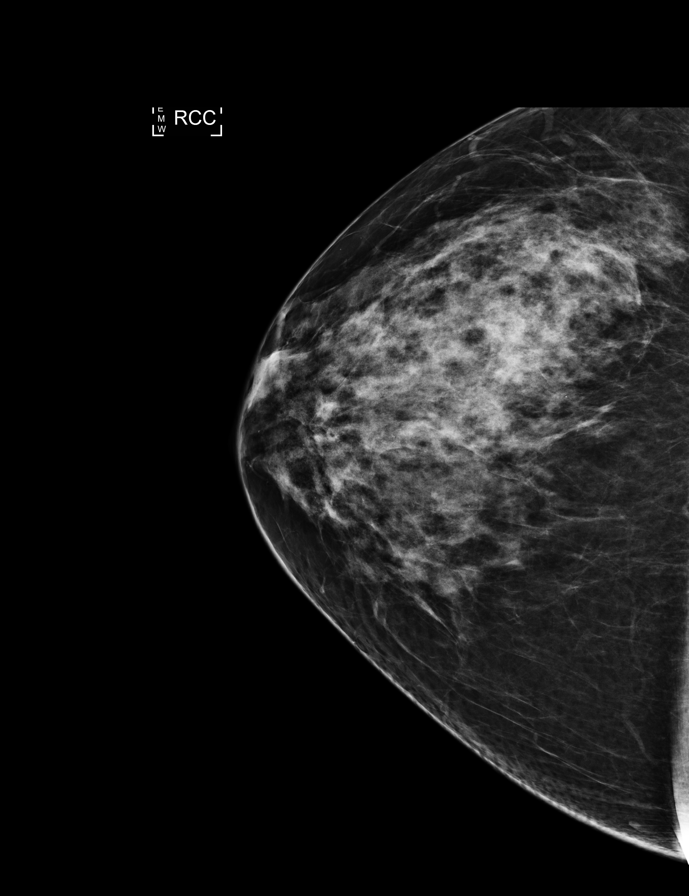

[L MLO]
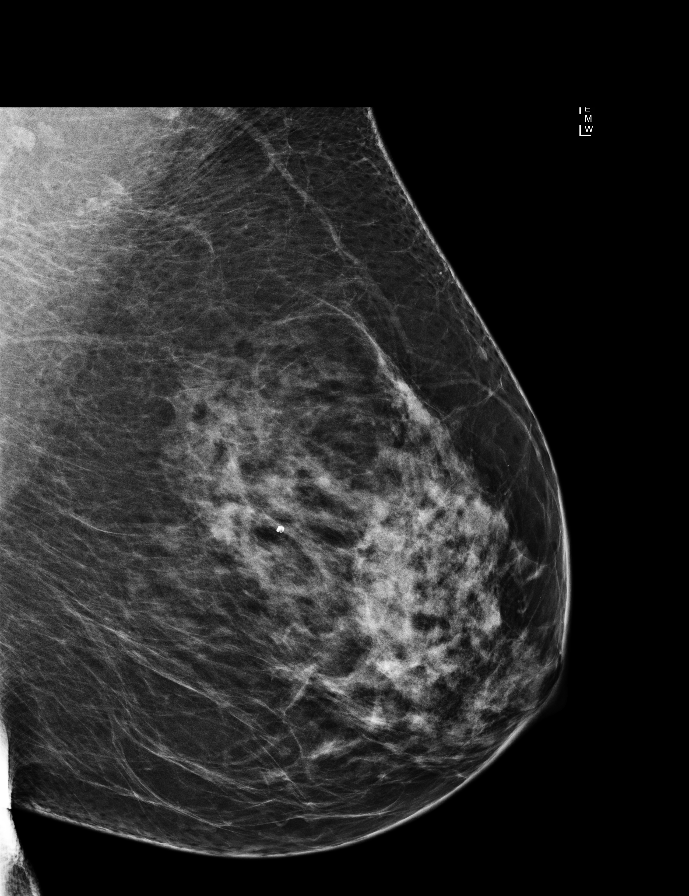

[R MLO]
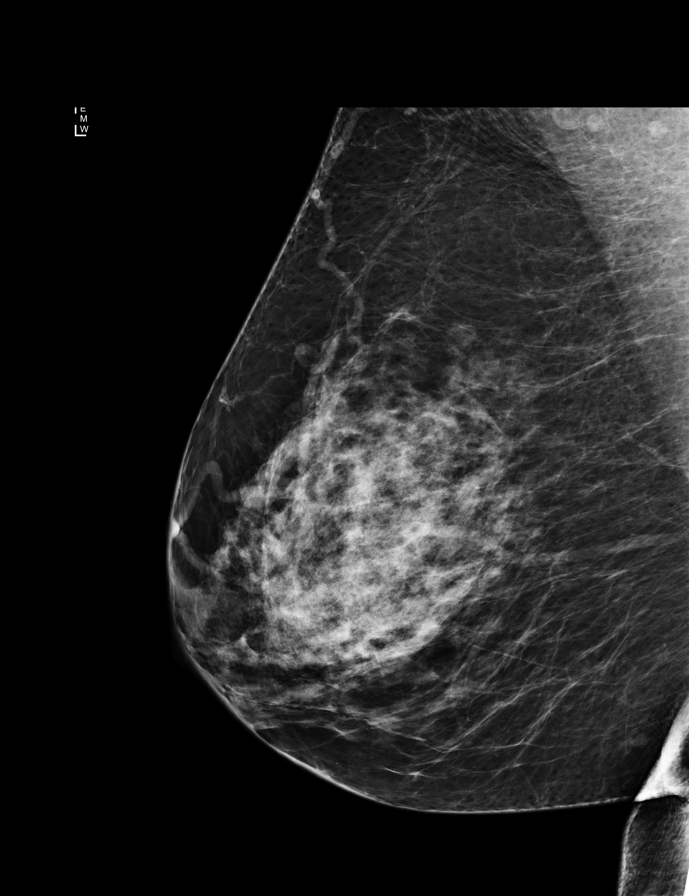

[L MLO tomo · tomo slice 44/87.0]
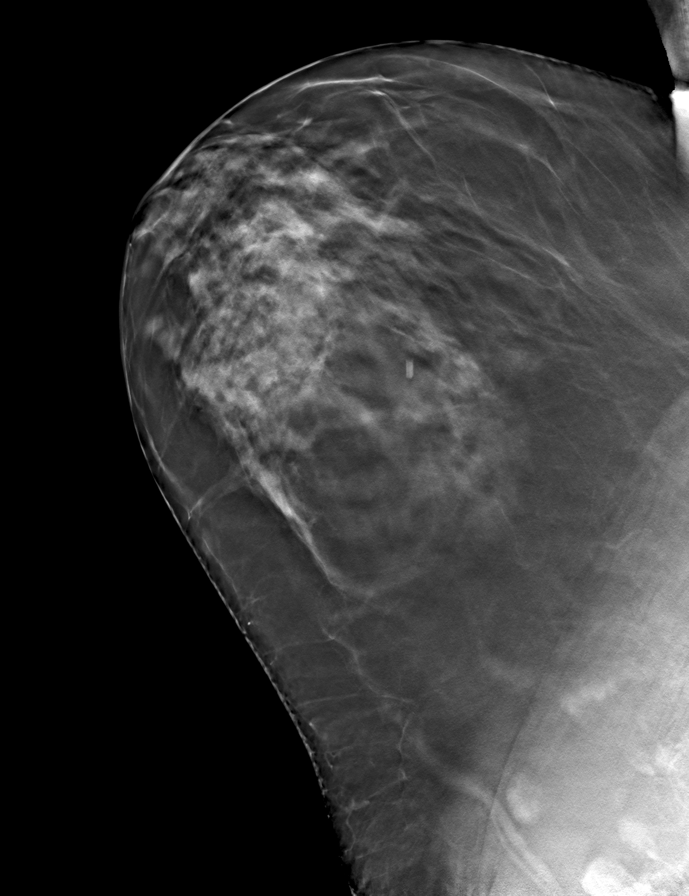

[R CC tomo · tomo slice 39/76.0]
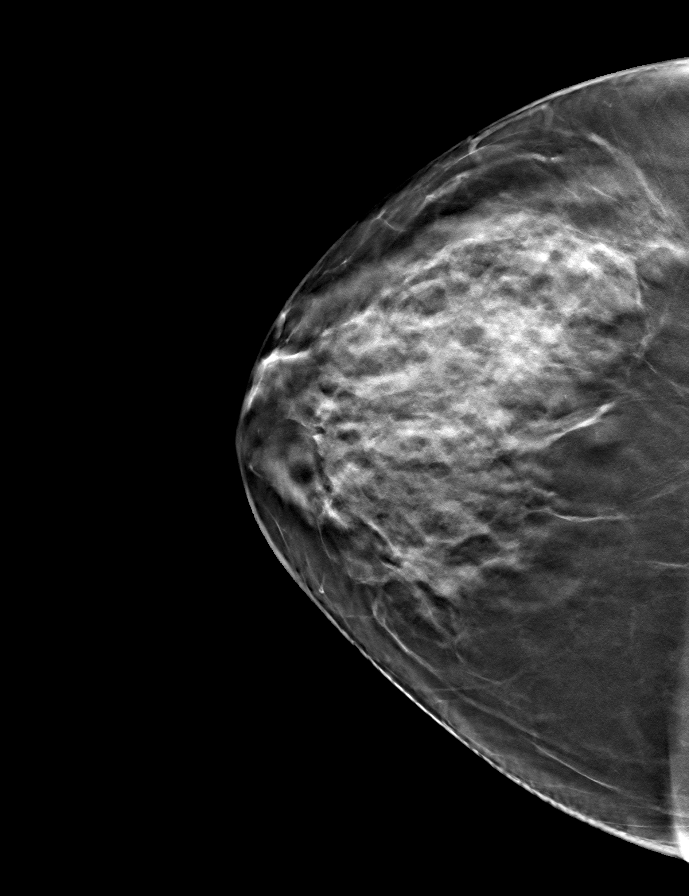

[R MLO tomo · tomo slice 42/83.0]
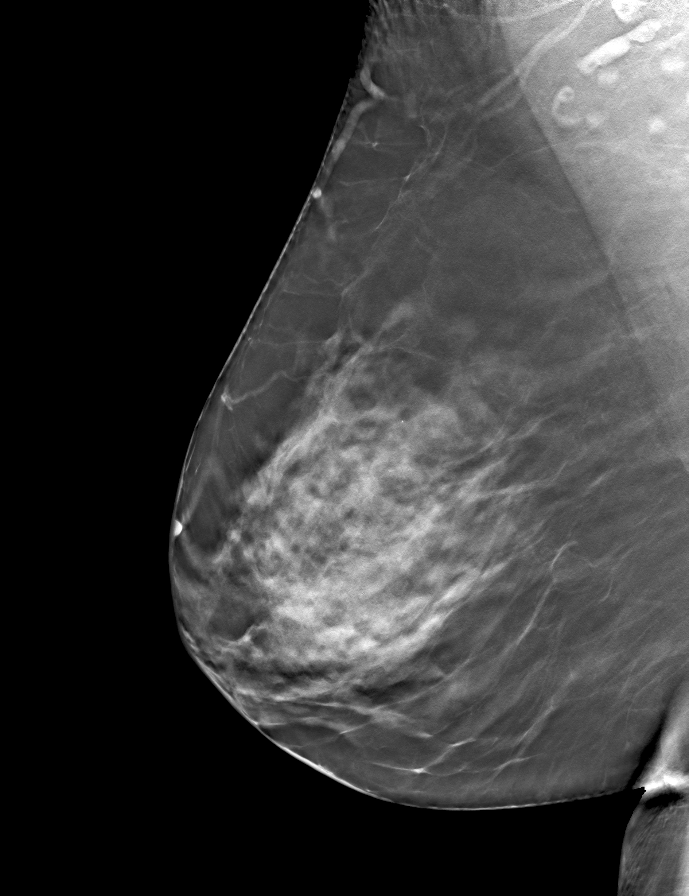

[L CC tomo · tomo slice 41/82.0]
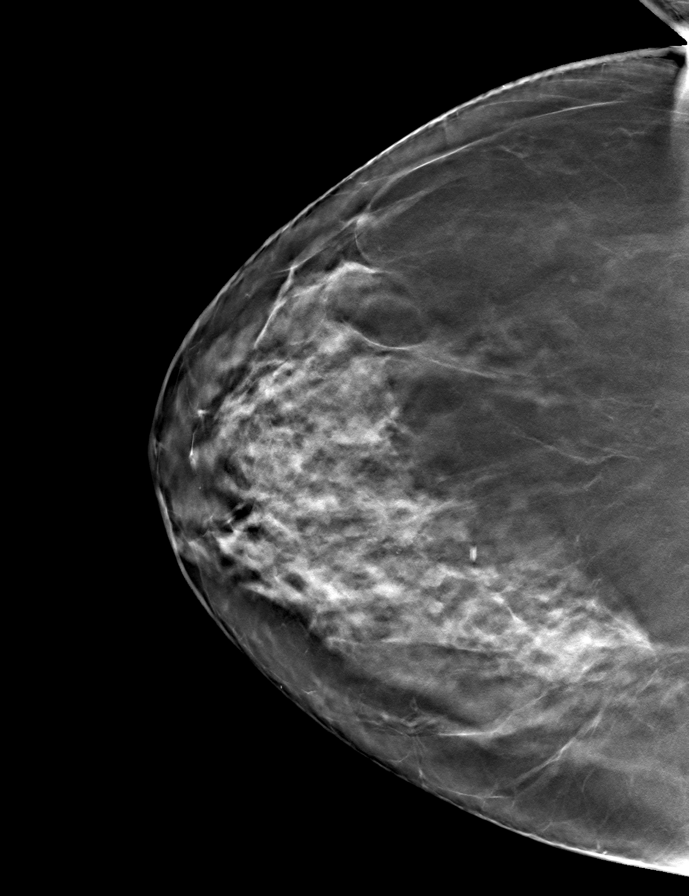

[9 of 25 positions shown; findings below may reference images not displayed]

ACR Breast Density Category c: The breast tissue is heterogeneously
dense, which may obscure small masses.
FINDINGS: There are no findings suspicious for malignancy. Images were
processed with CAD.
IMPRESSION: No mammographic evidence of malignancy. A result letter of this
screening mammogram will be mailed directly to the patient.

RECOMMENDATION:
Screening mammogram in one year. (Code:[TA])

BI-RADS CATEGORY  1: Negative.

## 2017-05-27 NOTE — Telephone Encounter (Signed)
Pt dropped off a copy of her POA documents for Dr. Etter Sjogren, documents placed in tray at front office

## 2017-05-28 ENCOUNTER — Other Ambulatory Visit: Payer: Self-pay | Admitting: *Deleted

## 2017-05-28 DIAGNOSIS — E785 Hyperlipidemia, unspecified: Secondary | ICD-10-CM

## 2017-05-28 DIAGNOSIS — I10 Essential (primary) hypertension: Secondary | ICD-10-CM

## 2017-05-28 LAB — POC URINALSYSI DIPSTICK (AUTOMATED)
BILIRUBIN UA: NEGATIVE
Glucose, UA: NEGATIVE
KETONES UA: NEGATIVE
LEUKOCYTES UA: NEGATIVE
NITRITE UA: NEGATIVE
PH UA: 6 (ref 5.0–8.0)
Protein, UA: NEGATIVE
RBC UA: NEGATIVE
SPEC GRAV UA: 1.025 (ref 1.010–1.025)
UROBILINOGEN UA: 0.2 U/dL

## 2017-05-28 NOTE — Addendum Note (Signed)
Addended by: Harl Bowie on: 05/28/2017 09:34 AM   Modules accepted: Orders

## 2017-05-28 NOTE — Telephone Encounter (Signed)
Healthcare POA Paperwork forwarded to provider/SLS 11/28

## 2017-06-26 ENCOUNTER — Encounter: Payer: Self-pay | Admitting: *Deleted

## 2017-07-15 NOTE — Progress Notes (Signed)
Dg le

## 2017-07-16 ENCOUNTER — Ambulatory Visit: Payer: Medicare Other | Admitting: Podiatry

## 2017-07-16 ENCOUNTER — Encounter: Payer: Self-pay | Admitting: Podiatry

## 2017-07-16 ENCOUNTER — Other Ambulatory Visit: Payer: Self-pay | Admitting: Podiatry

## 2017-07-16 ENCOUNTER — Ambulatory Visit (INDEPENDENT_AMBULATORY_CARE_PROVIDER_SITE_OTHER): Payer: Medicare Other

## 2017-07-16 VITALS — Resp 16

## 2017-07-16 DIAGNOSIS — M79672 Pain in left foot: Secondary | ICD-10-CM | POA: Diagnosis not present

## 2017-07-16 DIAGNOSIS — M21622 Bunionette of left foot: Secondary | ICD-10-CM

## 2017-07-16 DIAGNOSIS — M779 Enthesopathy, unspecified: Secondary | ICD-10-CM

## 2017-07-16 DIAGNOSIS — S99922A Unspecified injury of left foot, initial encounter: Secondary | ICD-10-CM

## 2017-07-16 MED ORDER — TRIAMCINOLONE ACETONIDE 10 MG/ML IJ SUSP
10.0000 mg | Freq: Once | INTRAMUSCULAR | Status: AC
Start: 2017-07-16 — End: 2017-07-16
  Administered 2017-07-16: 10 mg

## 2017-07-16 NOTE — Progress Notes (Signed)
   Subjective:    Patient ID: Heather Pierce, female    DOB: [birth year], 67 y.o.   MRN: [id number]  HPI    Review of Systems  All other systems reviewed and are negative.      Objective:   Physical Exam        Assessment & Plan:

## 2017-07-17 NOTE — Progress Notes (Signed)
Subjective:   Patient ID: Heather Pierce, female   DOB: 67 y.o.   MRN: [id number]   HPI Patient presents very concerned about swelling of the left foot and states that she did have an injury and it became swollen and she is wearing could have she broken something   ROS      Objective:  Physical Exam  Neurovascular status intact with patient found to have inflammation fluid around the fifth metatarsal head left with redness that is painful upon palpation but no other advanced pathology.  Patient does not smoke and likes to be active and has good digital perfusion     Assessment:  Probability for acute tailor's bunion deformity left with inflammatory changes and cannot rule out bone changes otherwise     Plan:  H&P x-ray reviewed today injected around the fifth MPJ 3 mg Kenalog 5 mg Xylocaine advised on wider shoes soaks and reappoint to recheck  X-ray was negative for signs of fracture or bone pathology

## 2017-09-01 ENCOUNTER — Ambulatory Visit: Payer: Self-pay | Admitting: Family Medicine

## 2017-09-01 ENCOUNTER — Other Ambulatory Visit (INDEPENDENT_AMBULATORY_CARE_PROVIDER_SITE_OTHER): Payer: Medicare Other

## 2017-09-01 DIAGNOSIS — E785 Hyperlipidemia, unspecified: Secondary | ICD-10-CM

## 2017-09-01 DIAGNOSIS — I10 Essential (primary) hypertension: Secondary | ICD-10-CM | POA: Diagnosis not present

## 2017-09-01 LAB — COMPREHENSIVE METABOLIC PANEL
ALT: 23 U/L (ref 0–35)
AST: 20 U/L (ref 0–37)
Albumin: 4.1 g/dL (ref 3.5–5.2)
Alkaline Phosphatase: 72 U/L (ref 39–117)
BILIRUBIN TOTAL: 0.6 mg/dL (ref 0.2–1.2)
BUN: 12 mg/dL (ref 6–23)
CALCIUM: 9.8 mg/dL (ref 8.4–10.5)
CHLORIDE: 102 meq/L (ref 96–112)
CO2: 31 meq/L (ref 19–32)
CREATININE: 0.91 mg/dL (ref 0.40–1.20)
GFR: 65.59 mL/min (ref >60.00)
GLUCOSE: 103 mg/dL — AB (ref 70–99)
Potassium: 3.7 mEq/L (ref 3.5–5.1)
SODIUM: 140 meq/L (ref 135–145)
Total Protein: 7.1 g/dL (ref 6.0–8.3)

## 2017-09-01 LAB — LIPID PANEL
CHOL/HDL RATIO: 2
Cholesterol: 136 mg/dL (ref 0–200)
HDL: 54.3 mg/dL (ref >39.00)
LDL Cholesterol: 54 mg/dL (ref 0–99)
NONHDL: 81.33
TRIGLYCERIDES: 138 mg/dL (ref 0.0–149.0)
VLDL: 27.6 mg/dL (ref 0.0–40.0)

## 2017-09-02 ENCOUNTER — Encounter: Payer: Self-pay | Admitting: Family Medicine

## 2017-09-04 NOTE — Telephone Encounter (Signed)
Cholesterol--- LDL goal < 100,  HDL >40,  TG < 150.  Diet and exercise will increase HDL and decrease LDL and TG.  Fish,  Fish Oil, Flaxseed oil will also help increase the HDL and decrease Triglycerides.  Exercise will also inc HDL.

## 2017-09-12 ENCOUNTER — Other Ambulatory Visit: Payer: Self-pay | Admitting: *Deleted

## 2017-09-12 MED ORDER — SIMVASTATIN 40 MG PO TABS: 40.0000 mg | ORAL_TABLET | Freq: Every day | ORAL | 0 refills | Status: DC

## 2017-09-12 MED ORDER — HYDROCHLOROTHIAZIDE 25 MG PO TABS: 25.0000 mg | ORAL_TABLET | Freq: Every day | ORAL | 0 refills | Status: DC

## 2017-11-25 ENCOUNTER — Encounter: Payer: Self-pay | Admitting: Family Medicine

## 2017-11-25 ENCOUNTER — Ambulatory Visit: Payer: Medicare Other | Admitting: Family Medicine

## 2017-11-25 VITALS — BP 136/86 | HR 82 | Temp 98.4°F | Resp 16 | Ht 65.0 in | Wt 208.8 lb

## 2017-11-25 DIAGNOSIS — I10 Essential (primary) hypertension: Secondary | ICD-10-CM

## 2017-11-25 DIAGNOSIS — E785 Hyperlipidemia, unspecified: Secondary | ICD-10-CM | POA: Diagnosis not present

## 2017-11-25 LAB — COMPREHENSIVE METABOLIC PANEL
ALBUMIN: 4.5 g/dL (ref 3.5–5.2)
ALK PHOS: 70 U/L (ref 39–117)
ALT: 21 U/L (ref 0–35)
AST: 19 U/L (ref 0–37)
BILIRUBIN TOTAL: 0.7 mg/dL (ref 0.2–1.2)
BUN: 14 mg/dL (ref 6–23)
CALCIUM: 9.7 mg/dL (ref 8.4–10.5)
CO2: 31 meq/L (ref 19–32)
CREATININE: 0.82 mg/dL (ref 0.40–1.20)
Chloride: 101 mEq/L (ref 96–112)
GFR: 73.92 mL/min (ref >60.00)
Glucose, Bld: 98 mg/dL (ref 70–99)
Potassium: 4.4 mEq/L (ref 3.5–5.1)
Sodium: 141 mEq/L (ref 135–145)
Total Protein: 7 g/dL (ref 6.0–8.3)

## 2017-11-25 LAB — LIPID PANEL
CHOL/HDL RATIO: 3
CHOLESTEROL: 171 mg/dL (ref 0–200)
HDL: 59.4 mg/dL (ref >39.00)
LDL Cholesterol: 85 mg/dL (ref 0–99)
NonHDL: 111.95
TRIGLYCERIDES: 137 mg/dL (ref 0.0–149.0)
VLDL: 27.4 mg/dL (ref 0.0–40.0)

## 2017-11-25 MED ORDER — SIMVASTATIN 40 MG PO TABS: 40.0000 mg | ORAL_TABLET | Freq: Every day | ORAL | 1 refills | Status: DC

## 2017-11-25 MED ORDER — HYDROCHLOROTHIAZIDE 25 MG PO TABS: 25.0000 mg | ORAL_TABLET | Freq: Every day | ORAL | 1 refills | Status: DC

## 2017-11-25 NOTE — Assessment & Plan Note (Signed)
Tolerating statin, encouraged heart healthy diet, avoid trans fats, minimize simple carbs and saturated fats. Increase exercise as tolerated 

## 2017-11-25 NOTE — Assessment & Plan Note (Signed)
Well controlled, no changes to meds. Encouraged heart healthy diet such as the DASH diet and exercise as tolerated.  °

## 2017-11-25 NOTE — Patient Instructions (Signed)
DASH Eating Plan DASH stands for "Dietary Approaches to Stop Hypertension." The DASH eating plan is a healthy eating plan that has been shown to reduce high blood pressure (hypertension). It may also reduce your risk for type 2 diabetes, heart disease, and stroke. The DASH eating plan may also help with weight loss. What are tips for following this plan? General guidelines  Avoid eating more than 2,300 mg (milligrams) of salt (sodium) a day. If you have hypertension, you may need to reduce your sodium intake to 1,500 mg a day.  Limit alcohol intake to no more than 1 drink a day for nonpregnant women and 2 drinks a day for men. One drink equals 12 oz of beer, 5 oz of wine, or 1 oz of hard liquor.  Work with your health care provider to maintain a healthy body weight or to lose weight. Ask what an ideal weight is for you.  Get at least 30 minutes of exercise that causes your heart to beat faster (aerobic exercise) most days of the week. Activities may include walking, swimming, or biking.  Work with your health care provider or diet and nutrition specialist (dietitian) to adjust your eating plan to your individual calorie needs. Reading food labels  Check food labels for the amount of sodium per serving. Choose foods with less than 5 percent of the Daily Value of sodium. Generally, foods with less than 300 mg of sodium per serving fit into this eating plan.  To find whole grains, look for the word "whole" as the first word in the ingredient list. Shopping  Buy products labeled as "low-sodium" or "no salt added."  Buy fresh foods. Avoid canned foods and premade or frozen meals. Cooking  Avoid adding salt when cooking. Use salt-free seasonings or herbs instead of table salt or sea salt. Check with your health care provider or pharmacist before using salt substitutes.  Do not fry foods. Cook foods using healthy methods such as baking, boiling, grilling, and broiling instead.  Cook with  heart-healthy oils, such as olive, canola, soybean, or sunflower oil. Meal planning   Eat a balanced diet that includes: ? 5 or more servings of fruits and vegetables each day. At each meal, try to fill half of your plate with fruits and vegetables. ? Up to 6-8 servings of whole grains each day. ? Less than 6 oz of lean meat, poultry, or fish each day. A 3-oz serving of meat is about the same size as a deck of cards. One egg equals 1 oz. ? 2 servings of low-fat dairy each day. ? A serving of nuts, seeds, or beans 5 times each week. ? Heart-healthy fats. Healthy fats called Omega-3 fatty acids are found in foods such as flaxseeds and coldwater fish, like sardines, salmon, and mackerel.  Limit how much you eat of the following: ? Canned or prepackaged foods. ? Food that is high in trans fat, such as fried foods. ? Food that is high in saturated fat, such as fatty meat. ? Sweets, desserts, sugary drinks, and other foods with added sugar. ? Full-fat dairy products.  Do not salt foods before eating.  Try to eat at least 2 vegetarian meals each week.  Eat more home-cooked food and less restaurant, buffet, and fast food.  When eating at a restaurant, ask that your food be prepared with less salt or no salt, if possible. What foods are recommended? The items listed may not be a complete list. Talk with your dietitian about what   dietary choices are best for you. Grains Whole-grain or whole-wheat bread. Whole-grain or whole-wheat pasta. Brown rice. Oatmeal. Quinoa. Bulgur. Whole-grain and low-sodium cereals. Pita bread. Low-fat, low-sodium crackers. Whole-wheat flour tortillas. Vegetables Fresh or frozen vegetables (raw, steamed, roasted, or grilled). Low-sodium or reduced-sodium tomato and vegetable juice. Low-sodium or reduced-sodium tomato sauce and tomato paste. Low-sodium or reduced-sodium canned vegetables. Fruits All fresh, dried, or frozen fruit. Canned fruit in natural juice (without  added sugar). Meat and other protein foods Skinless chicken or turkey. Ground chicken or turkey. Pork with fat trimmed off. Fish and seafood. Egg whites. Dried beans, peas, or lentils. Unsalted nuts, nut butters, and seeds. Unsalted canned beans. Lean cuts of beef with fat trimmed off. Low-sodium, lean deli meat. Dairy Low-fat (1%) or fat-free (skim) milk. Fat-free, low-fat, or reduced-fat cheeses. Nonfat, low-sodium ricotta or cottage cheese. Low-fat or nonfat yogurt. Low-fat, low-sodium cheese. Fats and oils Soft margarine without trans fats. Vegetable oil. Low-fat, reduced-fat, or light mayonnaise and salad dressings (reduced-sodium). Canola, safflower, olive, soybean, and sunflower oils. Avocado. Seasoning and other foods Herbs. Spices. Seasoning mixes without salt. Unsalted popcorn and pretzels. Fat-free sweets. What foods are not recommended? The items listed may not be a complete list. Talk with your dietitian about what dietary choices are best for you. Grains Baked goods made with fat, such as croissants, muffins, or some breads. Dry pasta or rice meal packs. Vegetables Creamed or fried vegetables. Vegetables in a cheese sauce. Regular canned vegetables (not low-sodium or reduced-sodium). Regular canned tomato sauce and paste (not low-sodium or reduced-sodium). Regular tomato and vegetable juice (not low-sodium or reduced-sodium). Pickles. Olives. Fruits Canned fruit in a light or heavy syrup. Fried fruit. Fruit in cream or butter sauce. Meat and other protein foods Fatty cuts of meat. Ribs. Fried meat. Bacon. Sausage. Bologna and other processed lunch meats. Salami. Fatback. Hotdogs. Bratwurst. Salted nuts and seeds. Canned beans with added salt. Canned or smoked fish. Whole eggs or egg yolks. Chicken or turkey with skin. Dairy Whole or 2% milk, cream, and half-and-half. Whole or full-fat cream cheese. Whole-fat or sweetened yogurt. Full-fat cheese. Nondairy creamers. Whipped toppings.  Processed cheese and cheese spreads. Fats and oils Butter. Stick margarine. Lard. Shortening. Ghee. Bacon fat. Tropical oils, such as coconut, palm kernel, or palm oil. Seasoning and other foods Salted popcorn and pretzels. Onion salt, garlic salt, seasoned salt, table salt, and sea salt. Worcestershire sauce. Tartar sauce. Barbecue sauce. Teriyaki sauce. Soy sauce, including reduced-sodium. Steak sauce. Canned and packaged gravies. Fish sauce. Oyster sauce. Cocktail sauce. Horseradish that you find on the shelf. Ketchup. Mustard. Meat flavorings and tenderizers. Bouillon cubes. Hot sauce and Tabasco sauce. Premade or packaged marinades. Premade or packaged taco seasonings. Relishes. Regular salad dressings. Where to find more information:  National Heart, Lung, and Blood Institute: www.nhlbi.nih.gov  American Heart Association: www.heart.org Summary  The DASH eating plan is a healthy eating plan that has been shown to reduce high blood pressure (hypertension). It may also reduce your risk for type 2 diabetes, heart disease, and stroke.  With the DASH eating plan, you should limit salt (sodium) intake to 2,300 mg a day. If you have hypertension, you may need to reduce your sodium intake to 1,500 mg a day.  When on the DASH eating plan, aim to eat more fresh fruits and vegetables, whole grains, lean proteins, low-fat dairy, and heart-healthy fats.  Work with your health care provider or diet and nutrition specialist (dietitian) to adjust your eating plan to your individual   calorie needs. This information is not intended to replace advice given to you by your health care provider. Make sure you discuss any questions you have with your health care provider. Document Released: 06/06/2011 Document Revised: 06/10/2016 Document Reviewed: 06/10/2016 Elsevier Interactive Patient Education  2018 Elsevier Inc.  

## 2017-11-25 NOTE — Progress Notes (Signed)
Patient ID: Heather Pierce, female   DOB: Nov 12, 1950, 67 y.o.   MRN: [id number]    Subjective:  I acted as a Education administrator for Dr. Carollee Herter.  Guerry Bruin, Salome   Patient ID: Heather Pierce, female    DOB: [birth year], 67 y.o.   MRN: [id number]  Chief Complaint  Patient presents with  . Hyperlipidemia  . Hypertension    HPI  Patient is in today for follow up blood pressure and cholesterol.  No complaints.     Patient Care Team: Carollee Herter, Alferd Apa, DO as PCP - General Suzan Slick, NP as Nurse Practitioner (Orthopedic Surgery)   Past Medical History:  Diagnosis Date  . Allergy   . GERD (gastroesophageal reflux disease)   . Hyperlipidemia   . Hypertension     Past Surgical History:  Procedure Laterality Date  . KNEE ARTHROSCOPY  11/2009   left     Family History  Problem Relation Age of Onset  . Jaundice Father   . GER disease Father   . COPD Father   . Dementia Father   . Osteoarthritis Father        In nursing home  . Cancer Father        melanoma  . Alzheimer's disease Father   . Colon polyps Father   . Alzheimer's disease Mother   . Dementia Mother   . Arthritis Unknown   . Hyperlipidemia Unknown   . Hypertension Unknown   . Coronary artery disease Unknown        1st degree relative @60s   . Melanoma Unknown   . Colon cancer Maternal Grandmother 64    Social History   Socioeconomic History  . Marital status: Single    Spouse name: Not on file  . Number of children: Not on file  . Years of education: Not on file  . Highest education level: Not on file  Occupational History  . Occupation: retired    Comment: [employer]    Employer: [employer]  Social Needs  . Financial resource strain: Not on file  . Food insecurity:    Worry: Not on file    Inability: Not on file  . Transportation needs:    Medical: Not on file    Non-medical: Not on file  Tobacco Use  . Smoking status: Never Smoker  . Smokeless tobacco: Never Used  Substance and Sexual  Activity  . Alcohol use: Yes    Comment: occ. maybe 2 drinks per month per pt.  . Drug use: No  . Sexual activity: Never    Partners: Male    Birth control/protection: None  Lifestyle  . Physical activity:    Days per week: Not on file    Minutes per session: Not on file  . Stress: Not on file  Relationships  . Social connections:    Talks on phone: Not on file    Gets together: Not on file    Attends religious service: Not on file    Active member of club or organization: Not on file    Attends meetings of clubs or organizations: Not on file    Relationship status: Not on file  . Intimate partner violence:    Fear of current or ex partner: Not on file    Emotionally abused: Not on file    Physically abused: Not on file    Forced sexual activity: Not on file  Other Topics Concern  . Not on file  Social History Narrative  Exercise-- walking dog    Outpatient Medications Prior to Visit  Medication Sig Dispense Refill  . Omega-3 Fatty Acids (FISH OIL) 1000 MG CPDR Take 1 capsule by mouth daily.    . hydrochlorothiazide (HYDRODIURIL) 25 MG tablet Take 1 tablet (25 mg total) by mouth daily. 180 tablet 0  . simvastatin (ZOCOR) 40 MG tablet Take 1 tablet (40 mg total) by mouth daily. 180 tablet 0  . Omega-3 Fatty Acids (OMEGA 3 PO) Take 1 tablet by mouth daily.     No facility-administered medications prior to visit.     No Known Allergies  Review of Systems  Constitutional: Negative for fever and malaise/fatigue.  HENT: Negative for congestion.   Eyes: Negative for blurred vision.  Respiratory: Negative for cough and shortness of breath.   Cardiovascular: Negative for chest pain, palpitations and leg swelling.  Gastrointestinal: Negative for vomiting.  Musculoskeletal: Negative for back pain.  Skin: Negative for rash.  Neurological: Negative for loss of consciousness and headaches.       Objective:    Physical Exam  Constitutional: She is oriented to person,  place, and time. She appears well-developed and well-nourished.  HENT:  Head: Normocephalic and atraumatic.  Eyes: Conjunctivae and EOM are normal.  Neck: Normal range of motion. Neck supple. No JVD present. Carotid bruit is not present. No thyromegaly present.  Cardiovascular: Normal rate, regular rhythm and normal heart sounds.  No murmur heard. Pulmonary/Chest: Effort normal and breath sounds normal. No respiratory distress. She has no wheezes. She has no rales. She exhibits no tenderness.  Musculoskeletal: She exhibits no edema.  Neurological: She is alert and oriented to person, place, and time.  Psychiatric: She has a normal mood and affect.  Nursing note and vitals reviewed.   BP 136/86 (BP Location: Right Arm, Cuff Size: Large)   Pulse 82   Temp 98.4 F (36.9 C) (Oral)   Resp 16   Ht 5\' 5"  (1.651 m)   Wt 208 lb 12.8 oz (94.7 kg)   LMP 09/24/2005   SpO2 96%   BMI 34.75 kg/m  Wt Readings from Last 3 Encounters:  11/25/17 208 lb 12.8 oz (94.7 kg)  05/20/17 206 lb 9.6 oz (93.7 kg)  02/07/17 209 lb (94.8 kg)   BP Readings from Last 3 Encounters:  11/25/17 136/86  05/20/17 140/86  05/03/16 (!) 162/100     Immunization History  Administered Date(s) Administered  . Influenza, High Dose Seasonal PF 04/15/2017  . Influenza,inj,Quad PF,6+ Mos 05/04/2013, 03/25/2016  . Influenza-Unspecified 06/01/2011, 03/22/2014, 03/21/2015  . Pneumococcal Conjugate-13 05/03/2016  . Pneumococcal Polysaccharide-23 05/20/2017  . Td 04/18/2009  . Zoster 11/12/2012    Health Maintenance  Topic Date Due  . INFLUENZA VACCINE  01/29/2018  . MAMMOGRAM  05/27/2018  . TETANUS/TDAP  04/19/2019  . COLONOSCOPY  01/16/2023  . DEXA SCAN  Completed  . Hepatitis C Screening  Completed  . PNA vac Low Risk Adult  Completed    Lab Results  Component Value Date   WBC 6.0 05/27/2017   HGB 13.7 05/27/2017   HCT 40.2 05/27/2017   PLT 182.0 05/27/2017   GLUCOSE 103 (H) 09/01/2017   CHOL 136  09/01/2017   TRIG 138.0 09/01/2017   HDL 54.30 09/01/2017   LDLDIRECT 148.1 04/18/2009   LDLCALC 54 09/01/2017   ALT 23 09/01/2017   AST 20 09/01/2017   NA 140 09/01/2017   K 3.7 09/01/2017   CL 102 09/01/2017   CREATININE 0.91 09/01/2017   BUN 12  09/01/2017   CO2 31 09/01/2017   TSH 1.18 04/03/2015   MICROALBUR 1.2 04/03/2015    Lab Results  Component Value Date   TSH 1.18 04/03/2015   Lab Results  Component Value Date   WBC 6.0 05/27/2017   HGB 13.7 05/27/2017   HCT 40.2 05/27/2017   MCV 87.0 05/27/2017   PLT 182.0 05/27/2017   Lab Results  Component Value Date   NA 140 09/01/2017   K 3.7 09/01/2017   CO2 31 09/01/2017   GLUCOSE 103 (H) 09/01/2017   BUN 12 09/01/2017   CREATININE 0.91 09/01/2017   BILITOT 0.6 09/01/2017   ALKPHOS 72 09/01/2017   AST 20 09/01/2017   ALT 23 09/01/2017   PROT 7.1 09/01/2017   ALBUMIN 4.1 09/01/2017   CALCIUM 9.8 09/01/2017   GFR 65.59 09/01/2017   Lab Results  Component Value Date   CHOL 136 09/01/2017   Lab Results  Component Value Date   HDL 54.30 09/01/2017   Lab Results  Component Value Date   LDLCALC 54 09/01/2017   Lab Results  Component Value Date   TRIG 138.0 09/01/2017   Lab Results  Component Value Date   CHOLHDL 2 09/01/2017   No results found for: HGBA1C       Assessment & Plan:   Problem List Items Addressed This Visit      Unprioritized   Essential hypertension    Well controlled, no changes to meds. Encouraged heart healthy diet such as the DASH diet and exercise as tolerated.       Relevant Medications   hydrochlorothiazide (HYDRODIURIL) 25 MG tablet   simvastatin (ZOCOR) 40 MG tablet   Hyperlipidemia    Tolerating statin, encouraged heart healthy diet, avoid trans fats, minimize simple carbs and saturated fats. Increase exercise as tolerated      Relevant Medications   hydrochlorothiazide (HYDRODIURIL) 25 MG tablet   simvastatin (ZOCOR) 40 MG tablet    Other Visit Diagnoses     Hyperlipidemia LDL goal <100    -  Primary   Relevant Medications   hydrochlorothiazide (HYDRODIURIL) 25 MG tablet   simvastatin (ZOCOR) 40 MG tablet   Other Relevant Orders   Comprehensive metabolic panel   Lipid panel      I have discontinued Sheccid Popovich's Omega-3 Fatty Acids (OMEGA 3 PO). I am also having her maintain her Fish Oil, hydrochlorothiazide, and simvastatin.  Meds ordered this encounter  Medications  . hydrochlorothiazide (HYDRODIURIL) 25 MG tablet    Sig: Take 1 tablet (25 mg total) by mouth daily.    Dispense:  180 tablet    Refill:  1  . simvastatin (ZOCOR) 40 MG tablet    Sig: Take 1 tablet (40 mg total) by mouth daily.    Dispense:  180 tablet    Refill:  1    CMA served as scribe during this visit. History, Physical and Plan performed by medical provider. Documentation and orders reviewed and attested to.  Ann Held, DO

## 2018-05-07 NOTE — Progress Notes (Signed)
Subjective:   Heather Pierce is a 67 y.o. female who presents for Medicare Annual (Subsequent) preventive examination.  Review of Systems: No ROS.  Medicare Wellness Visit. Additional risk factors are reflected in the social history. Cardiac Risk Factors include: advanced age (>78men, >34 women);dyslipidemia;hypertension;obesity (BMI >30kg/m2) Sleep patterns: no issues Home Safety/Smoke Alarms: Feels safe in home. Smoke alarms in place. Lives alone in 2 story home. Does well with stairs.    Female:       Mammo- utd       Dexa scan- utd       CCS- 01/15/13-normal. Recall 10 yrs Eye- utd with yearly exam per pt. Can't recall doctor's name. [facility] in [place].    Objective:     Vitals: BP 140/84 (BP Location: Left Arm, Patient Position: Sitting, Cuff Size: Normal)   Pulse 74   Ht 5\' 5"  (1.651 m)   Wt 205 lb 6.4 oz (93.2 kg)   LMP 09/24/2005   SpO2 97%   BMI 34.18 kg/m   Body mass index is 34.18 kg/m.  Advanced Directives 05/08/2018 06/26/2017 05/03/2016  Does Patient Have a Medical Advance Directive? Yes Yes No  Type of Paramedic of [place];Living will [place] -  Does patient want to make changes to medical advance directive? No - Patient declined No - Patient declined -  Copy of [place] in Chart? Yes - validated most recent copy scanned in chart (See row information) - -  Would patient like information on creating a medical advance directive? - - No - patient declined information;Yes - Scientist, clinical (histocompatibility and immunogenetics) given    Tobacco Social History   Tobacco Use  Smoking Status Never Smoker  Smokeless Tobacco Never Used     Counseling given: Not Answered   Clinical Intake: Pain : No/denies pain  Past Medical History:  Diagnosis Date  . Allergy   . GERD (gastroesophageal reflux disease)   . Hyperlipidemia   . Hypertension    Past Surgical History:  Procedure Laterality Date  . KNEE  ARTHROSCOPY  11/2009   left    Family History  Problem Relation Age of Onset  . Jaundice Father   . GER disease Father   . COPD Father   . Dementia Father   . Osteoarthritis Father        In nursing home  . Cancer Father        melanoma  . Alzheimer's disease Father   . Colon polyps Father   . Alzheimer's disease Mother   . Dementia Mother   . Arthritis Unknown   . Hyperlipidemia Unknown   . Hypertension Unknown   . Coronary artery disease Unknown        1st degree relative @60s   . Melanoma Unknown   . Colon cancer Maternal Grandmother 71   Social History   Socioeconomic History  . Marital status: Single    Spouse name: Not on file  . Number of children: Not on file  . Years of education: Not on file  . Highest education level: Not on file  Occupational History  . Occupation: retired    Comment: [employer]    Employer: [employer]  Social Needs  . Financial resource strain: Not on file  . Food insecurity:    Worry: Not on file    Inability: Not on file  . Transportation needs:    Medical: Not on file    Non-medical: Not on file  Tobacco  Use  . Smoking status: Never Smoker  . Smokeless tobacco: Never Used  Substance and Sexual Activity  . Alcohol use: Yes    Comment: occ. maybe 2 drinks per month per pt.  . Drug use: No  . Sexual activity: Never    Partners: Male    Birth control/protection: None  Lifestyle  . Physical activity:    Days per week: Not on file    Minutes per session: Not on file  . Stress: Not on file  Relationships  . Social connections:    Talks on phone: Not on file    Gets together: Not on file    Attends religious service: Not on file    Active member of club or organization: Not on file    Attends meetings of clubs or organizations: Not on file    Relationship status: Not on file  Other Topics Concern  . Not on file  Social History Narrative   Exercise-- walking dog    Outpatient Encounter Medications as of 05/08/2018    Medication Sig  . hydrochlorothiazide (HYDRODIURIL) 25 MG tablet Take 1 tablet (25 mg total) by mouth daily.  . Omega-3 Fatty Acids (FISH OIL) 1000 MG CPDR Take 1 capsule by mouth daily.  . simvastatin (ZOCOR) 40 MG tablet Take 1 tablet (40 mg total) by mouth daily.   No facility-administered encounter medications on file as of 05/08/2018.     Activities of Daily Living In your present state of health, do you have any difficulty performing the following activities: 05/08/2018  Hearing? N  Vision? N  Difficulty concentrating or making decisions? N  Walking or climbing stairs? N  Dressing or bathing? N  Doing errands, shopping? N  Preparing Food and eating ? N  Using the Toilet? N  In the past six months, have you accidently leaked urine? N  Do you have problems with loss of bowel control? N  Managing your Medications? N  Managing your Finances? N  Housekeeping or managing your Housekeeping? N  Some recent data might be hidden    Patient Care Team: Carollee Herter, Alferd Apa, DO as PCP - [place], [place], NP as Nurse Practitioner (Orthopedic Surgery)    Assessment:   This is a routine wellness examination for [place]. Physical assessment deferred to PCP.  Exercise Activities and Dietary recommendations Current Exercise Habits: Home exercise routine, Type of exercise: walking, Time (Minutes): 30, Frequency (Times/Week): 7, Weekly Exercise (Minutes/Week): 210, Intensity: Mild, Exercise limited by: None identified   Diet (meal preparation, eat out, water intake, caffeinated beverages, dairy products, fruits and vegetables): in general, an "unhealthy" diet, on average, 3 meals per day     Goals    . DIET - EAT MORE FRUITS AND VEGETABLES    . DIET - INCREASE WATER INTAKE    . DIET - REDUCE FAST FOOD INTAKE       Fall Risk Fall Risk  05/08/2018 05/03/2016  Falls in the past year? 0 Yes    Depression Screen PHQ 2/9 Scores 05/08/2018 05/03/2016 11/12/2012  PHQ - 2 Score 0 0 0      Cognitive Function Ad8 score reviewed for issues:  Issues making decisions:no  Less interest in hobbies / activities:no  Repeats questions, stories (family complaining):no  Trouble using ordinary gadgets (microwave, computer, phone):no  Forgets the month or year: no  Mismanaging finances: no  Remembering appts:no  Daily problems with thinking and/or memory:no Ad8 score is=0    MMSE - Mini Mental State  Exam 05/03/2016  Orientation to time 5  Orientation to Place 5  Registration 3  Attention/ Calculation 5  Recall 3  Language- name 2 objects 2  Language- repeat 1  Language- follow 3 step command 3  Language- read & follow direction 1  Write a sentence 1  Copy design 1  Total score 30        Immunization History  Administered Date(s) Administered  . Influenza, High Dose Seasonal PF 04/15/2017  . Influenza,inj,Quad PF,6+ Mos 05/04/2013, 03/25/2016  . Influenza-Unspecified 06/01/2011, 03/22/2014, 03/21/2015  . Pneumococcal Conjugate-13 05/03/2016  . Pneumococcal Polysaccharide-23 05/20/2017  . Td 04/18/2009  . Zoster 11/12/2012   Screening Tests Health Maintenance  Topic Date Due  . INFLUENZA VACCINE  01/29/2018  . MAMMOGRAM  05/27/2018  . TETANUS/TDAP  04/19/2019  . COLONOSCOPY  01/16/2023  . DEXA SCAN  Completed  . Hepatitis C Screening  Completed  . PNA vac Low Risk Adult  Completed       Plan:    Please schedule your next medicare wellness visit with me in 1 yr.  Continue to eat heart healthy diet (full of fruits, vegetables, whole grains, lean protein, water--limit salt, fat, and sugar intake)   You received your flu shot today.    I have personally reviewed and noted the following in the patient's chart:   . Medical and social history . Use of alcohol, tobacco or illicit drugs  . Current medications and supplements . Functional ability and status . Nutritional status . Physical activity . Advanced directives . List of other  physicians . Hospitalizations, surgeries, and ER visits in previous 12 months . Vitals . Screenings to include cognitive, depression, and falls . Referrals and appointments  In addition, I have reviewed and discussed with patient certain preventive protocols, quality metrics, and best practice recommendations. A written personalized care plan for preventive services as well as general preventive health recommendations were provided to patient.     Shela Nevin, [place]  05/08/2018

## 2018-05-08 ENCOUNTER — Other Ambulatory Visit: Payer: Self-pay | Admitting: Family Medicine

## 2018-05-08 ENCOUNTER — Ambulatory Visit (INDEPENDENT_AMBULATORY_CARE_PROVIDER_SITE_OTHER): Payer: Medicare Other | Admitting: *Deleted

## 2018-05-08 ENCOUNTER — Encounter: Payer: Self-pay | Admitting: *Deleted

## 2018-05-08 ENCOUNTER — Ambulatory Visit: Payer: Medicare Other

## 2018-05-08 VITALS — BP 140/84 | HR 74 | Ht 65.0 in | Wt 205.4 lb

## 2018-05-08 DIAGNOSIS — Z23 Encounter for immunization: Secondary | ICD-10-CM | POA: Diagnosis not present

## 2018-05-08 DIAGNOSIS — Z0000 Encounter for general adult medical examination without abnormal findings: Secondary | ICD-10-CM

## 2018-05-08 DIAGNOSIS — Z1231 Encounter for screening mammogram for malignant neoplasm of breast: Secondary | ICD-10-CM

## 2018-05-08 NOTE — Progress Notes (Signed)
Reviewed  Yvonne R Lowne Chase, DO  

## 2018-05-08 NOTE — Patient Instructions (Signed)
Please schedule your next medicare wellness visit with me in 1 yr.  Continue to eat heart healthy diet (full of fruits, vegetables, whole grains, lean protein, water--limit salt, fat, and sugar intake)   You received your flu shot today.    Heather Pierce , Thank you for taking time to come for your Medicare Wellness Visit. I appreciate your ongoing commitment to your health goals. Please review the following plan we discussed and let me know if I can assist you in the future.   These are the goals we discussed: Goals    . DIET - EAT MORE FRUITS AND VEGETABLES    . DIET - INCREASE WATER INTAKE    . DIET - REDUCE FAST FOOD INTAKE       This is a list of the screening recommended for you and due dates:  Health Maintenance  Topic Date Due  . Flu Shot  01/29/2018  . Mammogram  05/27/2018  . Tetanus Vaccine  04/19/2019  . Colon Cancer Screening  01/16/2023  . DEXA scan (bone density measurement)  Completed  .  Hepatitis C: One time screening is recommended by Center for Disease Control  (CDC) for  adults born from [birth year] through 1965.   Completed  . Pneumonia vaccines  Completed    Health Maintenance for Postmenopausal Women Menopause is a normal process in which your reproductive ability comes to an end. This process happens gradually over a span of months to years, usually between the ages of 37 and 53. Menopause is complete when you have missed 12 consecutive menstrual periods. It is important to talk with your health care provider about some of the most common conditions that affect postmenopausal women, such as heart disease, cancer, and bone loss (osteoporosis). Adopting a healthy lifestyle and getting preventive care can help to promote your health and wellness. Those actions can also lower your chances of developing some of these common conditions. What should I know about menopause? During menopause, you may experience a number of symptoms, such as:  Moderate-to-severe hot  flashes.  Night sweats.  Decrease in sex drive.  Mood swings.  Headaches.  Tiredness.  Irritability.  Memory problems.  Insomnia.  Choosing to treat or not to treat menopausal changes is an individual decision that you make with your health care provider. What should I know about hormone replacement therapy and supplements? Hormone therapy products are effective for treating symptoms that are associated with menopause, such as hot flashes and night sweats. Hormone replacement carries certain risks, especially as you become older. If you are thinking about using estrogen or estrogen with progestin treatments, discuss the benefits and risks with your health care provider. What should I know about heart disease and stroke? Heart disease, heart attack, and stroke become more likely as you age. This may be due, in part, to the hormonal changes that your body experiences during menopause. These can affect how your body processes dietary fats, triglycerides, and cholesterol. Heart attack and stroke are both medical emergencies. There are many things that you can do to help prevent heart disease and stroke:  Have your blood pressure checked at least every 1-2 years. High blood pressure causes heart disease and increases the risk of stroke.  If you are 64-80 years old, ask your health care provider if you should take aspirin to prevent a heart attack or a stroke.  Do not use any tobacco products, including cigarettes, chewing tobacco, or electronic cigarettes. If you need help quitting, ask your  health care provider.  It is important to eat a healthy diet and maintain a healthy weight. ? Be sure to include plenty of vegetables, fruits, low-fat dairy products, and lean protein. ? Avoid eating foods that are high in solid fats, added sugars, or salt (sodium).  Get regular exercise. This is one of the most important things that you can do for your health. ? Try to exercise for at least 150  minutes each week. The type of exercise that you do should increase your heart rate and make you sweat. This is known as moderate-intensity exercise. ? Try to do strengthening exercises at least twice each week. Do these in addition to the moderate-intensity exercise.  Know your numbers.Ask your health care provider to check your cholesterol and your blood glucose. Continue to have your blood tested as directed by your health care provider.  What should I know about cancer screening? There are several types of cancer. Take the following steps to reduce your risk and to catch any cancer development as early as possible. Breast Cancer  Practice breast self-awareness. ? This means understanding how your breasts normally appear and feel. ? It also means doing regular breast self-exams. Let your health care provider know about any changes, no matter how small.  If you are 65 or older, have a clinician do a breast exam (clinical breast exam or CBE) every year. Depending on your age, family history, and medical history, it may be recommended that you also have a yearly breast X-ray (mammogram).  If you have a family history of breast cancer, talk with your health care provider about genetic screening.  If you are at high risk for breast cancer, talk with your health care provider about having an MRI and a mammogram every year.  Breast cancer (BRCA) gene test is recommended for women who have family members with BRCA-related cancers. Results of the assessment will determine the need for genetic counseling and BRCA1 and for BRCA2 testing. BRCA-related cancers include these types: ? Breast. This occurs in males or females. ? Ovarian. ? Tubal. This may also be called fallopian tube cancer. ? Cancer of the abdominal or pelvic lining (peritoneal cancer). ? Prostate. ? Pancreatic.  Cervical, Uterine, and Ovarian Cancer Your health care provider may recommend that you be screened regularly for cancer  of the pelvic organs. These include your ovaries, uterus, and vagina. This screening involves a pelvic exam, which includes checking for microscopic changes to the surface of your cervix (Pap test).  For women ages 21-65, health care providers may recommend a pelvic exam and a Pap test every three years. For women ages 41-65, they may recommend the Pap test and pelvic exam, combined with testing for human papilloma virus (HPV), every five years. Some types of HPV increase your risk of cervical cancer. Testing for HPV may also be done on women of any age who have unclear Pap test results.  Other health care providers may not recommend any screening for nonpregnant women who are considered low risk for pelvic cancer and have no symptoms. Ask your health care provider if a screening pelvic exam is right for you.  If you have had past treatment for cervical cancer or a condition that could lead to cancer, you need Pap tests and screening for cancer for at least 20 years after your treatment. If Pap tests have been discontinued for you, your risk factors (such as having a new sexual partner) need to be reassessed to determine if you  should start having screenings again. Some women have medical problems that increase the chance of getting cervical cancer. In these cases, your health care provider may recommend that you have screening and Pap tests more often.  If you have a family history of uterine cancer or ovarian cancer, talk with your health care provider about genetic screening.  If you have vaginal bleeding after reaching menopause, tell your health care provider.  There are currently no reliable tests available to screen for ovarian cancer.  Lung Cancer Lung cancer screening is recommended for adults 108-16 years old who are at high risk for lung cancer because of a history of smoking. A yearly low-dose CT scan of the lungs is recommended if you:  Currently smoke.  Have a history of at least 30  pack-years of smoking and you currently smoke or have quit within the past 15 years. A pack-year is smoking an average of one pack of cigarettes per day for one year.  Yearly screening should:  Continue until it has been 15 years since you quit.  Stop if you develop a health problem that would prevent you from having lung cancer treatment.  Colorectal Cancer  This type of cancer can be detected and can often be prevented.  Routine colorectal cancer screening usually begins at age 6 and continues through age 42.  If you have risk factors for colon cancer, your health care provider may recommend that you be screened at an earlier age.  If you have a family history of colorectal cancer, talk with your health care provider about genetic screening.  Your health care provider may also recommend using home test kits to check for hidden blood in your stool.  A small camera at the end of a tube can be used to examine your colon directly (sigmoidoscopy or colonoscopy). This is done to check for the earliest forms of colorectal cancer.  Direct examination of the colon should be repeated every 5-10 years until age 76. However, if early forms of precancerous polyps or small growths are found or if you have a family history or genetic risk for colorectal cancer, you may need to be screened more often.  Skin Cancer  Check your skin from head to toe regularly.  Monitor any moles. Be sure to tell your health care provider: ? About any new moles or changes in moles, especially if there is a change in a mole's shape or color. ? If you have a mole that is larger than the size of a pencil eraser.  If any of your family members has a history of skin cancer, especially at a young age, talk with your health care provider about genetic screening.  Always use sunscreen. Apply sunscreen liberally and repeatedly throughout the day.  Whenever you are outside, protect yourself by wearing long sleeves, pants,  a wide-brimmed hat, and sunglasses.  What should I know about osteoporosis? Osteoporosis is a condition in which bone destruction happens more quickly than new bone creation. After menopause, you may be at an increased risk for osteoporosis. To help prevent osteoporosis or the bone fractures that can happen because of osteoporosis, the following is recommended:  If you are 108-60 years old, get at least 1,000 mg of calcium and at least 600 mg of vitamin D per day.  If you are older than age 48 but younger than age 35, get at least 1,200 mg of calcium and at least 600 mg of vitamin D per day.  If you are  older than age 50, get at least 1,200 mg of calcium and at least 800 mg of vitamin D per day.  Smoking and excessive alcohol intake increase the risk of osteoporosis. Eat foods that are rich in calcium and vitamin D, and do weight-bearing exercises several times each week as directed by your health care provider. What should I know about how menopause affects my mental health? Depression may occur at any age, but it is more common as you become older. Common symptoms of depression include:  Low or sad mood.  Changes in sleep patterns.  Changes in appetite or eating patterns.  Feeling an overall lack of motivation or enjoyment of activities that you previously enjoyed.  Frequent crying spells.  Talk with your health care provider if you think that you are experiencing depression. What should I know about immunizations? It is important that you get and maintain your immunizations. These include:  Tetanus, diphtheria, and pertussis (Tdap) booster vaccine.  Influenza every year before the flu season begins.  Pneumonia vaccine.  Shingles vaccine.  Your health care provider may also recommend other immunizations. This information is not intended to replace advice given to you by your health care provider. Make sure you discuss any questions you have with your health care  provider. Document Released: 08/09/2005 Document Revised: 01/05/2016 Document Reviewed: 03/21/2015 Elsevier Interactive Patient Education  2018 [employer].

## 2018-06-08 ENCOUNTER — Ambulatory Visit (HOSPITAL_BASED_OUTPATIENT_CLINIC_OR_DEPARTMENT_OTHER)
Admission: RE | Admit: 2018-06-08 | Discharge: 2018-06-08 | Disposition: A | Discharge status: Home / Self Care | Payer: Medicare Other | Source: Ambulatory Visit | Attending: Family Medicine | Admitting: Family Medicine

## 2018-06-08 ENCOUNTER — Ambulatory Visit (INDEPENDENT_AMBULATORY_CARE_PROVIDER_SITE_OTHER): Payer: Medicare Other | Admitting: Family Medicine

## 2018-06-08 ENCOUNTER — Encounter: Payer: Self-pay | Admitting: Family Medicine

## 2018-06-08 VITALS — BP 142/88 | HR 78 | Temp 97.6°F | Resp 16 | Ht 65.0 in | Wt 203.8 lb

## 2018-06-08 DIAGNOSIS — Z0000 Encounter for general adult medical examination without abnormal findings: Secondary | ICD-10-CM

## 2018-06-08 DIAGNOSIS — E785 Hyperlipidemia, unspecified: Secondary | ICD-10-CM

## 2018-06-08 DIAGNOSIS — Z1231 Encounter for screening mammogram for malignant neoplasm of breast: Secondary | ICD-10-CM

## 2018-06-08 DIAGNOSIS — E2839 Other primary ovarian failure: Secondary | ICD-10-CM

## 2018-06-08 DIAGNOSIS — M1711 Unilateral primary osteoarthritis, right knee: Secondary | ICD-10-CM

## 2018-06-08 DIAGNOSIS — I10 Essential (primary) hypertension: Secondary | ICD-10-CM

## 2018-06-08 LAB — COMPREHENSIVE METABOLIC PANEL
ALT: 21 U/L (ref 0–35)
AST: 19 U/L (ref 0–37)
Albumin: 4.5 g/dL (ref 3.5–5.2)
Alkaline Phosphatase: 82 U/L (ref 39–117)
BUN: 18 mg/dL (ref 6–23)
CHLORIDE: 101 meq/L (ref 96–112)
CO2: 30 meq/L (ref 19–32)
CREATININE: 0.94 mg/dL (ref 0.40–1.20)
Calcium: 9.6 mg/dL (ref 8.4–10.5)
GFR: 63.04 mL/min (ref >60.00)
Glucose, Bld: 100 mg/dL — ABNORMAL HIGH (ref 70–99)
Potassium: 3.6 mEq/L (ref 3.5–5.1)
SODIUM: 140 meq/L (ref 135–145)
Total Bilirubin: 0.7 mg/dL (ref 0.2–1.2)
Total Protein: 6.8 g/dL (ref 6.0–8.3)

## 2018-06-08 LAB — CBC WITH DIFFERENTIAL/PLATELET
BASOS PCT: 0.3 % (ref 0.0–3.0)
Basophils Absolute: 0 10*3/uL (ref 0.0–0.1)
EOS ABS: 0.1 10*3/uL (ref 0.0–0.7)
Eosinophils Relative: 1.6 % (ref 0.0–5.0)
HCT: 40.8 % (ref 36.0–46.0)
HEMOGLOBIN: 14 g/dL (ref 12.0–15.0)
Lymphocytes Relative: 29.4 % (ref 12.0–46.0)
Lymphs Abs: 1.9 10*3/uL (ref 0.7–4.0)
MCHC: 34.3 g/dL (ref 30.0–36.0)
MCV: 85.2 fl (ref 78.0–100.0)
MONO ABS: 0.5 10*3/uL (ref 0.1–1.0)
Monocytes Relative: 7.3 % (ref 3.0–12.0)
NEUTROS ABS: 3.9 10*3/uL (ref 1.4–7.7)
Neutrophils Relative %: 61.4 % (ref 43.0–77.0)
Platelets: 185 10*3/uL (ref 150.0–400.0)
RBC: 4.79 Mil/uL (ref 3.87–5.11)
RDW: 12.8 % (ref 11.5–15.5)
WBC: 6.4 10*3/uL (ref 4.0–10.5)

## 2018-06-08 LAB — LIPID PANEL
Cholesterol: 152 mg/dL (ref 0–200)
HDL: 57.3 mg/dL (ref >39.00)
LDL CALC: 71 mg/dL (ref 0–99)
NonHDL: 94.88
Total CHOL/HDL Ratio: 3
Triglycerides: 119 mg/dL (ref 0.0–149.0)
VLDL: 23.8 mg/dL (ref 0.0–40.0)

## 2018-06-08 IMAGING — MG DIGITAL SCREENING BILATERAL MAMMOGRAM WITH TOMO AND CAD
8 series · 8 of 24 positions shown · non-contrast
Comparison: Previous exam(s).

CLINICAL DATA: Screening.

EXAM:
DIGITAL SCREENING BILATERAL MAMMOGRAM WITH TOMO AND CAD

[L CC synth-2D]
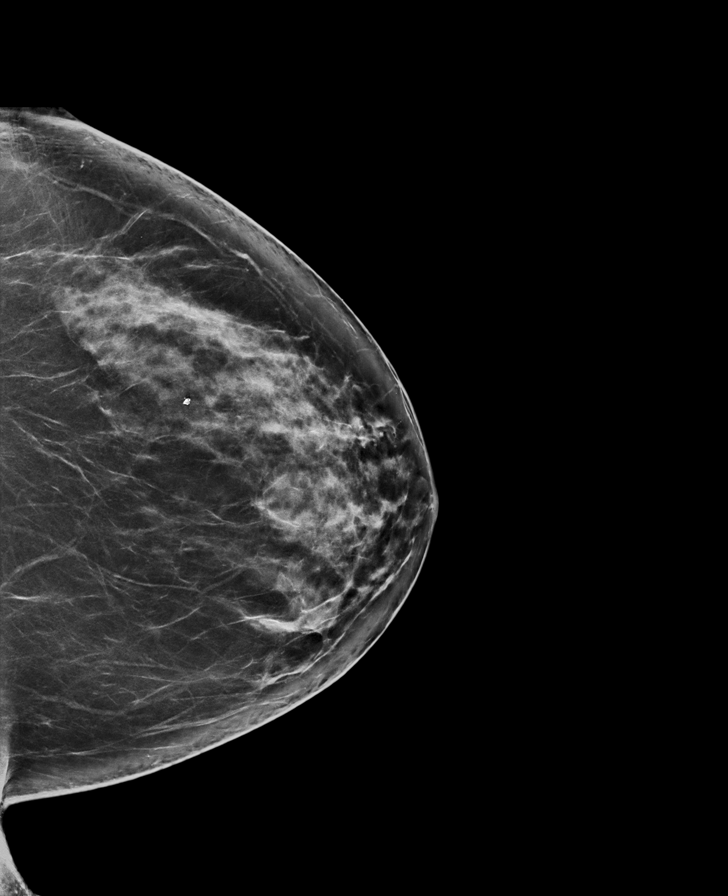

[R CC synth-2D]
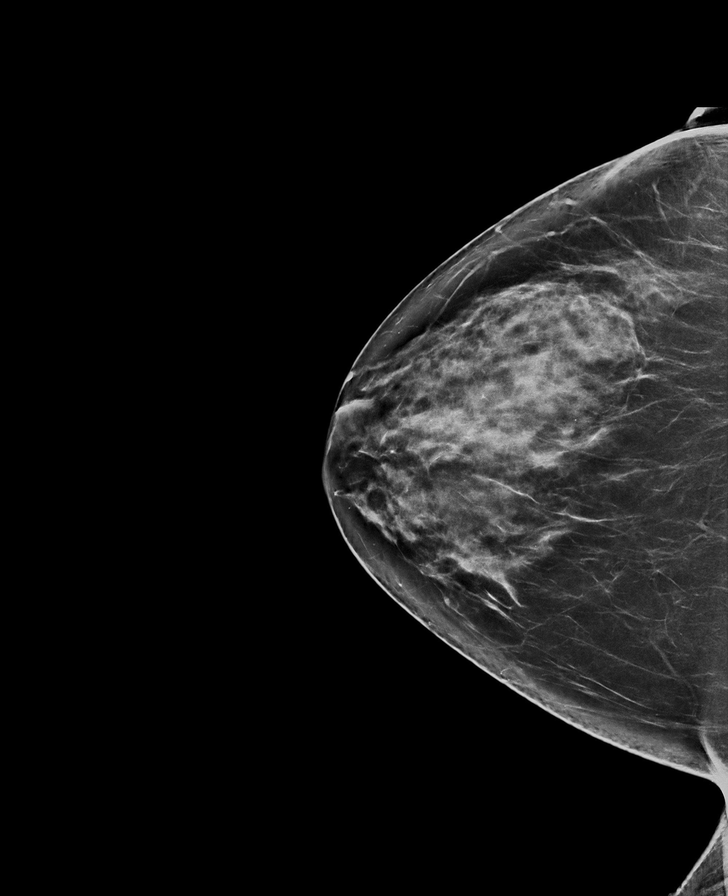

[L MLO synth-2D]
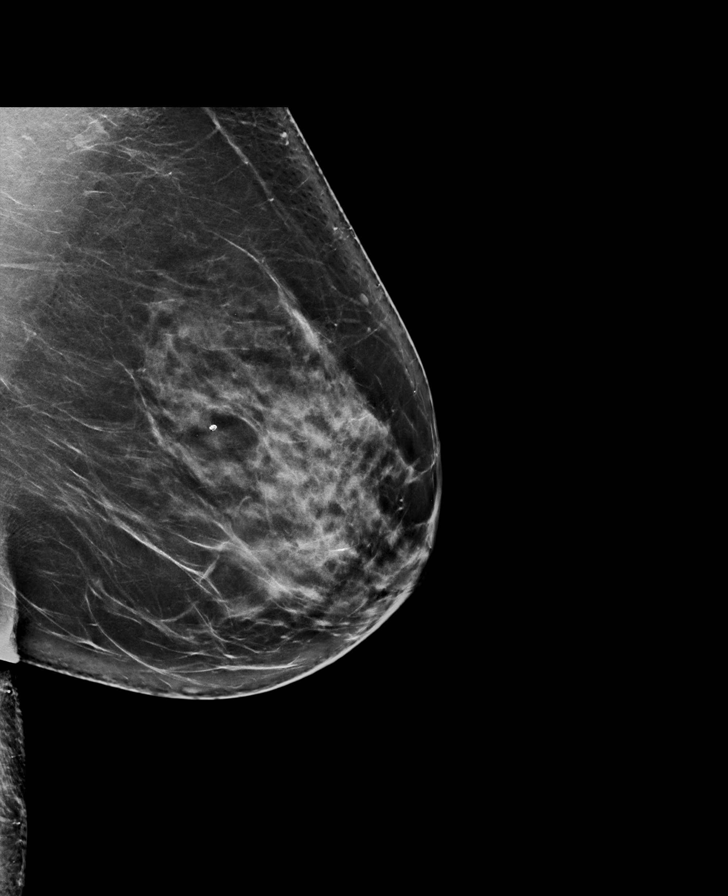

[R MLO synth-2D]
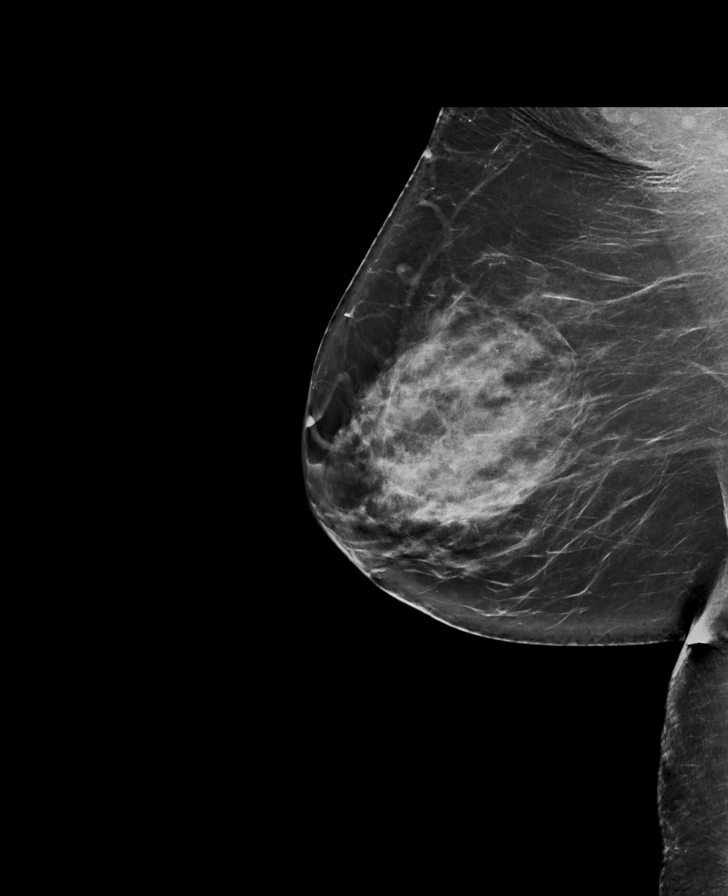

[R CC tomo · tomo slice 41/81.0]
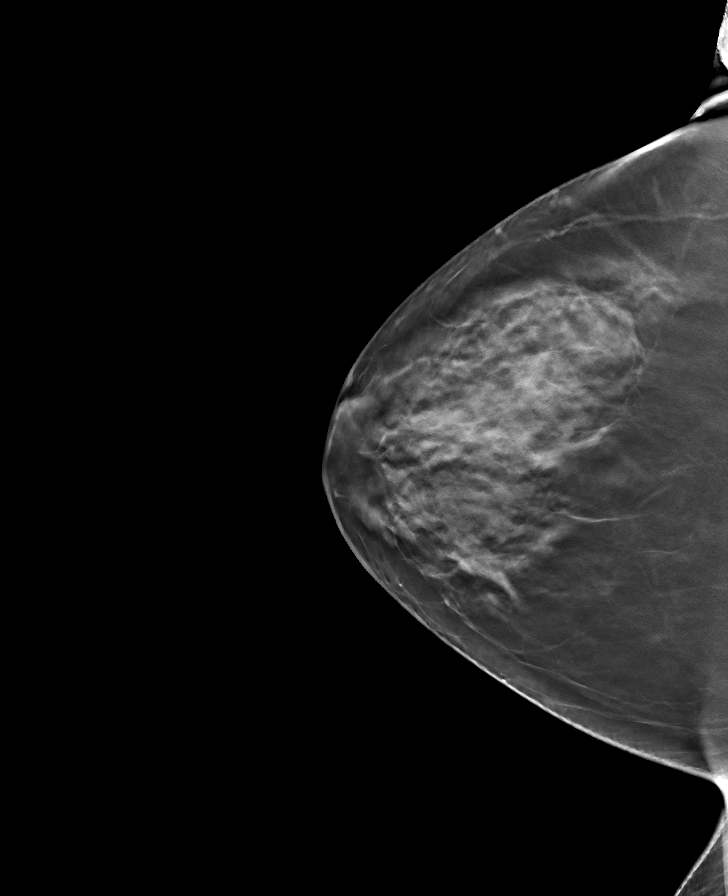

[L CC tomo · tomo slice 42/83.0]
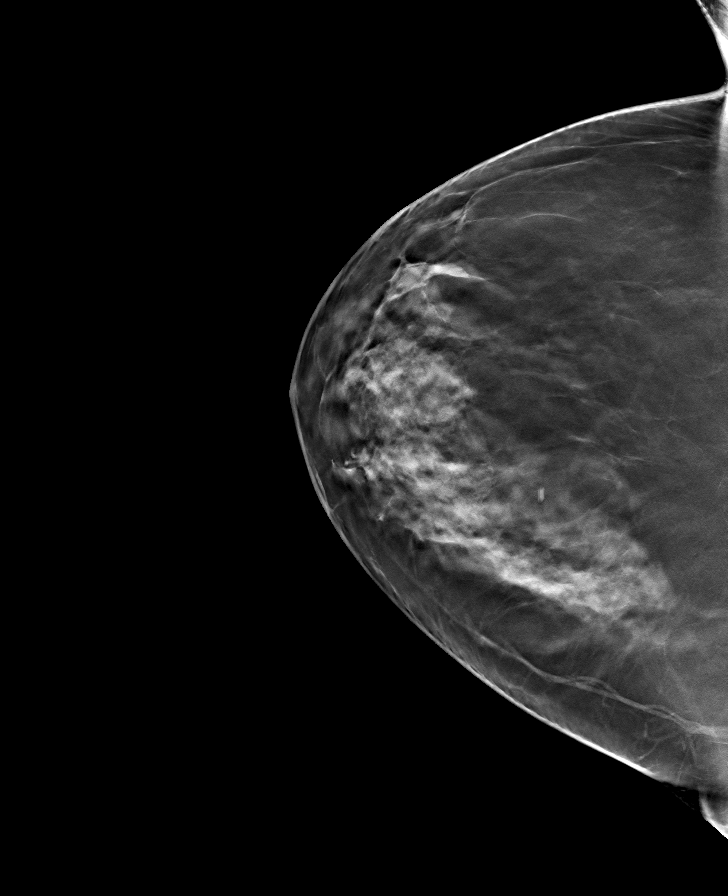

[L MLO tomo · tomo slice 45/88.0]
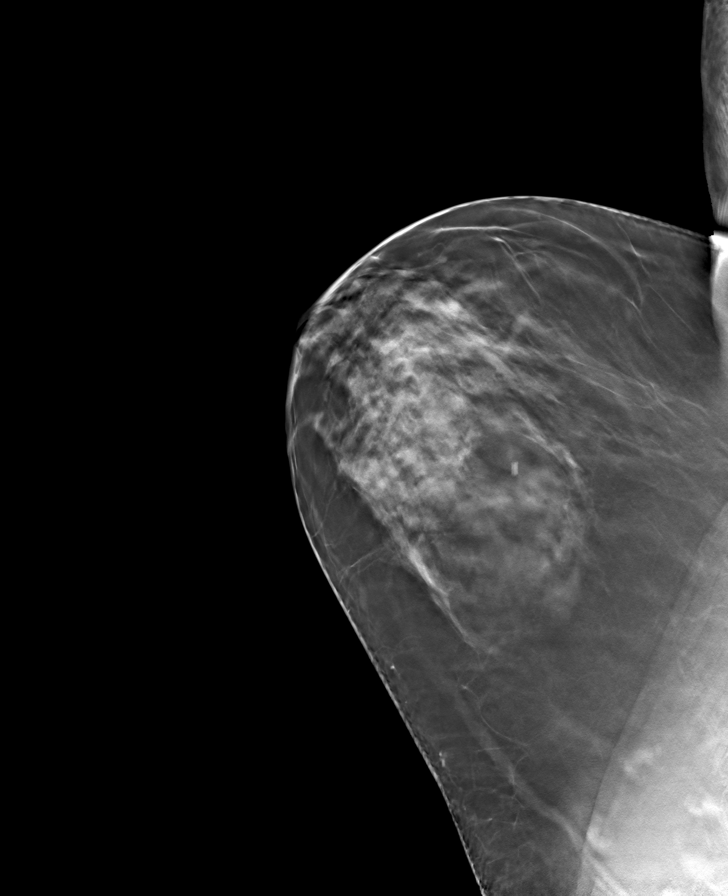

[R MLO tomo · tomo slice 45/89.0]
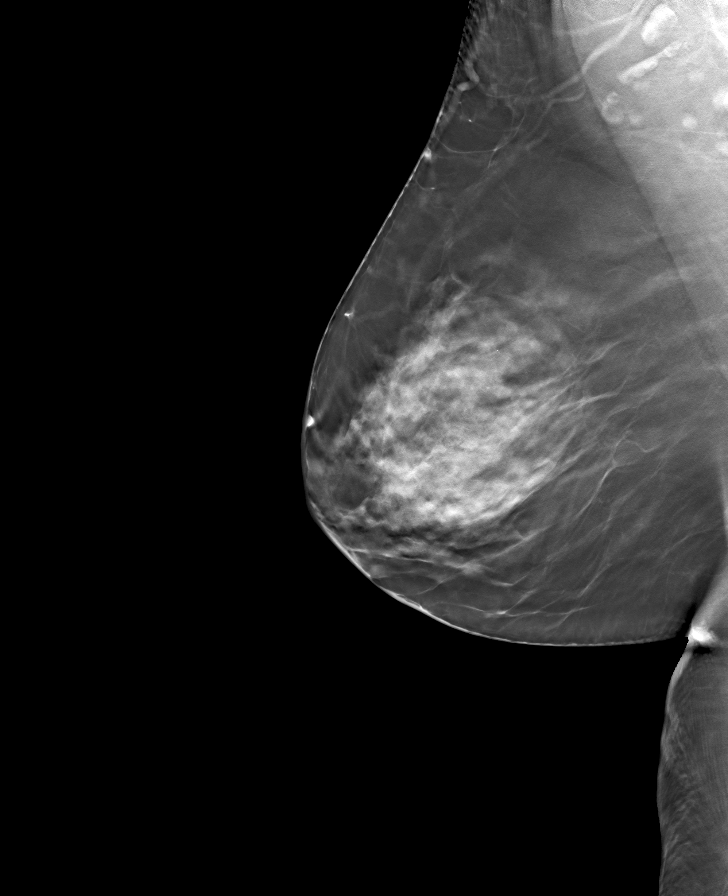

[8 of 24 positions shown; findings below may reference images not displayed]

ACR Breast Density Category c: The breast tissue is heterogeneously
dense, which may obscure small masses.
FINDINGS: There are no findings suspicious for malignancy. Images were
processed with CAD.
IMPRESSION: No mammographic evidence of malignancy. A result letter of this
screening mammogram will be mailed directly to the patient.

RECOMMENDATION:
Screening mammogram in one year. (Code:[5V])

BI-RADS CATEGORY  1: Negative.

## 2018-06-08 MED ORDER — DICLOFENAC SODIUM 1 % TD GEL: 4.0000 g | Freq: Four times a day (QID) | TRANSDERMAL | 3 refills | Status: DC

## 2018-06-08 MED ORDER — LISINOPRIL 10 MG PO TABS: 10.0000 mg | ORAL_TABLET | Freq: Every day | ORAL | 3 refills | Status: DC

## 2018-06-08 MED ORDER — HYDROCHLOROTHIAZIDE 25 MG PO TABS: 25.0000 mg | ORAL_TABLET | Freq: Every day | ORAL | 1 refills | Status: DC

## 2018-06-08 MED ORDER — SIMVASTATIN 40 MG PO TABS: 40.0000 mg | ORAL_TABLET | Freq: Every day | ORAL | 1 refills | Status: DC

## 2018-06-08 NOTE — Assessment & Plan Note (Signed)
Tolerating statin, encouraged heart healthy diet, avoid trans fats, minimize simple carbs and saturated fats. Increase exercise as tolerated 

## 2018-06-08 NOTE — Progress Notes (Signed)
Subjective:     Heather Pierce is a 67 y.o. female and is here for a comprehensive physical exam. The patient reports no problems.  Social History   Socioeconomic History  . Marital status: Single    Spouse name: Not on file  . Number of children: Not on file  . Years of education: Not on file  . Highest education level: Not on file  Occupational History  . Occupation: retired    Comment: [employer]    Employer: [employer]  Social Needs  . Financial resource strain: Not on file  . Food insecurity:    Worry: Not on file    Inability: Not on file  . Transportation needs:    Medical: Not on file    Non-medical: Not on file  Tobacco Use  . Smoking status: Never Smoker  . Smokeless tobacco: Never Used  Substance and Sexual Activity  . Alcohol use: Yes    Comment: occ. maybe 2 drinks per month per pt.  . Drug use: No  . Sexual activity: Never    Partners: Male    Birth control/protection: None  Lifestyle  . Physical activity:    Days per week: 7 days    Minutes per session: 150+ min  . Stress: Not on file  Relationships  . Social connections:    Talks on phone: Not on file    Gets together: Not on file    Attends religious service: Not on file    Active member of club or organization: Not on file    Attends meetings of clubs or organizations: Not on file    Relationship status: Not on file  . Intimate partner violence:    Fear of current or ex partner: Not on file    Emotionally abused: Not on file    Physically abused: Not on file    Forced sexual activity: Not on file  Other Topics Concern  . Not on file  Social History Narrative   Exercise-- walking dog   Health Maintenance  Topic Date Due  . DEXA SCAN  05/14/2018  . MAMMOGRAM  05/27/2018  . TETANUS/TDAP  04/19/2019  . COLONOSCOPY  01/16/2023  . INFLUENZA VACCINE  Completed  . Hepatitis C Screening  Completed  . PNA vac Low Risk Adult  Completed    The following portions of the patient's history were  reviewed and updated as appropriate: She  has a past medical history of Allergy, GERD (gastroesophageal reflux disease), Hyperlipidemia, and Hypertension. She does not have any pertinent problems on file. She  has a past surgical history that includes Knee arthroscopy (11/2009). Her family history includes Alzheimer's disease in her father and mother; Arthritis in her unknown relative; COPD in her father; Cancer in her father; Colon cancer (age of onset: 96) in her maternal grandmother; Colon polyps in her father; Coronary artery disease in her unknown relative; Dementia in her father and mother; GER disease in her father; Hyperlipidemia in her unknown relative; Hypertension in her unknown relative; Jaundice in her father; Melanoma in her unknown relative; Osteoarthritis in her father. She  reports that she has never smoked. She has never used smokeless tobacco. She reports that she drinks alcohol. She reports that she does not use drugs. She has a current medication list which includes the following prescription(s): hydrochlorothiazide, fish oil, simvastatin, diclofenac sodium, and lisinopril. Current Outpatient Medications on File Prior to Visit  Medication Sig Dispense Refill  . Omega-3 Fatty Acids (FISH OIL) 1000 MG CPDR  Take 1 capsule by mouth daily.     No current facility-administered medications on file prior to visit.    She has No Known Allergies..  Review of Systems Review of Systems  Constitutional: Negative for activity change, appetite change and fatigue.  HENT: Negative for hearing loss, congestion, tinnitus and ear discharge.  dentist q39m Eyes: Negative for visual disturbance (see optho q1y -- vision corrected to 20/20 with glasses).  Respiratory: Negative for cough, chest tightness and shortness of breath.   Cardiovascular: Negative for chest pain, palpitations and leg swelling.  Gastrointestinal: Negative for abdominal pain, diarrhea, constipation and abdominal distention.   Genitourinary: Negative for urgency, frequency, decreased urine volume and difficulty urinating.  Musculoskeletal: Negative for back pain, arthralgias and gait problem.  Skin: Negative for color change, pallor and rash.  Neurological: Negative for dizziness, light-headedness, numbness and headaches.  Hematological: Negative for adenopathy. Does not bruise/bleed easily.  Psychiatric/Behavioral: Negative for suicidal ideas, confusion, sleep disturbance, self-injury, dysphoric mood, decreased concentration and agitation.       Objective:    BP (!) 142/88   Pulse 78   Temp 97.6 F (36.4 C) (Oral)   Resp 16   Ht 5\' 5"  (1.651 m)   Wt 203 lb 12.8 oz (92.4 kg)   LMP 09/24/2005   SpO2 98%   BMI 33.91 kg/m  General appearance: alert, cooperative, appears stated age and no distress Head: Normocephalic, without obvious abnormality, atraumatic Eyes: negative findings: lids and lashes normal, conjunctivae and sclerae normal and pupils equal, round, reactive to light and accomodation Ears: normal TM's and external ear canals both ears Nose: Nares normal. Septum midline. Mucosa normal. No drainage or sinus tenderness. Throat: lips, mucosa, and tongue normal; teeth and gums normal Neck: no adenopathy, no carotid bruit, no JVD, supple, symmetrical, trachea midline and thyroid not enlarged, symmetric, no tenderness/mass/nodules Back: symmetric, no curvature. ROM normal. No CVA tenderness. Lungs: clear to auscultation bilaterally Breasts: normal appearance, no masses or tenderness Heart: regular rate and rhythm, S1, S2 normal, no murmur, click, rub or gallop Abdomen: soft, non-tender; bowel sounds normal; no masses,  no organomegaly Pelvic: not indicated; post-menopausal, no abnormal Pap smears in past Extremities: extremities normal, atraumatic, no cyanosis or edema Pulses: 2+ and symmetric Skin: Skin color, texture, turgor normal. No rashes or lesions Lymph nodes: Cervical, supraclavicular,  and axillary nodes normal. Neurologic: Alert and oriented X 3, normal strength and tone. Normal symmetric reflexes. Normal coordination and gait    Assessment:    Healthy female exam.      Plan:    ghm utd Check labs  See After Visit Summary for Counseling Recommendations    1. Estrogen deficiency Ca and vita d  Weight bearing exercise - DG Bone Density; Future  2. Essential hypertension Poorly controlled will alter medications, encouraged DASH diet, minimize caffeine and obtain adequate sleep. Report concerning symptoms and follow up as directed and as needed - CBC with Differential/Platelet - Comprehensive metabolic panel - hydrochlorothiazide (HYDRODIURIL) 25 MG tablet; Take 1 tablet (25 mg total) by mouth daily.  Dispense: 180 tablet; Refill: 1 - lisinopril (PRINIVIL,ZESTRIL) 10 MG tablet; Take 1 tablet (10 mg total) by mouth daily.  Dispense: 90 tablet; Refill: 3  3. Hyperlipidemia LDL goal <100 Tolerating statin, encouraged heart healthy diet, avoid trans fats, minimize simple carbs and saturated fats. Increase exercise as tolerated - Comprehensive metabolic panel - Lipid panel - simvastatin (ZOCOR) 40 MG tablet; Take 1 tablet (40 mg total) by mouth daily.  Dispense: 180  ablet; Refill: 1  4. Hyperlipidemia, unspecified hyperlipidemia type

## 2018-06-08 NOTE — Patient Instructions (Signed)
Preventive Care 65 Years and Older, Female Preventive care refers to lifestyle choices and visits with your health care provider that can promote health and wellness. What does preventive care include?  A yearly physical exam. This is also called an annual well check.  Dental exams once or twice a year.  Routine eye exams. Ask your health care provider how often you should have your eyes checked.  Personal lifestyle choices, including: ? Daily care of your teeth and gums. ? Regular physical activity. ? Eating a healthy diet. ? Avoiding tobacco and drug use. ? Limiting alcohol use. ? Practicing safe sex. ? Taking low-dose aspirin every day. ? Taking vitamin and mineral supplements as recommended by your health care provider. What happens during an annual well check? The services and screenings done by your health care provider during your annual well check will depend on your age, overall health, lifestyle risk factors, and family history of disease. Counseling Your health care provider may ask you questions about your:  Alcohol use.  Tobacco use.  Drug use.  Emotional well-being.  Home and relationship well-being.  Sexual activity.  Eating habits.  History of falls.  Memory and ability to understand (cognition).  Work and work environment.  Reproductive health.  Screening You may have the following tests or measurements:  Height, weight, and BMI.  Blood pressure.  Lipid and cholesterol levels. These may be checked every 5 years, or more frequently if you are over 50 years old.  Skin check.  Lung cancer screening. You may have this screening every year starting at age 55 if you have a 30-pack-year history of smoking and currently smoke or have quit within the past 15 years.  Fecal occult blood test (FOBT) of the stool. You may have this test every year starting at age 50.  Flexible sigmoidoscopy or colonoscopy. You may have a sigmoidoscopy every 5 years or  a colonoscopy every 10 years starting at age 50.  Hepatitis C blood test.  Hepatitis B blood test.  Sexually transmitted disease (STD) testing.  Diabetes screening. This is done by checking your blood sugar (glucose) after you have not eaten for a while (fasting). You may have this done every 1-3 years.  Bone density scan. This is done to screen for osteoporosis. You may have this done starting at age 65.  Mammogram. This may be done every 1-2 years. Talk to your health care provider about how often you should have regular mammograms.  Talk with your health care provider about your test results, treatment options, and if necessary, the need for more tests. Vaccines Your health care provider may recommend certain vaccines, such as:  Influenza vaccine. This is recommended every year.  Tetanus, diphtheria, and acellular pertussis (Tdap, Td) vaccine. You may need a Td booster every 10 years.  Varicella vaccine. You may need this if you have not been vaccinated.  Zoster vaccine. You may need this after age 60.  Measles, mumps, and rubella (MMR) vaccine. You may need at least one dose of MMR if you were born in 1957 or later. You may also need a second dose.  Pneumococcal 13-valent conjugate (PCV13) vaccine. One dose is recommended after age 65.  Pneumococcal polysaccharide (PPSV23) vaccine. One dose is recommended after age 65.  Meningococcal vaccine. You may need this if you have certain conditions.  Hepatitis A vaccine. You may need this if you have certain conditions or if you travel or work in places where you may be exposed to hepatitis   A.  Hepatitis B vaccine. You may need this if you have certain conditions or if you travel or work in places where you may be exposed to hepatitis B.  Haemophilus influenzae type b (Hib) vaccine. You may need this if you have certain conditions.  Talk to your health care provider about which screenings and vaccines you need and how often you  need them. This information is not intended to replace advice given to you by your health care provider. Make sure you discuss any questions you have with your health care provider. Document Released: 07/14/2015 Document Revised: 03/06/2016 Document Reviewed: 04/18/2015 Elsevier Interactive Patient Education  2018 Elsevier Inc.  

## 2018-06-08 NOTE — Assessment & Plan Note (Signed)
Poorly controlled will alter medications, encouraged DASH diet, minimize caffeine and obtain adequate sleep. Report concerning symptoms and follow up as directed and as needed 

## 2018-07-14 ENCOUNTER — Encounter: Payer: Self-pay | Admitting: Family Medicine

## 2018-07-14 NOTE — Telephone Encounter (Signed)
Ok to do 3 month ov

## 2018-09-07 ENCOUNTER — Ambulatory Visit: Payer: Medicare Other | Admitting: Family Medicine

## 2018-09-07 ENCOUNTER — Encounter: Payer: Self-pay | Admitting: Family Medicine

## 2018-09-07 DIAGNOSIS — E785 Hyperlipidemia, unspecified: Secondary | ICD-10-CM | POA: Diagnosis not present

## 2018-09-07 DIAGNOSIS — I10 Essential (primary) hypertension: Secondary | ICD-10-CM | POA: Diagnosis not present

## 2018-09-07 MED ORDER — LISINOPRIL-HYDROCHLOROTHIAZIDE 20-25 MG PO TABS: 1.0000 | ORAL_TABLET | Freq: Every day | ORAL | 3 refills | Status: DC

## 2018-09-07 NOTE — Assessment & Plan Note (Signed)
Tolerating statin, encouraged heart healthy diet, avoid trans fats, minimize simple carbs and saturated fats. Increase exercise as tolerated 

## 2018-09-07 NOTE — Assessment & Plan Note (Addendum)
Poorly controlled will alter medications, encouraged DASH diet, minimize caffeine and obtain adequate sleep. Report concerning symptoms and follow up as directed and as needed 

## 2018-09-07 NOTE — Patient Instructions (Signed)

## 2018-09-07 NOTE — Progress Notes (Signed)
Patient ID: Heather Pierce, female    DOB: Jun 16, 1951  Age: 68 y.o. MRN: [id number]    Subjective:  Subjective  HPI Heather Pierce presents for bp check .   No complaints.    Review of Systems  Constitutional: Negative for appetite change, diaphoresis, fatigue and unexpected weight change.  Eyes: Negative for pain, redness and visual disturbance.  Respiratory: Negative for cough, chest tightness, shortness of breath and wheezing.   Cardiovascular: Negative for chest pain, palpitations and leg swelling.  Endocrine: Negative for cold intolerance, heat intolerance, polydipsia, polyphagia and polyuria.  Genitourinary: Negative for difficulty urinating, dysuria and frequency.  Neurological: Negative for dizziness, light-headedness, numbness and headaches.    History Past Medical History:  Diagnosis Date  . Allergy   . GERD (gastroesophageal reflux disease)   . Hyperlipidemia   . Hypertension     She has a past surgical history that includes Knee arthroscopy (11/2009).   Her family history includes Alzheimer's disease in her father and mother; Arthritis in her unknown relative; COPD in her father; Cancer in her father; Colon cancer (age of onset: 18) in her maternal grandmother; Colon polyps in her father; Coronary artery disease in her unknown relative; Dementia in her father and mother; GER disease in her father; Hyperlipidemia in her unknown relative; Hypertension in her unknown relative; Jaundice in her father; Melanoma in her unknown relative; Osteoarthritis in her father.She reports that she has never smoked. She has never used smokeless tobacco. She reports current alcohol use. She reports that she does not use drugs.  Current Outpatient Medications on File Prior to Visit  Medication Sig Dispense Refill  . diclofenac sodium (VOLTAREN) 1 % GEL Apply 4 g topically 4 (four) times daily. 100 g 3  . hydrochlorothiazide (HYDRODIURIL) 25 MG tablet Take 1 tablet (25 mg total) by mouth daily.  180 tablet 1  . lisinopril (PRINIVIL,ZESTRIL) 10 MG tablet Take 1 tablet (10 mg total) by mouth daily. 90 tablet 3  . Omega-3 Fatty Acids (FISH OIL) 1000 MG CPDR Take 1 capsule by mouth daily.    . simvastatin (ZOCOR) 40 MG tablet Take 1 tablet (40 mg total) by mouth daily. 180 tablet 1   No current facility-administered medications on file prior to visit.      Objective:  Objective  Physical Exam Vitals signs and nursing note reviewed.  Constitutional:      Appearance: She is well-developed.  HENT:     Head: Normocephalic and atraumatic.  Eyes:     Conjunctiva/sclera: Conjunctivae normal.  Neck:     Musculoskeletal: Normal range of motion and neck supple.     Thyroid: No thyromegaly.     Vascular: No carotid bruit or JVD.  Cardiovascular:     Rate and Rhythm: Normal rate and regular rhythm.     Heart sounds: Normal heart sounds. No murmur.  Pulmonary:     Effort: Pulmonary effort is normal. No respiratory distress.     Breath sounds: Normal breath sounds. No wheezing or rales.  Chest:     Chest wall: No tenderness.  Neurological:     Mental Status: She is alert and oriented to person, place, and time.    BP (!) 142/80   Pulse 85   Resp 14   Ht 5\' 5"  (1.651 m)   Wt 205 lb (93 kg)   LMP 09/24/2005   SpO2 98%   BMI 34.11 kg/m  Wt Readings from Last 3 Encounters:  09/07/18 205 lb (93 kg)  06/08/18 203  lb 12.8 oz (92.4 kg)  05/08/18 205 lb 6.4 oz (93.2 kg)     Lab Results  Component Value Date   WBC 6.4 06/08/2018   HGB 14.0 06/08/2018   HCT 40.8 06/08/2018   PLT 185.0 06/08/2018   GLUCOSE 100 (H) 06/08/2018   CHOL 152 06/08/2018   TRIG 119.0 06/08/2018   HDL 57.30 06/08/2018   LDLDIRECT 148.1 04/18/2009   LDLCALC 71 06/08/2018   ALT 21 06/08/2018   AST 19 06/08/2018   NA 140 06/08/2018   K 3.6 06/08/2018   CL 101 06/08/2018   CREATININE 0.94 06/08/2018   BUN 18 06/08/2018   CO2 30 06/08/2018   TSH 1.18 04/03/2015   MICROALBUR 1.2 04/03/2015    Dg  Bone Density  Result Date: 06/08/2018 EXAM: DUAL X-RAY ABSORPTIOMETRY (DXA) FOR BONE MINERAL DENSITY IMPRESSION: Heather Pierce Your patient Heather Pierce completed a BMD test on 06/08/2018 using the [place] (analysis version: 16.SP2) manufactured by [employer]. The following summarizes the results of our evaluation. PATIENT: Name: Heather, Pierce Patient ID: [id number] Birth Date: [birth year] Height: 63.5 in. Gender: Female Measured: 06/08/2018 Weight: 197.2 lbs. Indications: Advanced Age, Caucasian, Estrogen Deficiency, History of Fracture (Adult), Low Calcium Intake, Parent hip Fx, Post Menopausal, Vitamin D Deficiency Fractures: Wrist Treatments: Multivitamin ASSESSMENT: The BMD measured at Femur Neck Right is 1.076 g/cm2 with a T-score of 0.3. This patient is considered normal according to [facility]) criteria. L- 4 was excluded due to degenerative changes. The scan quality was good. Site Region Measured Date Measured Age WHO YA BMD Classification T-score AP Spine L1-L3 06/08/2018 67.4 Normal 1.7 1.377 g/cm2 AP Spine L1-L3 05/14/2016 65.4 Normal 1.3 1.325 g/cm2 DualFemur Neck Right 06/08/2018 67.4 Normal 0.3 1.076 g/cm2 DualFemur Neck Right 05/14/2016 65.4 Normal 0.1 1.049 g/cm2 DualFemur Total Mean 06/08/2018 67.4 Normal 0.8 1.111 g/cm2 DualFemur Total Mean 05/14/2016 65.4 Normal 0.7 1.094 g/cm2 Right Forearm Radius 33% 06/08/2018 67.4 Normal 0.8 0.943 g/cm2 World Health Organization [facility]) criteria for post-menopausal, Caucasian Women: Normal        T-score at or above -1 SD Low Bone Mass T-score between -1 and -2.5 SD Osteoporosis  T-score at or below -2.5 SD RECOMMENDATION:1. All patients should optimize calcium and vitamin D intake. 2. Consider FDA-approved medical therapies in postmenopausal women and men aged 20 years and older, based on the following: a. A hip or vertebral(clinical or morphometric) fracture. b. T-Score < -2.5 at the femoral neck or spine after  appropriate evaluation to exclude secondary causes c. Low bone mass (T-score between -1.0 and -2.5 at the femoral neck or spine) and a 10 year probability of a hip fracture >3% or a 10 year probability of major osteoporosis-related fracture > 20% based on the [place]-adapted WHO algorithm d. Clinical judgement and/or patient preferences may indicate treatment for people with 10-year fracture probabilities above or below these levels FOLLOW-UP: Patients with diagnosis of osteoporosis or at high risk for fracture should have regular bone mineral density tests. For patients eligible for Medicare, routine testing is allowed once every 2 years. The testing frequency can be increased to one year for patients who have rapidly progressing disease, those who are receiving or discontinuing medical therapy to restore bone mass, or have additional risk factors. I have reviewed this report, anf agree with the above findings. [facility] Radiology Electronically Signed   By: Rolm Baptise M.D.   On: 06/08/2018 10:12   Mm 3d Screen Breast Bilateral  Result Date:  06/08/2018 CLINICAL DATA:  Screening. EXAM: DIGITAL SCREENING BILATERAL MAMMOGRAM WITH TOMO AND CAD COMPARISON:  Previous exam(s). ACR Breast Density Category c: The breast tissue is heterogeneously dense, which may obscure small masses. FINDINGS: There are no findings suspicious for malignancy. Images were processed with CAD. IMPRESSION: No mammographic evidence of malignancy. A result letter of this screening mammogram will be mailed directly to the patient. RECOMMENDATION: Screening mammogram in one year. (Code:SM-B-01Y) BI-RADS CATEGORY  1: Negative. Electronically Signed   By: Lajean Manes M.D.   On: 06/08/2018 15:02     Assessment & Plan:  Plan  I am having Seward Speck start on lisinopril-hydrochlorothiazide. I am also having her maintain her Fish Oil, simvastatin, hydrochlorothiazide, lisinopril, and diclofenac sodium.  Meds ordered this encounter   Medications  . lisinopril-hydrochlorothiazide (PRINZIDE,ZESTORETIC) 20-25 MG tablet    Sig: Take 1 tablet by mouth daily.    Dispense:  90 tablet    Refill:  3    Problem List Items Addressed This Visit      Unprioritized   Essential hypertension    Poorly controlled will alter medications, encouraged DASH diet, minimize caffeine and obtain adequate sleep. Report concerning symptoms and follow up as directed and as needed       Relevant Medications   lisinopril-hydrochlorothiazide (PRINZIDE,ZESTORETIC) 20-25 MG tablet   Hyperlipidemia    Tolerating statin, encouraged heart healthy diet, avoid trans fats, minimize simple carbs and saturated fats. Increase exercise as tolerated      Relevant Medications   lisinopril-hydrochlorothiazide (PRINZIDE,ZESTORETIC) 20-25 MG tablet      Follow-up: Return in about 6 months (around 03/10/2019), or if symptoms worsen or fail to improve, for annual exam, fasting.  Ann Held, DO

## 2019-02-19 ENCOUNTER — Encounter: Payer: Self-pay | Admitting: Family Medicine

## 2019-03-04 DIAGNOSIS — Z23 Encounter for immunization: Secondary | ICD-10-CM

## 2019-03-05 ENCOUNTER — Other Ambulatory Visit: Payer: Self-pay

## 2019-03-05 ENCOUNTER — Ambulatory Visit (INDEPENDENT_AMBULATORY_CARE_PROVIDER_SITE_OTHER): Payer: Medicare Other

## 2019-03-05 DIAGNOSIS — Z23 Encounter for immunization: Secondary | ICD-10-CM

## 2019-05-03 ENCOUNTER — Other Ambulatory Visit (HOSPITAL_BASED_OUTPATIENT_CLINIC_OR_DEPARTMENT_OTHER): Payer: Self-pay | Admitting: Family Medicine

## 2019-05-03 ENCOUNTER — Encounter: Payer: Self-pay | Admitting: Family Medicine

## 2019-05-03 ENCOUNTER — Other Ambulatory Visit: Payer: Self-pay

## 2019-05-03 ENCOUNTER — Ambulatory Visit: Payer: Self-pay

## 2019-05-03 ENCOUNTER — Ambulatory Visit: Payer: Medicare Other | Admitting: Family Medicine

## 2019-05-03 VITALS — BP 138/83 | HR 85 | Ht 64.0 in | Wt 200.0 lb

## 2019-05-03 DIAGNOSIS — M1711 Unilateral primary osteoarthritis, right knee: Secondary | ICD-10-CM | POA: Insufficient documentation

## 2019-05-03 DIAGNOSIS — Z1231 Encounter for screening mammogram for malignant neoplasm of breast: Secondary | ICD-10-CM

## 2019-05-03 DIAGNOSIS — M25561 Pain in right knee: Secondary | ICD-10-CM

## 2019-05-03 NOTE — Patient Instructions (Signed)
Nice to meet you Please try ice and tylenol  Please try the exercises  You will get a call about the brace  Please send me a message in [place] with any questions or updates.  Please see me back in 4 weeks or sooner.   --Dr. Raeford Razor

## 2019-05-03 NOTE — Progress Notes (Signed)
Heather Pierce - 68 y.o. female MRN [id number]  Date of birth: [birth year]  SUBJECTIVE:  Including CC & ROS.  Chief Complaint  Patient presents with  . Knee Pain    right knee    Heather Pierce is a 68 y.o. female that is presenting with acute right knee pain.  She experienced the pain over the past few months.  She feels it over the medial compartment of the right knee.  Has a history of arthroscopy in 2009.  Felt like she stepped in a hole and that exacerbated her pain.  Felt like there may have been a pop in the posterior component.  No ecchymosis.  Has improved somewhat with measures thus far.  Pain is intermittent in nature.  Pain is mild to moderate.  No mechanical symptoms.  Pain is localized to the knee.  Has tried icing which improves the pain.  Has some pain when she walks her dog.  Independent review of the right knee x-ray from 2018 shows mild medial joint space narrowing.   Review of Systems  Constitutional: Negative for fever.  HENT: Negative for congestion.   Respiratory: Negative for cough.   Cardiovascular: Negative for chest pain.  Gastrointestinal: Negative for abdominal pain.  Musculoskeletal: Positive for arthralgias and gait problem.  Skin: Negative for color change.  Neurological: Negative for weakness.  Hematological: Negative for adenopathy.    HISTORY: Past Medical, Surgical, Social, and Family History Reviewed & Updated per EMR.   Pertinent Historical Findings include:  Past Medical History:  Diagnosis Date  . Allergy   . GERD (gastroesophageal reflux disease)   . Hyperlipidemia   . Hypertension     Past Surgical History:  Procedure Laterality Date  . KNEE ARTHROSCOPY  11/2009   left     No Known Allergies  Family History  Problem Relation Age of Onset  . Jaundice Father   . GER disease Father   . COPD Father   . Dementia Father   . Osteoarthritis Father        In nursing home  . Cancer Father        melanoma  . Alzheimer's disease  Father   . Colon polyps Father   . Alzheimer's disease Mother   . Dementia Mother   . Arthritis Unknown   . Hyperlipidemia Unknown   . Hypertension Unknown   . Coronary artery disease Unknown        1st degree relative @60s   . Melanoma Unknown   . Colon cancer Maternal Grandmother 73     Social History   Socioeconomic History  . Marital status: Single    Spouse name: Not on file  . Number of children: Not on file  . Years of education: Not on file  . Highest education level: Not on file  Occupational History  . Occupation: retired    Comment: [employer]    Employer: [employer]  Social Needs  . Financial resource strain: Not on file  . Food insecurity    Worry: Not on file    Inability: Not on file  . Transportation needs    Medical: Not on file    Non-medical: Not on file  Tobacco Use  . Smoking status: Never Smoker  . Smokeless tobacco: Never Used  Substance and Sexual Activity  . Alcohol use: Yes    Comment: occ. maybe 2 drinks per month per pt.  . Drug use: No  . Sexual activity: Never    Partners: Male  Birth control/protection: None  Lifestyle  . Physical activity    Days per week: 7 days    Minutes per session: 150+ min  . Stress: Not on file  Relationships  . Social Herbalist on phone: Not on file    Gets together: Not on file    Attends religious service: Not on file    Active member of club or organization: Not on file    Attends meetings of clubs or organizations: Not on file    Relationship status: Not on file  . Intimate partner violence    Fear of current or ex partner: Not on file    Emotionally abused: Not on file    Physically abused: Not on file    Forced sexual activity: Not on file  Other Topics Concern  . Not on file  Social History Narrative   Exercise-- walking dog     PHYSICAL EXAM:  VS: BP 138/83   Pulse 85   Ht 5\' 4"  (1.626 m)   Wt 200 lb (90.7 kg)   LMP 09/24/2005   BMI 34.33 kg/m  Physical Exam  Gen: NAD, alert, cooperative with exam, well-appearing ENT: normal lips, normal nasal mucosa,  Eye: normal EOM, normal conjunctiva and lids CV:  no edema, +2 pedal pulses   Resp: no accessory muscle use, non-labored,  GI: no masses or tenderness, no hernia  Skin: no rashes, no areas of induration  Neuro: normal tone, normal sensation to touch Psych:  normal insight, alert and oriented MSK:  Right knee: No effusion. No tenderness to palpation over the lateral joint line. Tenderness to palpation over the medial joint line and medial femoral condyle. Instability with valgus and varus stress testing. Normal range of motion. Normal strength resistance. Positive McMurray's test. Pain with patellar grind. Neurovascularly intact  Limited ultrasound: Right knee:  No effusion within the suprapatellar pouch. Normal appearing quadricep and patellar tendon. Severe joint space narrowing with degenerative changes of the meniscus. Mild degenerative changes of the lateral joint line and meniscus  Summary: Severe joint space narrowing of the medial compartment  Ultrasound and interpretation by Clearance Coots, MD    ASSESSMENT & PLAN:   Primary osteoarthritis of right knee Pain likely an exacerbation of underlying degenerative changes.  No effusion today.  Does likely have a component of patellofemoral as well. -Counseled on Voltaren over-the-counter and its use. -Counseled on home exercise therapy and supportive care. -We will pursue a medial unloader brace due to her thigh to calf ratio. -If no improvement can consider injection, physical therapy or imaging.

## 2019-05-03 NOTE — Assessment & Plan Note (Signed)
Pain likely an exacerbation of underlying degenerative changes.  No effusion today.  Does likely have a component of patellofemoral as well. -Counseled on Voltaren over-the-counter and its use. -Counseled on home exercise therapy and supportive care. -We will pursue a medial unloader brace due to her thigh to calf ratio. -If no improvement can consider injection, physical therapy or imaging.

## 2019-05-11 ENCOUNTER — Ambulatory Visit: Payer: Medicare Other | Admitting: *Deleted

## 2019-05-14 NOTE — Progress Notes (Signed)
Virtual Visit via Video Note  I connected with patient on 05/17/19 at  1:00 PM EST by audio enabled telemedicine application and verified that I am speaking with the correct person using two identifiers.   THIS ENCOUNTER IS A VIRTUAL VISIT DUE TO COVID-19 - PATIENT WAS NOT SEEN IN THE OFFICE. PATIENT HAS CONSENTED TO VIRTUAL VISIT / TELEMEDICINE VISIT   Location of patient: home  Location of provider: office  I discussed the limitations of evaluation and management by telemedicine and the availability of in person appointments. The patient expressed understanding and agreed to proceed.   Subjective:   Heather Pierce is a 68 y.o. female who presents for Medicare Annual (Subsequent) preventive examination.  Review of Systems:  Home Safety/Smoke Alarms: Feels safe in home. Smoke alarms in place.  Lives alone in 2 story home. Does well w/ stairs. Has a small rescue dog.    Female:       Mammo- scheduled 06/10/19       Dexa scan-  06/08/18.      CCS- 01/15/13. Recall 10 yrs.     Objective:     Vitals:  Unable to assess. This visit is enabled though telemedicine due to Covid 19.   Advanced Directives 05/17/2019 05/08/2018 06/26/2017 05/03/2016  Does Patient Have a Medical Advance Directive? Yes Yes Yes No  Type of Paramedic of [place];Living will [place];Living will [place] -  Does patient want to make changes to medical advance directive? No - Patient declined No - Patient declined No - Patient declined -  Copy of [place] in Chart? No - copy requested Yes - validated most recent copy scanned in chart (See row information) - -  Would patient like information on creating a medical advance directive? - - - No - patient declined information;Yes - Scientist, clinical (histocompatibility and immunogenetics) given    Tobacco Social History   Tobacco Use  Smoking Status Never Smoker  Smokeless Tobacco Never Used     Counseling given:  Not Answered   Clinical Intake: Pain : No/denies pain    Past Medical History:  Diagnosis Date  . Allergy   . GERD (gastroesophageal reflux disease)   . Hyperlipidemia   . Hypertension    Past Surgical History:  Procedure Laterality Date  . KNEE ARTHROSCOPY  11/2009   left    Family History  Problem Relation Age of Onset  . Jaundice Father   . GER disease Father   . COPD Father   . Dementia Father   . Osteoarthritis Father        In nursing home  . Cancer Father        melanoma  . Alzheimer's disease Father   . Colon polyps Father   . Alzheimer's disease Mother   . Dementia Mother   . Arthritis Other   . Hyperlipidemia Other   . Hypertension Other   . Coronary artery disease Other        1st degree relative @60s   . Melanoma Other   . Colon cancer Maternal Grandmother 55   Social History   Socioeconomic History  . Marital status: Single    Spouse name: Not on file  . Number of children: Not on file  . Years of education: Not on file  . Highest education level: Not on file  Occupational History  . Occupation: retired    Comment: [employer]    Employer: [employer]  Social Needs  .  Financial resource strain: Not on file  . Food insecurity    Worry: Not on file    Inability: Not on file  . Transportation needs    Medical: Not on file    Non-medical: Not on file  Tobacco Use  . Smoking status: Never Smoker  . Smokeless tobacco: Never Used  Substance and Sexual Activity  . Alcohol use: Yes    Comment: occ. maybe 2 drinks per month per pt.  . Drug use: No  . Sexual activity: Never    Partners: Male    Birth control/protection: None  Lifestyle  . Physical activity    Days per week: 7 days    Minutes per session: 150+ min  . Stress: Not on file  Relationships  . Social Herbalist on phone: Not on file    Gets together: Not on file    Attends religious service: Not on file    Active member of club or organization: Not on file     Attends meetings of clubs or organizations: Not on file    Relationship status: Not on file  Other Topics Concern  . Not on file  Social History Narrative   Exercise-- walking dog    Outpatient Encounter Medications as of 05/17/2019  Medication Sig  . diclofenac sodium (VOLTAREN) 1 % GEL Apply 4 g topically 4 (four) times daily.  Marland Kitchen lisinopril-hydrochlorothiazide (PRINZIDE,ZESTORETIC) 20-25 MG tablet Take 1 tablet by mouth daily.  . Omega-3 Fatty Acids (FISH OIL) 1000 MG CPDR Take 1 capsule by mouth daily.  . simvastatin (ZOCOR) 40 MG tablet Take 1 tablet (40 mg total) by mouth daily.  . [DISCONTINUED] hydrochlorothiazide (HYDRODIURIL) 25 MG tablet Take 1 tablet (25 mg total) by mouth daily.  . [DISCONTINUED] lisinopril (PRINIVIL,ZESTRIL) 10 MG tablet Take 1 tablet (10 mg total) by mouth daily.   No facility-administered encounter medications on file as of 05/17/2019.     Activities of Daily Living In your present state of health, do you have any difficulty performing the following activities: 05/17/2019  Hearing? N  Vision? N  Difficulty concentrating or making decisions? N  Walking or climbing stairs? N  Dressing or bathing? N  Doing errands, shopping? N  Preparing Food and eating ? N  Using the Toilet? N  In the past six months, have you accidently leaked urine? N  Do you have problems with loss of bowel control? N  Managing your Medications? N  Managing your Finances? N  Housekeeping or managing your Housekeeping? N  Some recent data might be hidden    Patient Care Team: Carollee Herter, Alferd Apa, DO as PCP - [place], [place], NP as Nurse Practitioner (Orthopedic Surgery)    Assessment:   This is a routine wellness examination for [place]. Physical assessment deferred to PCP.  Exercise Activities and Dietary recommendations Current Exercise Habits: Home exercise routine, Type of exercise: walking, Time (Minutes): 30, Frequency (Times/Week): 7, Weekly Exercise  (Minutes/Week): 210, Intensity: Mild, Exercise limited by: None identified Diet (meal preparation, eat out, water intake, caffeinated beverages, dairy products, fruits and vegetables): 24 hr recall Breakfast: cereal Lunch: sandwich Dinner:  cereal   Goals    . DIET - EAT MORE FRUITS AND VEGETABLES    . DIET - INCREASE WATER INTAKE    . DIET - REDUCE FAST FOOD INTAKE       Fall Risk Fall Risk  05/17/2019 05/08/2018 05/03/2016  Falls in the past year? 0 0 Yes  Number  falls in past yr: 0 - -  Injury with Fall? 0 - -  Follow up Education provided;Falls prevention discussed - -    Depression Screen PHQ 2/9 Scores 05/08/2018 05/03/2016 11/12/2012  PHQ - 2 Score 0 0 0     Cognitive Function Ad8 score reviewed for issues:  Issues making decisions:no  Less interest in hobbies / activities:no  Repeats questions, stories (family complaining):no  Trouble using ordinary gadgets (microwave, computer, phone):no  Forgets the month or year: no  Mismanaging finances: no  Remembering appts:no  Daily problems with thinking and/or memory:no Ad8 score is=0   MMSE - Mini Mental State Exam 05/03/2016  Orientation to time 5  Orientation to Place 5  Registration 3  Attention/ Calculation 5  Recall 3  Language- name 2 objects 2  Language- repeat 1  Language- follow 3 step command 3  Language- read & follow direction 1  Write a sentence 1  Copy design 1  Total score 30        Immunization History  Administered Date(s) Administered  . Fluad Quad(high Dose 65+) 03/05/2019  . Influenza, High Dose Seasonal PF 04/15/2017, 05/08/2018  . Influenza,inj,Quad PF,6+ Mos 05/04/2013, 03/25/2016  . Influenza-Unspecified 06/01/2011, 03/22/2014, 03/21/2015  . Pneumococcal Conjugate-13 05/03/2016  . Pneumococcal Polysaccharide-23 05/20/2017  . Td 04/18/2009  . Zoster 11/12/2012   Screening Tests Health Maintenance  Topic Date Due  . TETANUS/TDAP  04/19/2019  . MAMMOGRAM  06/09/2019  .  DEXA SCAN  06/08/2020  . COLONOSCOPY  01/16/2023  . INFLUENZA VACCINE  Completed  . Hepatitis C Screening  Completed  . PNA vac Low Risk Adult  Completed      Plan:   See you next year!  Continue to eat heart healthy diet (full of fruits, vegetables, whole grains, lean protein, water--limit salt, fat, and sugar intake) and increase physical activity as tolerated.  Continue doing brain stimulating activities (puzzles, reading, adult coloring books, staying active) to keep memory sharp.     I have personally reviewed and noted the following in the patient's chart:   . Medical and social history . Use of alcohol, tobacco or illicit drugs  . Current medications and supplements . Functional ability and status . Nutritional status . Physical activity . Advanced directives . List of other physicians . Hospitalizations, surgeries, and ER visits in previous 12 months . Vitals . Screenings to include cognitive, depression, and falls . Referrals and appointments  In addition, I have reviewed and discussed with patient certain preventive protocols, quality metrics, and best practice recommendations. A written personalized care plan for preventive services as well as general preventive health recommendations were provided to patient.     Shela Nevin, [place]  05/17/2019

## 2019-05-17 ENCOUNTER — Other Ambulatory Visit: Payer: Self-pay

## 2019-05-17 ENCOUNTER — Ambulatory Visit (INDEPENDENT_AMBULATORY_CARE_PROVIDER_SITE_OTHER): Payer: Medicare Other | Admitting: *Deleted

## 2019-05-17 ENCOUNTER — Encounter: Payer: Self-pay | Admitting: *Deleted

## 2019-05-17 DIAGNOSIS — Z0000 Encounter for general adult medical examination without abnormal findings: Secondary | ICD-10-CM

## 2019-05-17 NOTE — Patient Instructions (Signed)
See you next year!  Continue to eat heart healthy diet (full of fruits, vegetables, whole grains, lean protein, water--limit salt, fat, and sugar intake) and increase physical activity as tolerated.  Continue doing brain stimulating activities (puzzles, reading, adult coloring books, staying active) to keep memory sharp.    Heather Pierce , Thank you for taking time to come for your Medicare Wellness Visit. I appreciate your ongoing commitment to your health goals. Please review the following plan we discussed and let me know if I can assist you in the future.   These are the goals we discussed: Goals    . DIET - EAT MORE FRUITS AND VEGETABLES    . DIET - INCREASE WATER INTAKE    . DIET - REDUCE FAST FOOD INTAKE       This is a list of the screening recommended for you and due dates:  Health Maintenance  Topic Date Due  . Tetanus Vaccine  04/19/2019  . Mammogram  06/09/2019  . DEXA scan (bone density measurement)  06/08/2020  . Colon Cancer Screening  01/16/2023  . Flu Shot  Completed  .  Hepatitis C: One time screening is recommended by Center for Disease Control  (CDC) for  adults born from [birth year] through 1965.   Completed  . Pneumonia vaccines  Completed    Preventive Care 32 Years and Older, Female Preventive care refers to lifestyle choices and visits with your health care provider that can promote health and wellness. This includes:  A yearly physical exam. This is also called an annual well check.  Regular dental and eye exams.  Immunizations.  Screening for certain conditions.  Healthy lifestyle choices, such as diet and exercise. What can I expect for my preventive care visit? Physical exam Your health care provider will check:  Height and weight. These may be used to calculate body mass index (BMI), which is a measurement that tells if you are at a healthy weight.  Heart rate and blood pressure.  Your skin for abnormal spots. Counseling Your health care  provider may ask you questions about:  Alcohol, tobacco, and drug use.  Emotional well-being.  Home and relationship well-being.  Sexual activity.  Eating habits.  History of falls.  Memory and ability to understand (cognition).  Work and work Statistician.  Pregnancy and menstrual history. What immunizations do I need?  Influenza (flu) vaccine  This is recommended every year. Tetanus, diphtheria, and pertussis (Tdap) vaccine  You may need a Td booster every 10 years. Varicella (chickenpox) vaccine  You may need this vaccine if you have not already been vaccinated. Zoster (shingles) vaccine  You may need this after age 79. Pneumococcal conjugate (PCV13) vaccine  One dose is recommended after age 83. Pneumococcal polysaccharide (PPSV23) vaccine  One dose is recommended after age 9. Measles, mumps, and rubella (MMR) vaccine  You may need at least one dose of MMR if you were born in 1957 or later. You may also need a second dose. Meningococcal conjugate (MenACWY) vaccine  You may need this if you have certain conditions. Hepatitis A vaccine  You may need this if you have certain conditions or if you travel or work in places where you may be exposed to hepatitis A. Hepatitis B vaccine  You may need this if you have certain conditions or if you travel or work in places where you may be exposed to hepatitis B. Haemophilus influenzae type b (Hib) vaccine  You may need this if you have certain  conditions. You may receive vaccines as individual doses or as more than one vaccine together in one shot (combination vaccines). Talk with your health care provider about the risks and benefits of combination vaccines. What tests do I need? Blood tests  Lipid and cholesterol levels. These may be checked every 5 years, or more frequently depending on your overall health.  Hepatitis C test.  Hepatitis B test. Screening  Lung cancer screening. You may have this screening  every year starting at age 59 if you have a 30-pack-year history of smoking and currently smoke or have quit within the past 15 years.  Colorectal cancer screening. All adults should have this screening starting at age 82 and continuing until age 68. Your health care provider may recommend screening at age 33 if you are at increased risk. You will have tests every 1-10 years, depending on your results and the type of screening test.  Diabetes screening. This is done by checking your blood sugar (glucose) after you have not eaten for a while (fasting). You may have this done every 1-3 years.  Mammogram. This may be done every 1-2 years. Talk with your health care provider about how often you should have regular mammograms.  BRCA-related cancer screening. This may be done if you have a family history of breast, ovarian, tubal, or peritoneal cancers. Other tests  Sexually transmitted disease (STD) testing.  Bone density scan. This is done to screen for osteoporosis. You may have this done starting at age 70. Follow these instructions at home: Eating and drinking  Eat a diet that includes fresh fruits and vegetables, whole grains, lean protein, and low-fat dairy products. Limit your intake of foods with high amounts of sugar, saturated fats, and salt.  Take vitamin and mineral supplements as recommended by your health care provider.  Do not drink alcohol if your health care provider tells you not to drink.  If you drink alcohol: ? Limit how much you have to 0-1 drink a day. ? Be aware of how much alcohol is in your drink. In the [place] one drink equals one 12 oz bottle of beer (355 mL), one 5 oz glass of wine (148 mL), or one 1 oz glass of hard liquor (44 mL). Lifestyle  Take daily care of your teeth and gums.  Stay active. Exercise for at least 30 minutes on 5 or more days each week.  Do not use any products that contain nicotine or tobacco, such as cigarettes, e-cigarettes, and chewing  tobacco. If you need help quitting, ask your health care provider.  If you are sexually active, practice safe sex. Use a condom or other form of protection in order to prevent STIs (sexually transmitted infections).  Talk with your health care provider about taking a low-dose aspirin or statin. What's next?  Go to your health care provider once a year for a well check visit.  Ask your health care provider how often you should have your eyes and teeth checked.  Stay up to date on all vaccines. This information is not intended to replace advice given to you by your health care provider. Make sure you discuss any questions you have with your health care provider. Document Released: 07/14/2015 Document Revised: 06/11/2018 Document Reviewed: 06/11/2018 Elsevier Patient Education  2020 [employer].

## 2019-05-31 ENCOUNTER — Encounter: Payer: Self-pay | Admitting: Family Medicine

## 2019-05-31 ENCOUNTER — Ambulatory Visit: Payer: Medicare Other | Admitting: Family Medicine

## 2019-05-31 ENCOUNTER — Other Ambulatory Visit: Payer: Self-pay

## 2019-05-31 VITALS — BP 143/85 | HR 79 | Ht 64.0 in | Wt 200.0 lb

## 2019-05-31 DIAGNOSIS — M179 Osteoarthritis of knee, unspecified: Secondary | ICD-10-CM | POA: Insufficient documentation

## 2019-05-31 DIAGNOSIS — M25562 Pain in left knee: Secondary | ICD-10-CM | POA: Diagnosis not present

## 2019-05-31 DIAGNOSIS — M1711 Unilateral primary osteoarthritis, right knee: Secondary | ICD-10-CM | POA: Diagnosis not present

## 2019-05-31 NOTE — Assessment & Plan Note (Signed)
Has had improvement with modalities to date.  She did not seem to get much improvement with the medial unloader brace. -Counseled on home exercise therapy and supportive care. -Can consider physical therapy, imaging or injection

## 2019-05-31 NOTE — Assessment & Plan Note (Signed)
Acute pain in nature.  Likely related to underlying degenerative changes. -Counseled on home exercise therapy and supportive care. -Can consider imaging, injection or physical therapy.

## 2019-05-31 NOTE — Progress Notes (Signed)
Heather Pierce - 68 y.o. female MRN [id number]  Date of birth: 04/16/1951  SUBJECTIVE:  Including CC & ROS.  Chief Complaint  Patient presents with  . Follow-up    follow up for right knee    Heather Pierce is a 68 y.o. female that is following up for her right knee pain and presenting with left knee pain.  She reports the right knee is doing better.  The left knee started about a week ago.  She is experiencing some popping and medial sided knee pain.  The pain is intermittent in nature.  Seems to be worse when she is getting up early in the morning.  Also when she is rising after sitting for prolonged period of time.  Is intermittent in nature.  Denies any mechanical symptoms.  Has been trying to rub on medication.    Review of Systems  Constitutional: Negative for fever.  HENT: Negative for congestion.   Respiratory: Negative for cough.   Cardiovascular: Negative for chest pain.  Gastrointestinal: Negative for abdominal distention.  Musculoskeletal: Positive for arthralgias.  Neurological: Negative for weakness.  Hematological: Negative for adenopathy.    HISTORY: Past Medical, Surgical, Social, and Family History Reviewed & Updated per EMR.   Pertinent Historical Findings include:  Past Medical History:  Diagnosis Date  . Allergy   . GERD (gastroesophageal reflux disease)   . Hyperlipidemia   . Hypertension     Past Surgical History:  Procedure Laterality Date  . KNEE ARTHROSCOPY  11/2009   left     No Known Allergies  Family History  Problem Relation Age of Onset  . Jaundice Father   . GER disease Father   . COPD Father   . Dementia Father   . Osteoarthritis Father        In nursing home  . Cancer Father        melanoma  . Alzheimer's disease Father   . Colon polyps Father   . Alzheimer's disease Mother   . Dementia Mother   . Arthritis Other   . Hyperlipidemia Other   . Hypertension Other   . Coronary artery disease Other        1st degree relative  @60s   . Melanoma Other   . Colon cancer Maternal Grandmother 4     Social History   Socioeconomic History  . Marital status: Single    Spouse name: Not on file  . Number of children: Not on file  . Years of education: Not on file  . Highest education level: Not on file  Occupational History  . Occupation: retired    Comment: [employer]    Employer: [employer]  Social Needs  . Financial resource strain: Not on file  . Food insecurity    Worry: Not on file    Inability: Not on file  . Transportation needs    Medical: Not on file    Non-medical: Not on file  Tobacco Use  . Smoking status: Never Smoker  . Smokeless tobacco: Never Used  Substance and Sexual Activity  . Alcohol use: Yes    Comment: occ. maybe 2 drinks per month per pt.  . Drug use: No  . Sexual activity: Never    Partners: Male    Birth control/protection: None  Lifestyle  . Physical activity    Days per week: 7 days    Minutes per session: 150+ min  . Stress: Not on file  Relationships  . Social connections  Talks on phone: Not on file    Gets together: Not on file    Attends religious service: Not on file    Active member of club or organization: Not on file    Attends meetings of clubs or organizations: Not on file    Relationship status: Not on file  . Intimate partner violence    Fear of current or ex partner: Not on file    Emotionally abused: Not on file    Physically abused: Not on file    Forced sexual activity: Not on file  Other Topics Concern  . Not on file  Social History Narrative   Exercise-- walking dog     PHYSICAL EXAM:  VS: BP (!) 143/85   Pulse 79   Ht 5\' 4"  (1.626 m)   Wt 200 lb (90.7 kg)   LMP 09/24/2005   BMI 34.33 kg/m  Physical Exam Gen: NAD, alert, cooperative with exam, well-appearing ENT: normal lips, normal nasal mucosa,  Eye: normal EOM, normal conjunctiva and lids CV:  no edema, +2 pedal pulses   Resp: no accessory muscle use, non-labored,  Skin:  no rashes, no areas of induration  Neuro: normal tone, normal sensation to touch Psych:  normal insight, alert and oriented MSK:  Right and left knee: Tenderness to palpation of the medial joint line. Normal range of motion. Instability valgus varus stress testing. Neck McMurray's test. Neurovascularly intact     ASSESSMENT & PLAN:   Primary osteoarthritis of right knee Has had improvement with modalities to date.  She did not seem to get much improvement with the medial unloader brace. -Counseled on home exercise therapy and supportive care. -Can consider physical therapy, imaging or injection  Acute pain of left knee Acute pain in nature.  Likely related to underlying degenerative changes. -Counseled on home exercise therapy and supportive care. -Can consider imaging, injection or physical therapy.

## 2019-06-10 ENCOUNTER — Ambulatory Visit (HOSPITAL_BASED_OUTPATIENT_CLINIC_OR_DEPARTMENT_OTHER): Payer: Medicare Other

## 2019-07-12 ENCOUNTER — Ambulatory Visit (HOSPITAL_BASED_OUTPATIENT_CLINIC_OR_DEPARTMENT_OTHER)
Admission: RE | Admit: 2019-07-12 | Discharge: 2019-07-12 | Disposition: A | Discharge status: Home / Self Care | Payer: Medicare Other | Source: Ambulatory Visit | Attending: Family Medicine | Admitting: Family Medicine

## 2019-07-12 ENCOUNTER — Other Ambulatory Visit: Payer: Self-pay

## 2019-07-12 DIAGNOSIS — Z1231 Encounter for screening mammogram for malignant neoplasm of breast: Secondary | ICD-10-CM | POA: Diagnosis not present

## 2019-07-12 IMAGING — MG DIGITAL SCREENING BILAT W/ TOMO W/ CAD
6 of 10 series · 6 of 30 positions shown · non-contrast
Comparison: Previous exam(s).

CLINICAL DATA: Screening.

EXAM:
DIGITAL SCREENING BILATERAL MAMMOGRAM WITH TOMO AND CAD

[R MLO synth-2D]
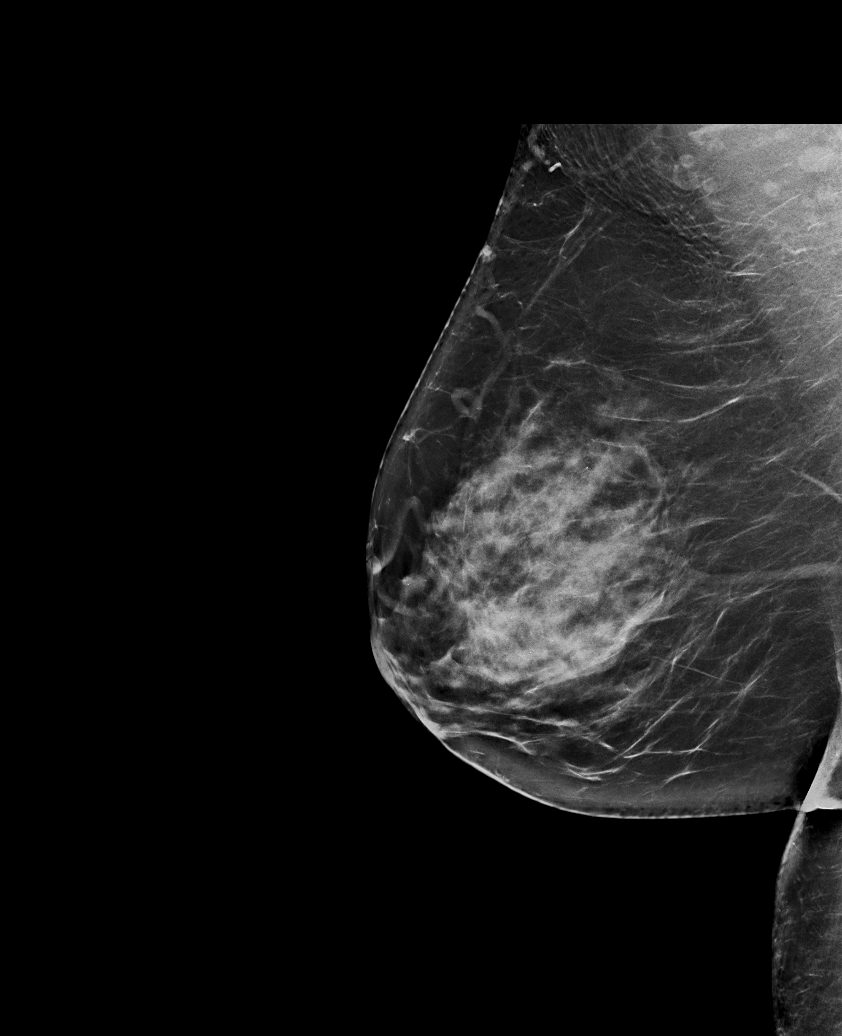

[L CC synth-2D]
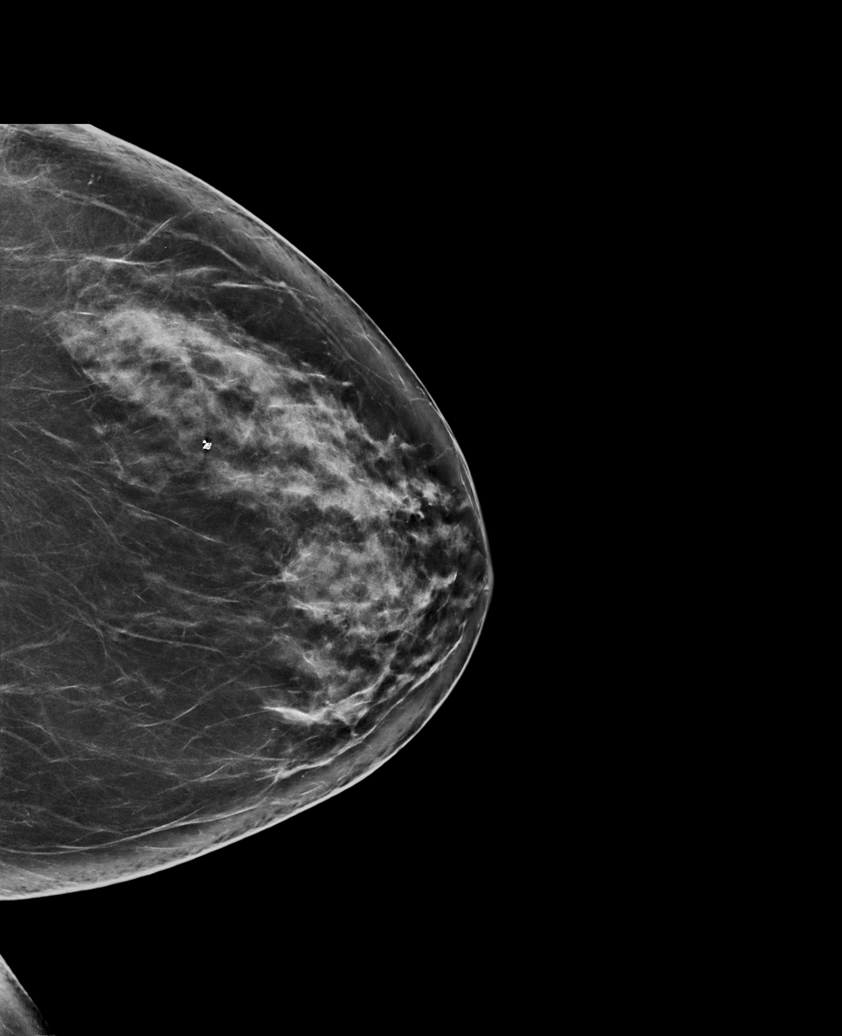

[L MLO synth-2D (1 of 2)]
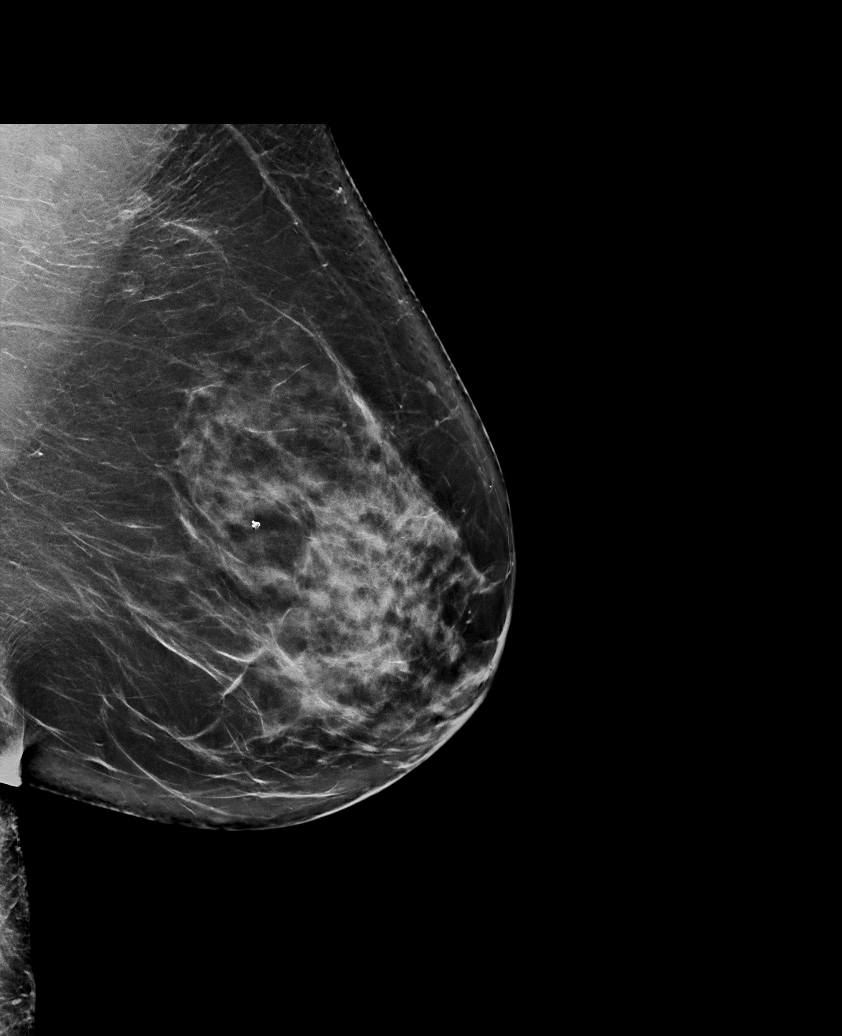

[R CC synth-2D]
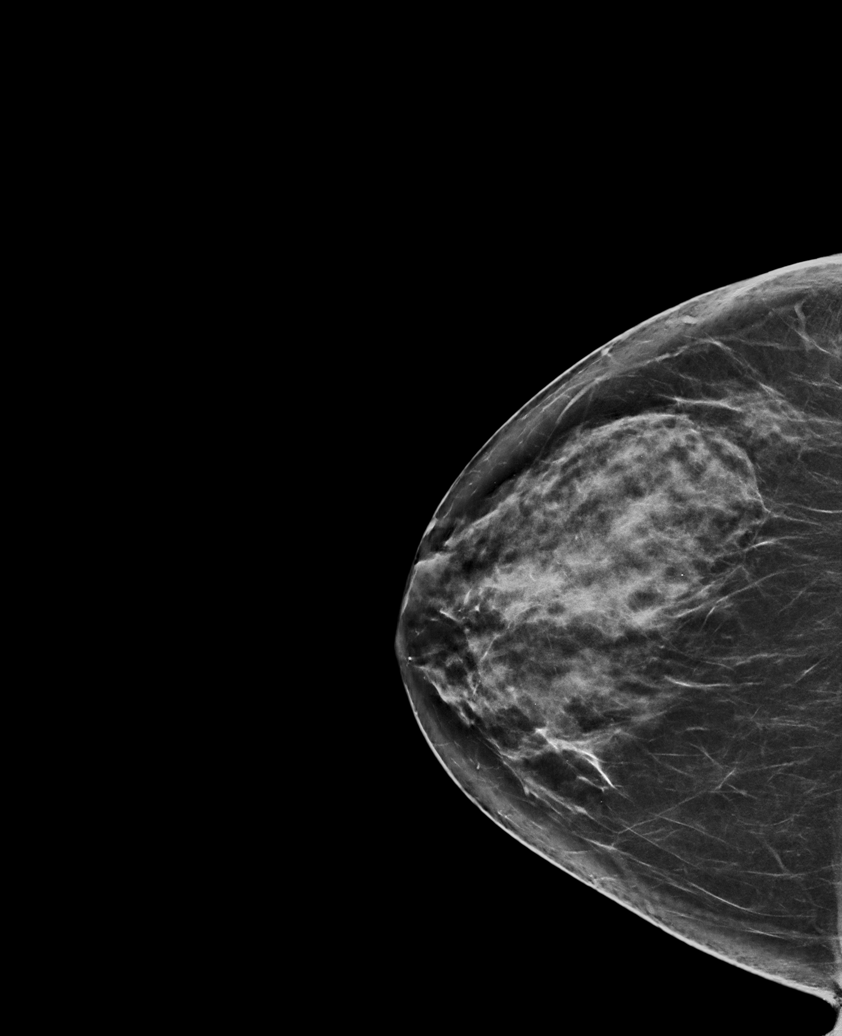

[L MLO synth-2D (2 of 2)]
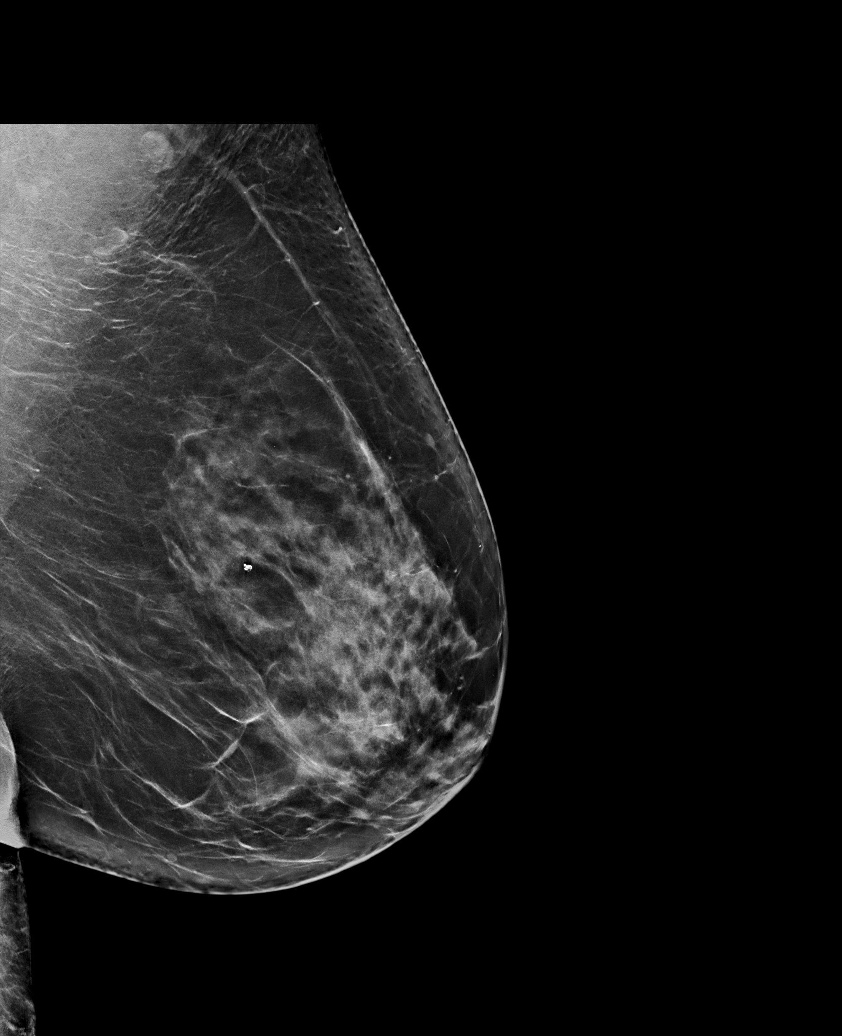

[R CC tomo · tomo slice 39/76.0]
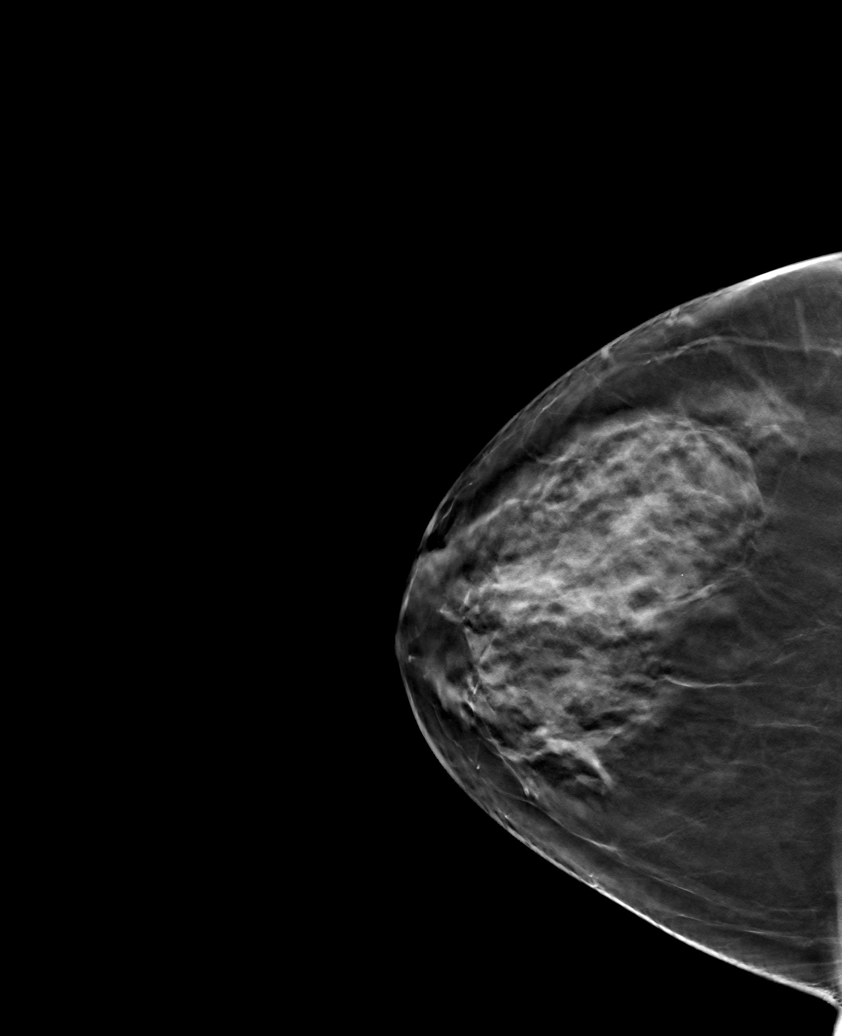

[6 of 30 positions shown; findings below may reference images not displayed]

ACR Breast Density Category c: The breast tissue is heterogeneously
dense, which may obscure small masses.
FINDINGS: There are no findings suspicious for malignancy. Images were
processed with CAD.
IMPRESSION: No mammographic evidence of malignancy. A result letter of this
screening mammogram will be mailed directly to the patient.

RECOMMENDATION:
Screening mammogram in one year. (Code:[5V])

BI-RADS CATEGORY  1: Negative.

## 2019-07-15 ENCOUNTER — Other Ambulatory Visit: Payer: Self-pay

## 2019-07-16 ENCOUNTER — Encounter: Payer: Medicare Other | Admitting: Family Medicine

## 2019-07-31 ENCOUNTER — Ambulatory Visit: Payer: Medicare Other

## 2019-08-08 ENCOUNTER — Ambulatory Visit: Payer: Medicare Other | Attending: Internal Medicine

## 2019-08-08 DIAGNOSIS — Z23 Encounter for immunization: Secondary | ICD-10-CM | POA: Insufficient documentation

## 2019-08-08 NOTE — Progress Notes (Signed)
   Covid-19 Vaccination Clinic  Name:  Heather Pierce    MRN: [id number] DOB: Sep 07, 1950  08/08/2019  [name] was observed post Covid-19 immunization for 15 minutes without incidence. She was provided with Vaccine Information Sheet and instruction to access the V-Safe system.   [name] was instructed to call 911 with any severe reactions post vaccine: Marland Kitchen Difficulty breathing  . Swelling of your face and throat  . A fast heartbeat  . A bad rash all over your body  . Dizziness and weakness    Immunizations Administered    Name Date Dose VIS Date Route   Pfizer COVID-19 Vaccine 08/08/2019  1:08 PM 0.3 mL 06/11/2019 Intramuscular   Manufacturer: [place]   Lot: [id number]   [place]: [id number]

## 2019-08-15 ENCOUNTER — Other Ambulatory Visit: Payer: Self-pay | Admitting: Family Medicine

## 2019-08-15 DIAGNOSIS — I10 Essential (primary) hypertension: Secondary | ICD-10-CM

## 2019-08-21 ENCOUNTER — Ambulatory Visit: Payer: Medicare Other

## 2019-08-21 ENCOUNTER — Other Ambulatory Visit: Payer: Self-pay | Admitting: Family Medicine

## 2019-08-21 DIAGNOSIS — E785 Hyperlipidemia, unspecified: Secondary | ICD-10-CM

## 2019-09-02 ENCOUNTER — Ambulatory Visit: Payer: Medicare Other | Attending: Internal Medicine

## 2019-09-02 DIAGNOSIS — Z23 Encounter for immunization: Secondary | ICD-10-CM

## 2019-09-02 NOTE — Progress Notes (Signed)
   Covid-19 Vaccination Clinic  Name:  Shenette Holst    MRN: [id number] DOB: [birth year]  09/02/2019  Ms. Comp was observed post Covid-19 immunization for 15 minutes without incident. She was provided with Vaccine Information Sheet and instruction to access the V-Safe system.   Ms. Venables was instructed to call 911 with any severe reactions post vaccine: Marland Kitchen Difficulty breathing  . Swelling of face and throat  . A fast heartbeat  . A bad rash all over body  . Dizziness and weakness   Immunizations Administered    Name Date Dose VIS Date Route   Pfizer COVID-19 Vaccine 09/02/2019  8:57 AM 0.3 mL 06/11/2019 Intramuscular   Manufacturer: [place]   Lot: [id number]   [place]: [id number]

## 2019-10-25 ENCOUNTER — Other Ambulatory Visit: Payer: Self-pay

## 2019-10-25 ENCOUNTER — Ambulatory Visit: Payer: Medicare Other | Admitting: Family Medicine

## 2019-10-25 ENCOUNTER — Ambulatory Visit: Payer: Self-pay

## 2019-10-25 VITALS — BP 132/77 | HR 78 | Ht 63.0 in | Wt 205.0 lb

## 2019-10-25 DIAGNOSIS — M1711 Unilateral primary osteoarthritis, right knee: Secondary | ICD-10-CM | POA: Diagnosis not present

## 2019-10-25 MED ORDER — TRIAMCINOLONE ACETONIDE 40 MG/ML IJ SUSP
40.0000 mg | Freq: Once | INTRAMUSCULAR | Status: AC
  Administered 2019-10-25: 40 mg via INTRA_ARTICULAR

## 2019-10-25 NOTE — Patient Instructions (Signed)
Good to see you Please try ice if needed Please try the exercises   Please send me a message in MyChart with any questions or updates.  Please see me back in 4 weeks or as needed.   --Dr. Raeford Razor

## 2019-10-25 NOTE — Assessment & Plan Note (Signed)
Acute exacerbation of underlying degenerative change.  History of meniscectomy several years ago.  Mild effusion on exam. -Injection. -Counseled on home exercise therapy and supportive care. -Provided samples of Pennsaid. -Could consider updated imaging, physical therapy or gel injections.

## 2019-10-25 NOTE — Progress Notes (Signed)
Heather Pierce - 69 y.o. female MRN [id number]  Date of birth: Jan 16, 1951  SUBJECTIVE:  Including CC & ROS.  No chief complaint on file.   Heather Pierce is a 69 y.o. female that is presenting with acute worsening of the right knee pain.  She has had meniscectomy several years ago.  The pain is over the medial joint line.  She is any worse with walking or going up and down stairs.   Review of Systems See HPI   HISTORY: Past Medical, Surgical, Social, and Family History Reviewed & Updated per EMR.   Pertinent Historical Findings include:  Past Medical History:  Diagnosis Date  . Allergy   . GERD (gastroesophageal reflux disease)   . Hyperlipidemia   . Hypertension     Past Surgical History:  Procedure Laterality Date  . KNEE ARTHROSCOPY  11/2009   left     Family History  Problem Relation Age of Onset  . Jaundice Father   . GER disease Father   . COPD Father   . Dementia Father   . Osteoarthritis Father        In nursing home  . Cancer Father        melanoma  . Alzheimer's disease Father   . Colon polyps Father   . Alzheimer's disease Mother   . Dementia Mother   . Arthritis Other   . Hyperlipidemia Other   . Hypertension Other   . Coronary artery disease Other        1st degree relative @60s   . Melanoma Other   . Colon cancer Maternal Grandmother 65    Social History   Socioeconomic History  . Marital status: Single    Spouse name: Not on file  . Number of children: Not on file  . Years of education: Not on file  . Highest education level: Not on file  Occupational History  . Occupation: retired    Comment: [employer]    Employer: [employer]  Tobacco Use  . Smoking status: Never Smoker  . Smokeless tobacco: Never Used  Substance and Sexual Activity  . Alcohol use: Yes    Comment: occ. maybe 2 drinks per month per pt.  . Drug use: No  . Sexual activity: Never    Partners: Male    Birth control/protection: None  Other Topics Concern  . Not on  file  Social History Narrative   Exercise-- walking dog   Social Determinants of Health   Financial Resource Strain:   . Difficulty of Paying Living Expenses:   Food Insecurity:   . Worried About Charity fundraiser in the Last Year:   . Arboriculturist in the Last Year:   Transportation Needs:   . Film/video editor (Medical):   Marland Kitchen Lack of Transportation (Non-Medical):   Physical Activity:   . Days of Exercise per Week:   . Minutes of Exercise per Session:   Stress:   . Feeling of Stress :   Social Connections:   . Frequency of Communication with Friends and Family:   . Frequency of Social Gatherings with Friends and Family:   . Attends Religious Services:   . Active Member of Clubs or Organizations:   . Attends Archivist Meetings:   Marland Kitchen Marital Status:   Intimate Partner Violence:   . Fear of Current or Ex-Partner:   . Emotionally Abused:   Marland Kitchen Physically Abused:   . Sexually Abused:      PHYSICAL EXAM:  VS: BP 132/77   Pulse 78   Ht 5\' 3"  (1.6 m)   Wt 205 lb (93 kg)   LMP 09/24/2005   BMI 36.31 kg/m  Physical Exam Gen: NAD, alert, cooperative with exam, well-appearing MSK:  Right knee: Mild effusion. Normal range of motion. Instability valgus varus stress testing. Tenderness palpation over the medial joint line. Neurovascularly intact   Aspiration/Injection Procedure Note Gertrude Claywell 01-25-1951  Procedure: Injection Indications: Right knee pains  Procedure Details Consent: Risks of procedure as well as the alternatives and risks of each were explained to the (patient/caregiver).  Consent for procedure obtained. Time Out: Verified patient identification, verified procedure, site/side was marked, verified correct patient position, special equipment/implants available, medications/allergies/relevent history reviewed, required imaging and test results available.  Performed.  The area was cleaned with iodine and alcohol swabs.    The right  knee superior lateral suprapatellar pouch was injected using 1 cc's of 40 mg Kenalog and 4 cc's of 0.25% bupivacaine with a 22 1 1/2" needle.  Ultrasound was used. Images were obtained in long views showing the injection.     A sterile dressing was applied.  Patient did tolerate procedure well.     ASSESSMENT & PLAN:   Primary osteoarthritis of right knee Acute exacerbation of underlying degenerative change.  History of meniscectomy several years ago.  Mild effusion on exam. -Injection. -Counseled on home exercise therapy and supportive care. -Provided samples of Pennsaid. -Could consider updated imaging, physical therapy or gel injections.

## 2019-10-25 NOTE — Progress Notes (Signed)
Medication Samples have been provided to the patient.  Drug name: Pennsaid       Strength: 2%        Qty: 2 Boxes  [id number]  Exp.Date: 05/2020  Dosing instructions: Use a pea size amount and rub gently.  The patient has been instructed regarding the correct time, dose, and frequency of taking this medication, including desired effects and most common side effects.   Sherrie George, [place] 10:47 AM 10/25/2019

## 2019-11-11 ENCOUNTER — Other Ambulatory Visit: Payer: Self-pay | Admitting: Family Medicine

## 2019-11-11 DIAGNOSIS — I10 Essential (primary) hypertension: Secondary | ICD-10-CM

## 2020-01-20 ENCOUNTER — Encounter: Payer: Self-pay | Admitting: Family Medicine

## 2020-01-31 ENCOUNTER — Other Ambulatory Visit: Payer: Self-pay

## 2020-01-31 ENCOUNTER — Ambulatory Visit (INDEPENDENT_AMBULATORY_CARE_PROVIDER_SITE_OTHER): Payer: Medicare Other | Admitting: Family Medicine

## 2020-01-31 ENCOUNTER — Encounter: Payer: Self-pay | Admitting: Family Medicine

## 2020-01-31 ENCOUNTER — Ambulatory Visit: Payer: Self-pay

## 2020-01-31 VITALS — BP 137/81 | HR 76 | Ht 63.0 in | Wt 205.0 lb

## 2020-01-31 DIAGNOSIS — M1711 Unilateral primary osteoarthritis, right knee: Secondary | ICD-10-CM | POA: Diagnosis not present

## 2020-01-31 MED ORDER — TRIAMCINOLONE ACETONIDE 40 MG/ML IJ SUSP
40.0000 mg | Freq: Once | INTRAMUSCULAR | Status: AC
  Administered 2020-01-31: 40 mg via INTRA_ARTICULAR

## 2020-01-31 NOTE — Progress Notes (Signed)
Heather Pierce - 69 y.o. female MRN [id number]  Date of birth: [birth year]  SUBJECTIVE:  Including CC & ROS.  Chief Complaint  Patient presents with  . Knee Pain    right x 3 weeks    Heather Pierce is a 69 y.o. female that is presenting with acute on chronic right knee pain.  She felt a pop in her knee and had subsequent pain.  The pain is improved today.  Denies any giving way or mechanical symptoms.  Had significant improvement with previous steroid injection.   Review of Systems See HPI   HISTORY: Past Medical, Surgical, Social, and Family History Reviewed & Updated per EMR.   Pertinent Historical Findings include:  Past Medical History:  Diagnosis Date  . Allergy   . GERD (gastroesophageal reflux disease)   . Hyperlipidemia   . Hypertension     Past Surgical History:  Procedure Laterality Date  . KNEE ARTHROSCOPY  11/2009   left     Family History  Problem Relation Age of Onset  . Jaundice Father   . GER disease Father   . COPD Father   . Dementia Father   . Osteoarthritis Father        In nursing home  . Cancer Father        melanoma  . Alzheimer's disease Father   . Colon polyps Father   . Alzheimer's disease Mother   . Dementia Mother   . Arthritis Other   . Hyperlipidemia Other   . Hypertension Other   . Coronary artery disease Other        1st degree relative @60s   . Melanoma Other   . Colon cancer Maternal Grandmother 50    Social History   Socioeconomic History  . Marital status: Single    Spouse name: Not on file  . Number of children: Not on file  . Years of education: Not on file  . Highest education level: Not on file  Occupational History  . Occupation: retired    Comment: [employer]    Employer: [employer]  Tobacco Use  . Smoking status: Never Smoker  . Smokeless tobacco: Never Used  Substance and Sexual Activity  . Alcohol use: Yes    Comment: occ. maybe 2 drinks per month per pt.  . Drug use: No  . Sexual activity: Never     Partners: Male    Birth control/protection: None  Other Topics Concern  . Not on file  Social History Narrative   Exercise-- walking dog   Social Determinants of Health   Financial Resource Strain:   . Difficulty of Paying Living Expenses:   Food Insecurity:   . Worried About Charity fundraiser in the Last Year:   . Arboriculturist in the Last Year:   Transportation Needs:   . Film/video editor (Medical):   Marland Kitchen Lack of Transportation (Non-Medical):   Physical Activity:   . Days of Exercise per Week:   . Minutes of Exercise per Session:   Stress:   . Feeling of Stress :   Social Connections:   . Frequency of Communication with Friends and Family:   . Frequency of Social Gatherings with Friends and Family:   . Attends Religious Services:   . Active Member of Clubs or Organizations:   . Attends Archivist Meetings:   Marland Kitchen Marital Status:   Intimate Partner Violence:   . Fear of Current or Ex-Partner:   . Emotionally Abused:   .  Physically Abused:   . Sexually Abused:      PHYSICAL EXAM:  VS: BP (!) 137/81   Pulse 76   Ht 5\' 3"  (1.6 m)   Wt (!) 205 lb (93 kg)   LMP 09/24/2005   BMI 36.31 kg/m  Physical Exam Gen: NAD, alert, cooperative with exam, well-appearing MSK:  Right knee: Mild effusion. Normal range of motion. Tenderness to palpation over the medial joint space. Neurovascularly intact   Aspiration/Injection Procedure Note Oni Dietzman [birth year]  Procedure: Injection Indications: Right knee pain  Procedure Details Consent: Risks of procedure as well as the alternatives and risks of each were explained to the (patient/caregiver).  Consent for procedure obtained. Time Out: Verified patient identification, verified procedure, site/side was marked, verified correct patient position, special equipment/implants available, medications/allergies/relevent history reviewed, required imaging and test results available.  Performed.  The area was  cleaned with iodine and alcohol swabs.    The right knee superior lateral suprapatellar pouch was injected using 1 cc's of 40 mg Kenalog and 4 cc's of 0.25% bupivacaine with a 22 1 1/2" needle.  Ultrasound was used. Images were obtained in long views showing the injection.     A sterile dressing was applied.  Patient did tolerate procedure well.     ASSESSMENT & PLAN:   Primary osteoarthritis of right knee Likely exacerbation of her underlying degenerative changes.  Mild effusion on exam today. -Counseled on home exercise therapy and supportive care. -Injection. -Counseled on compression.  Has gotten much relief with previous bracing. -Could consider gel injections and updated images for x-rays.

## 2020-01-31 NOTE — Assessment & Plan Note (Signed)
Likely exacerbation of her underlying degenerative changes.  Mild effusion on exam today. -Counseled on home exercise therapy and supportive care. -Injection. -Counseled on compression.  Has gotten much relief with previous bracing. -Could consider gel injections and updated images for x-rays.

## 2020-01-31 NOTE — Addendum Note (Signed)
Addended by: Sherrie George F on: 01/31/2020 10:39 AM   Modules accepted: Orders

## 2020-01-31 NOTE — Patient Instructions (Signed)
Good to see you Please use ice  Please use the pennsaid  You may want to try compression   Please send me a message in MyChart with any questions or updates.  Please see me back in 3 months.   --Dr. Raeford Razor

## 2020-02-05 ENCOUNTER — Other Ambulatory Visit: Payer: Self-pay | Admitting: Family Medicine

## 2020-02-05 DIAGNOSIS — I10 Essential (primary) hypertension: Secondary | ICD-10-CM

## 2020-02-16 ENCOUNTER — Other Ambulatory Visit: Payer: Self-pay

## 2020-02-16 ENCOUNTER — Ambulatory Visit: Payer: Self-pay

## 2020-02-16 ENCOUNTER — Encounter: Payer: Self-pay | Admitting: Family Medicine

## 2020-02-16 ENCOUNTER — Ambulatory Visit: Payer: Medicare Other | Admitting: Family Medicine

## 2020-02-16 VITALS — BP 136/84 | HR 81 | Ht 63.0 in | Wt 205.0 lb

## 2020-02-16 DIAGNOSIS — M1711 Unilateral primary osteoarthritis, right knee: Secondary | ICD-10-CM

## 2020-02-16 NOTE — Progress Notes (Signed)
Heather Pierce - 69 y.o. female MRN [id number]  Date of birth: [birth year]  SUBJECTIVE:  Including CC & ROS.  Chief Complaint  Patient presents with  . Follow-up    right knee / pain worse    Heather Pierce is a 69 y.o. female that is presenting with acute worsening of her right knee pain.  This feels different than her previous degenerative type pain.  She did receive an injection a couple of weeks ago and her pain resolved.  She denies any specific inciting event.  Has trouble with flexion.  Feels pain on the lateral aspect with some radiation down the lateral aspect of the leg.   Review of Systems See HPI   HISTORY: Past Medical, Surgical, Social, and Family History Reviewed & Updated per EMR.   Pertinent Historical Findings include:  Past Medical History:  Diagnosis Date  . Allergy   . GERD (gastroesophageal reflux disease)   . Hyperlipidemia   . Hypertension     Past Surgical History:  Procedure Laterality Date  . KNEE ARTHROSCOPY  11/2009   left     Family History  Problem Relation Age of Onset  . Jaundice Father   . GER disease Father   . COPD Father   . Dementia Father   . Osteoarthritis Father        In nursing home  . Cancer Father        melanoma  . Alzheimer's disease Father   . Colon polyps Father   . Alzheimer's disease Mother   . Dementia Mother   . Arthritis Other   . Hyperlipidemia Other   . Hypertension Other   . Coronary artery disease Other        1st degree relative @60s   . Melanoma Other   . Colon cancer Maternal Grandmother 20    Social History   Socioeconomic History  . Marital status: Single    Spouse name: Not on file  . Number of children: Not on file  . Years of education: Not on file  . Highest education level: Not on file  Occupational History  . Occupation: retired    Comment: [employer]    Employer: [employer]  Tobacco Use  . Smoking status: Never Smoker  . Smokeless tobacco: Never Used  Substance and Sexual  Activity  . Alcohol use: Yes    Comment: occ. maybe 2 drinks per month per pt.  . Drug use: No  . Sexual activity: Never    Partners: Male    Birth control/protection: None  Other Topics Concern  . Not on file  Social History Narrative   Exercise-- walking dog   Social Determinants of Health   Financial Resource Strain:   . Difficulty of Paying Living Expenses:   Food Insecurity:   . Worried About Charity fundraiser in the Last Year:   . Arboriculturist in the Last Year:   Transportation Needs:   . Film/video editor (Medical):   Marland Kitchen Lack of Transportation (Non-Medical):   Physical Activity:   . Days of Exercise per Week:   . Minutes of Exercise per Session:   Stress:   . Feeling of Stress :   Social Connections:   . Frequency of Communication with Friends and Family:   . Frequency of Social Gatherings with Friends and Family:   . Attends Religious Services:   . Active Member of Clubs or Organizations:   . Attends Archivist Meetings:   .  Marital Status:   Intimate Partner Violence:   . Fear of Current or Ex-Partner:   . Emotionally Abused:   Marland Kitchen Physically Abused:   . Sexually Abused:      PHYSICAL EXAM:  VS: BP 136/84   Pulse 81   Ht 5\' 3"  (1.6 m)   Wt 205 lb (93 kg)   LMP 09/24/2005   BMI 36.31 kg/m  Physical Exam Gen: NAD, alert, cooperative with exam, well-appearing MSK:  Right knee: Mild effusion. Normal range of motion in extension. Limited flexion. No joint line tenderness. No instability with valgus or varus stress testing. Neurovascularly intact   Limited ultrasound: Right knee:  Mild to moderate effusion. Normal-appearing quadricep and patellar tendon. Similar appearing degenerative changes appreciated in the medial joint space from previous scans. Increased hyperemia over the lateral meniscus. Increased hyperemia over the lateral retinaculum.  Summary: Reactive tissue appreciated around the lateral compartment of the knee.   No specific tear or acute changes appreciated.  Ultrasound and interpretation by Clearance Coots, MD   ASSESSMENT & PLAN:   Primary osteoarthritis of right knee Unclear if this pain is associated with her underlying degenerative changes.  She does have reactive tissues on the lateral aspect of the knee.  No insufficiency fracture was appreciated at this time. -Counseled on partial weightbearing. -Counseled on compression and ice. -Counseled on ibuprofen and Tylenol. -We will consider further imaging if pain is ongoing.

## 2020-02-16 NOTE — Assessment & Plan Note (Signed)
Unclear if this pain is associated with her underlying degenerative changes.  She does have reactive tissues on the lateral aspect of the knee.  No insufficiency fracture was appreciated at this time. -Counseled on partial weightbearing. -Counseled on compression and ice. -Counseled on ibuprofen and Tylenol. -We will consider further imaging if pain is ongoing.

## 2020-02-16 NOTE — Patient Instructions (Signed)
Good to see you Please use the ACE wrap  Please use ice  Please try ibuprofen and tylenol   Please send me a message in MyChart with any questions or updates.  Please see me back in 2 weeks or sooner if needed.   --Dr. Raeford Razor

## 2020-03-13 ENCOUNTER — Ambulatory Visit (HOSPITAL_BASED_OUTPATIENT_CLINIC_OR_DEPARTMENT_OTHER)
Admission: RE | Admit: 2020-03-13 | Discharge: 2020-03-13 | Disposition: A | Discharge status: Home / Self Care | Payer: Medicare Other | Source: Ambulatory Visit | Attending: Family Medicine | Admitting: Family Medicine

## 2020-03-13 ENCOUNTER — Encounter: Payer: Self-pay | Admitting: Family Medicine

## 2020-03-13 ENCOUNTER — Other Ambulatory Visit: Payer: Self-pay

## 2020-03-13 ENCOUNTER — Ambulatory Visit: Payer: Medicare Other | Admitting: Family Medicine

## 2020-03-13 VITALS — BP 129/70 | HR 97 | Ht 63.0 in | Wt 205.0 lb

## 2020-03-13 DIAGNOSIS — M1711 Unilateral primary osteoarthritis, right knee: Secondary | ICD-10-CM | POA: Insufficient documentation

## 2020-03-13 IMAGING — DX DG KNEE COMPLETE 4+V*R*
3 series · 3 of 3 positions shown · non-contrast
Comparison: None.

CLINICAL DATA: Chronic right knee pain.  No reported injury.

EXAM:
RIGHT KNEE - COMPLETE 4+ VIEW

[knee ap]
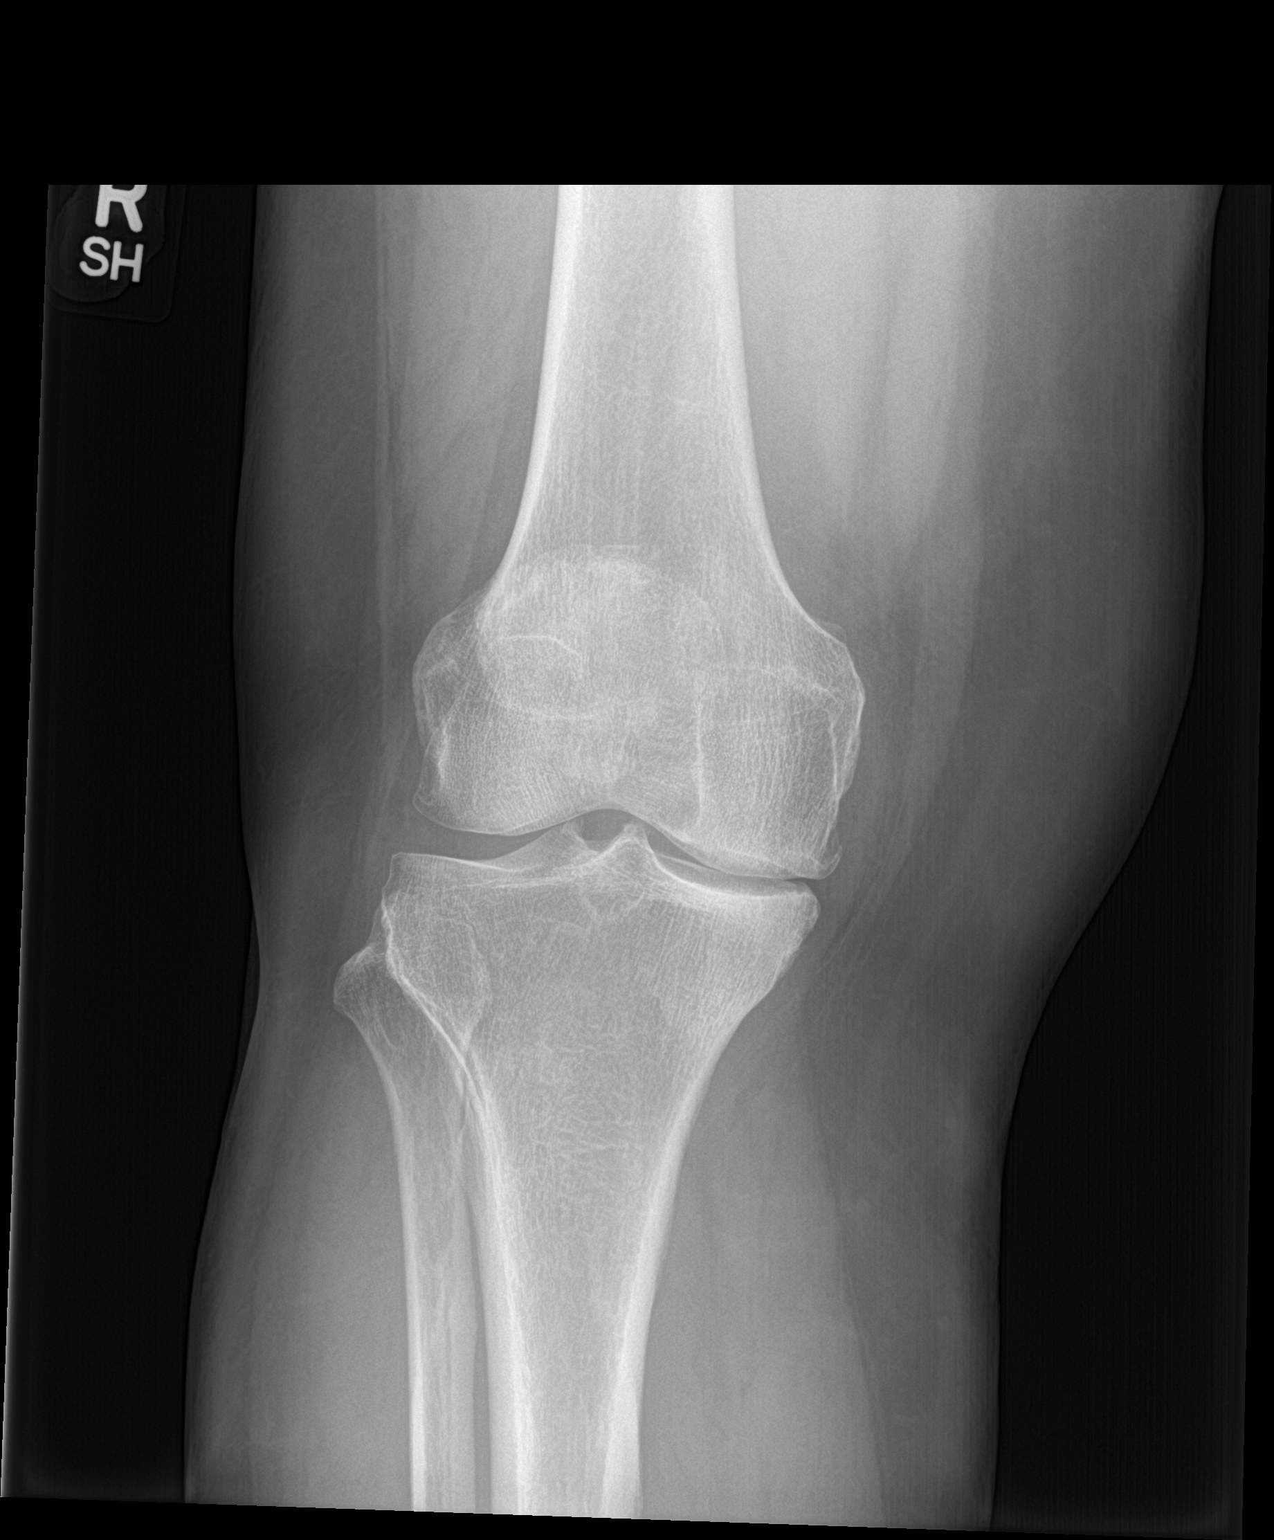

[knee lat]
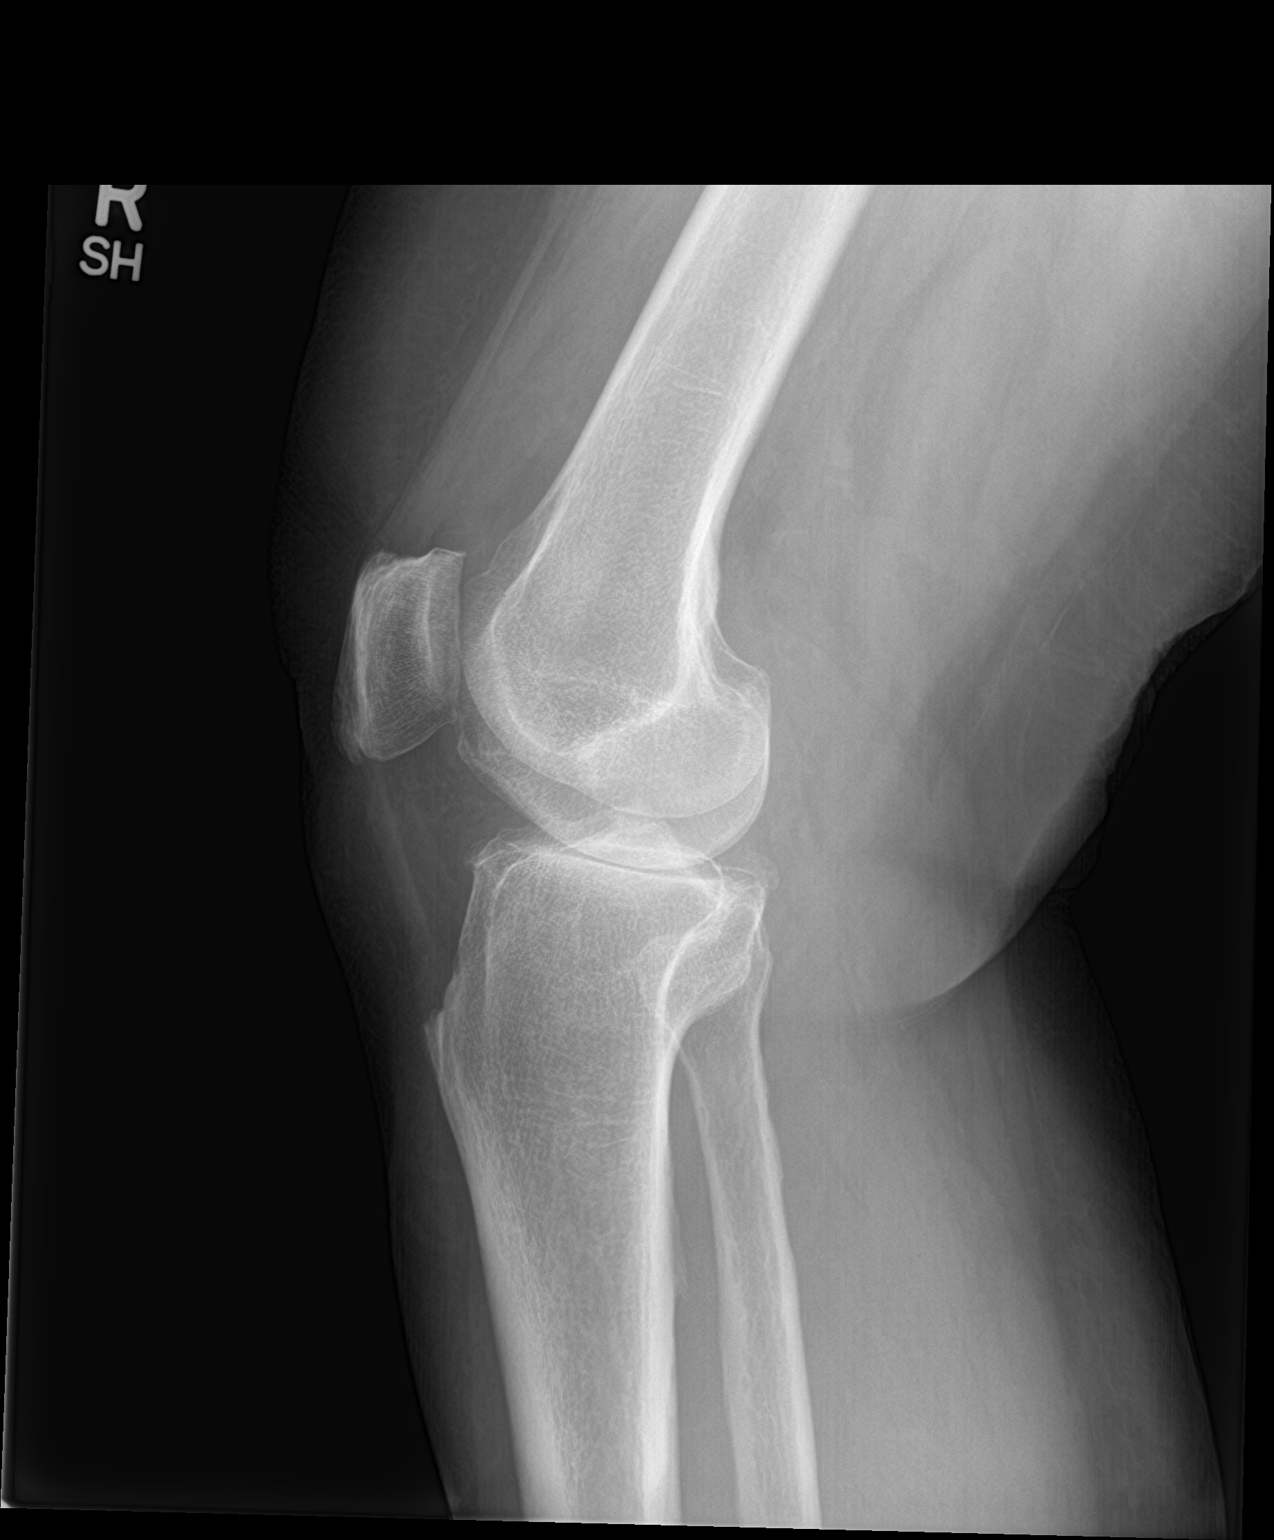

[knee sunrise]
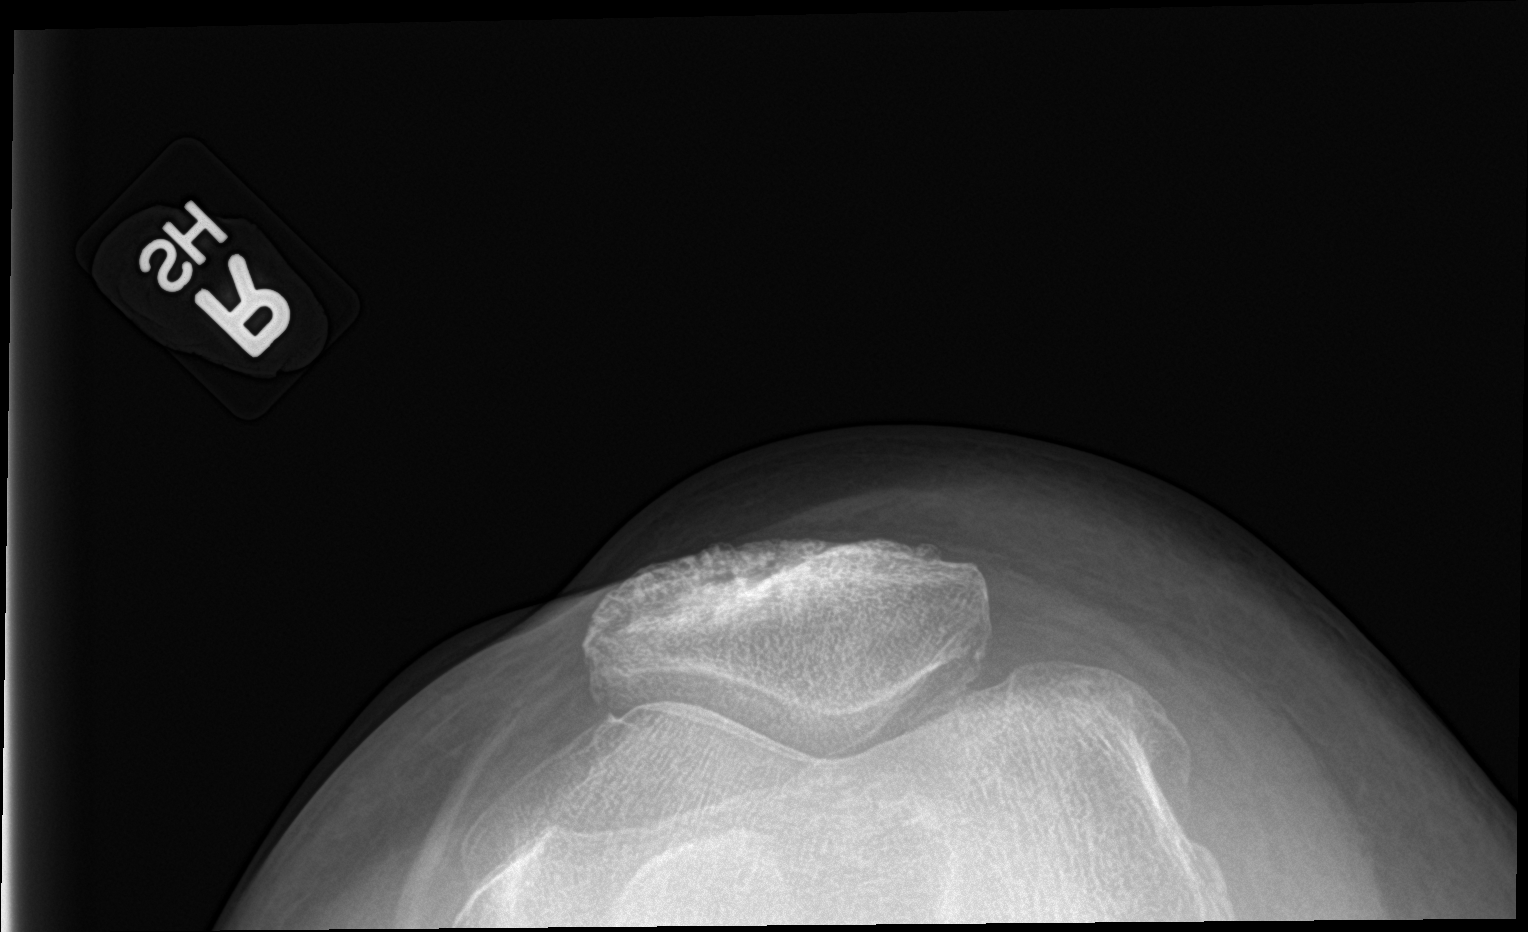

[3 of 3 positions shown; findings below may reference images not displayed]

FINDINGS: No fracture or dislocation. Trace suprapatellar right knee joint
effusion. No suspicious focal osseous lesions. Mild-to-moderate
medial compartment and mild patellofemoral compartment
osteoarthritis. Preserved lateral compartment. Small inferior right
patellar enthesophyte. No radiopaque foreign bodies.
IMPRESSION: 1. Mild-to-moderate medial and mild patellofemoral compartment right
knee osteoarthritis.
2. Trace suprapatellar right knee joint effusion.

## 2020-03-13 NOTE — Progress Notes (Signed)
Heather Pierce - 69 y.o. female MRN [id number]  Date of birth: Jan 07, 1951  SUBJECTIVE:  Including CC & ROS.  Chief Complaint  Patient presents with  . Follow-up    right knee    Heather Pierce is a 69 y.o. female that is presenting with worsening of her right knee pain.  We have tried steroid injections as well as partial weightbearing and still having ongoing pain.  She usually walks for exercise and cannot do so given the pain.   Review of Systems See HPI   HISTORY: Past Medical, Surgical, Social, and Family History Reviewed & Updated per EMR.   Pertinent Historical Findings include:  Past Medical History:  Diagnosis Date  . Allergy   . GERD (gastroesophageal reflux disease)   . Hyperlipidemia   . Hypertension     Past Surgical History:  Procedure Laterality Date  . KNEE ARTHROSCOPY  11/2009   left     Family History  Problem Relation Age of Onset  . Jaundice Father   . GER disease Father   . COPD Father   . Dementia Father   . Osteoarthritis Father        In nursing home  . Cancer Father        melanoma  . Alzheimer's disease Father   . Colon polyps Father   . Alzheimer's disease Mother   . Dementia Mother   . Arthritis Other   . Hyperlipidemia Other   . Hypertension Other   . Coronary artery disease Other        1st degree relative @60s   . Melanoma Other   . Colon cancer Maternal Grandmother 33    Social History   Socioeconomic History  . Marital status: Single    Spouse name: Not on file  . Number of children: Not on file  . Years of education: Not on file  . Highest education level: Not on file  Occupational History  . Occupation: retired    Comment: [employer]    Employer: [employer]  Tobacco Use  . Smoking status: Never Smoker  . Smokeless tobacco: Never Used  Substance and Sexual Activity  . Alcohol use: Yes    Comment: occ. maybe 2 drinks per month per pt.  . Drug use: No  . Sexual activity: Never    Partners: Male    Birth  control/protection: None  Other Topics Concern  . Not on file  Social History Narrative   Exercise-- walking dog   Social Determinants of Health   Financial Resource Strain:   . Difficulty of Paying Living Expenses: Not on file  Food Insecurity:   . Worried About Charity fundraiser in the Last Year: Not on file  . Ran Out of Food in the Last Year: Not on file  Transportation Needs:   . Lack of Transportation (Medical): Not on file  . Lack of Transportation (Non-Medical): Not on file  Physical Activity:   . Days of Exercise per Week: Not on file  . Minutes of Exercise per Session: Not on file  Stress:   . Feeling of Stress : Not on file  Social Connections:   . Frequency of Communication with Friends and Family: Not on file  . Frequency of Social Gatherings with Friends and Family: Not on file  . Attends Religious Services: Not on file  . Active Member of Clubs or Organizations: Not on file  . Attends Archivist Meetings: Not on file  . Marital Status: Not  on file  Intimate Partner Violence:   . Fear of Current or Ex-Partner: Not on file  . Emotionally Abused: Not on file  . Physically Abused: Not on file  . Sexually Abused: Not on file     PHYSICAL EXAM:  VS: BP 129/70   Pulse 97   Ht 5\' 3"  (1.6 m)   Wt 205 lb (93 kg)   LMP 09/24/2005   BMI 36.31 kg/m  Physical Exam Gen: NAD, alert, cooperative with exam, well-appearing MSK:  Right knee: Mild effusion. Normal range of motion. Tenderness to palpation over the medial and lateral joint space. No instability. Positive McMurray's test. Neurovascularly intact     ASSESSMENT & PLAN:   Primary osteoarthritis of right knee Pain is been ongoing despite treatment.  Concern for possible occult fracture versus internal derangement given the pain has persisted with steroid injections. -Counseled on partial weightbearing. -X-ray -MRI to evaluate for occult fracture and internal derangement.

## 2020-03-13 NOTE — Patient Instructions (Signed)
Good to see you Please continue heat  I will call with the xray results.  Please send me a message in MyChart with any questions or updates.  We will schedule a virtual visit once the MRI is resulted.   --Dr. Raeford Razor

## 2020-03-14 ENCOUNTER — Ambulatory Visit (INDEPENDENT_AMBULATORY_CARE_PROVIDER_SITE_OTHER): Payer: Medicare Other

## 2020-03-14 ENCOUNTER — Telehealth: Payer: Self-pay | Admitting: Family Medicine

## 2020-03-14 DIAGNOSIS — M1711 Unilateral primary osteoarthritis, right knee: Secondary | ICD-10-CM | POA: Diagnosis not present

## 2020-03-14 IMAGING — MR MR KNEE*R* W/O CM
7 series · 40 of 40 positions shown · non-contrast
Comparison: Right knee x-rays dated [DATE]. MRI right
knee report dated [DATE].

CLINICAL DATA: Chronic right knee pain. Prior meniscus repair in
[2I].

EXAM:
MRI OF THE RIGHT KNEE WITHOUT CONTRAST
TECHNIQUE: Multiplanar, multisequence MR imaging of the knee was performed. No
intravenous contrast was administered.

[Series 4: T1 · coronal · 4.0mm · 0.62mm/px · 6 of 31 slices shown]
[im 1/31]
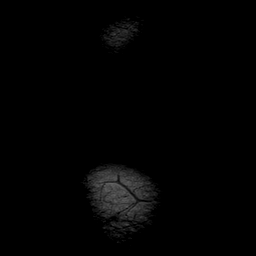
[im 7/31]
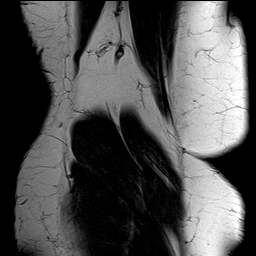
[im 13/31]
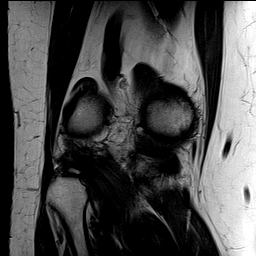
[im 19/31]
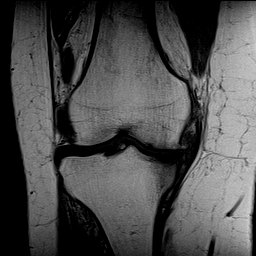
[im 25/31]
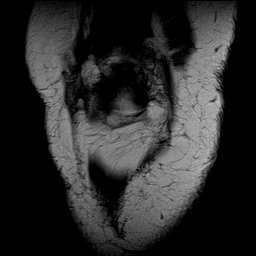
[im 31/31]
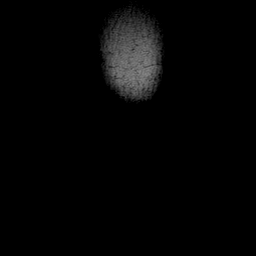

[Series 5: T2 fat-sat · coronal · 4.0mm · 0.62mm/px · 6 of 31 slices shown (1 of 3)]
[im 1/31]
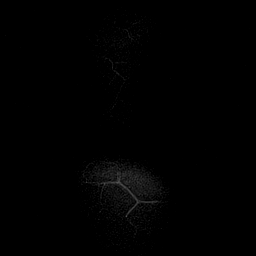
[im 7/31]
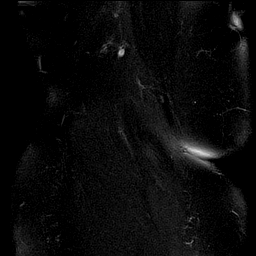
[im 13/31]
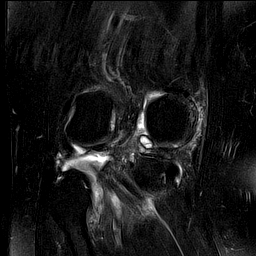
[im 19/31]
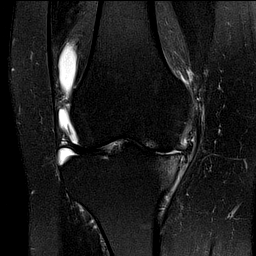
[im 25/31]
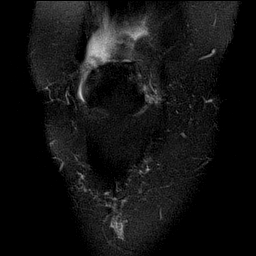
[im 31/31]
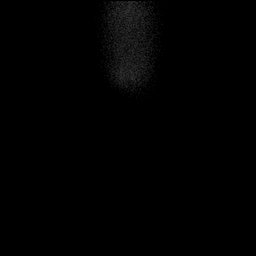

[Series 6: PD fat-sat · coronal · 4.0mm · 0.62mm/px · 6 of 31 slices shown (1 of 3)]
[im 1/31]
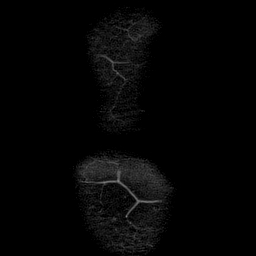
[im 7/31]
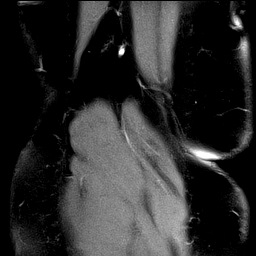
[im 13/31]
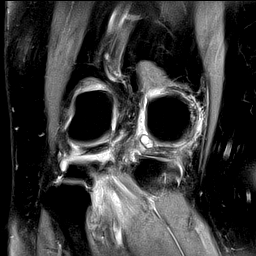
[im 19/31]
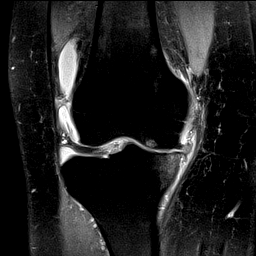
[im 25/31]
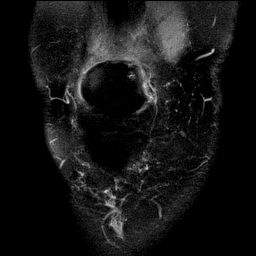
[im 31/31]
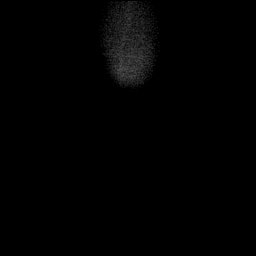

[Series 7: PD fat-sat · sagittal · 3.0mm · 0.62mm/px · 6 of 35 slices shown (2 of 3)]
[im 1/35]
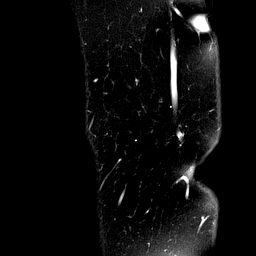
[im 7/35]
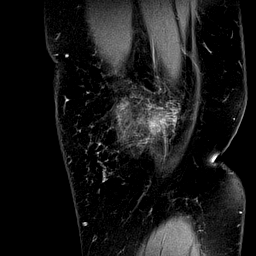
[im 14/35]
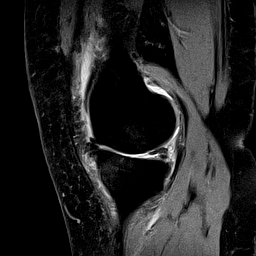
[im 21/35]
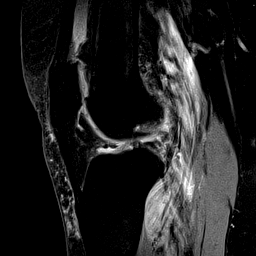
[im 28/35]
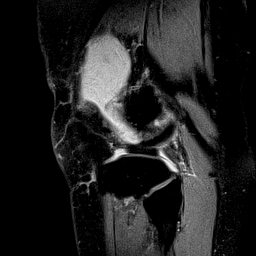
[im 35/35]
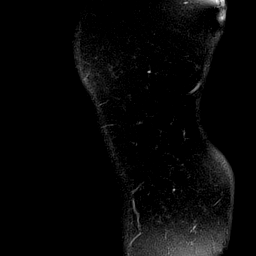

[Series 8: T2 fat-sat · sagittal · 3.0mm · 0.62mm/px · 6 of 35 slices shown (2 of 3)]
[im 1/35]
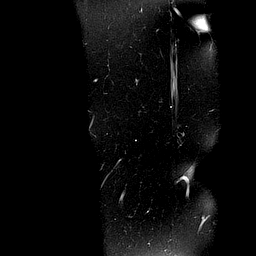
[im 7/35]
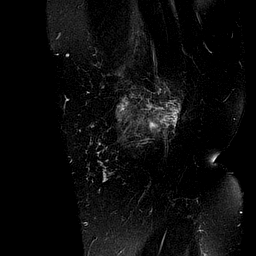
[im 14/35]
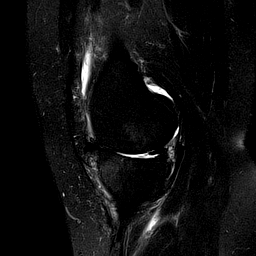
[im 21/35]
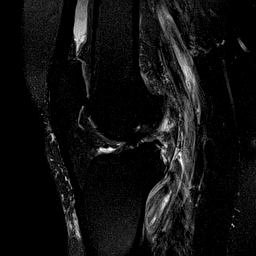
[im 28/35]
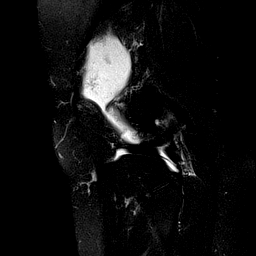
[im 35/35]
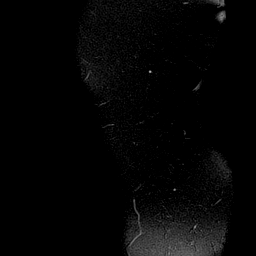

[Series 9: PD fat-sat · oblique · 2.0mm · 0.62mm/px · 4 of 19 slices shown (3 of 3)]
[im 1/19]
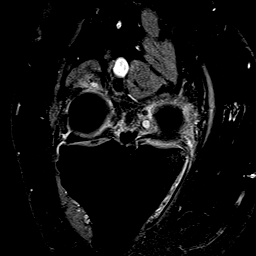
[im 7/19]
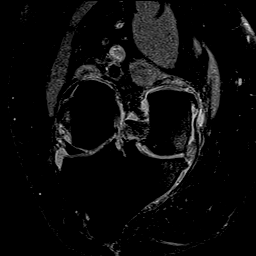
[im 13/19]
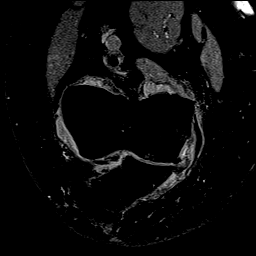
[im 19/19]
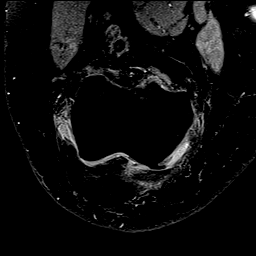

[Series 10: T2 fat-sat · axial · 4.0mm · 0.53mm/px · z∈[-93,+76]mm · 6 of 35 slices shown (3 of 3)]
[im 1/35]
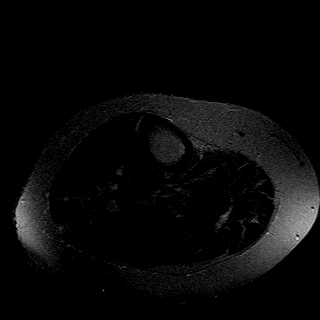
[im 7/35]
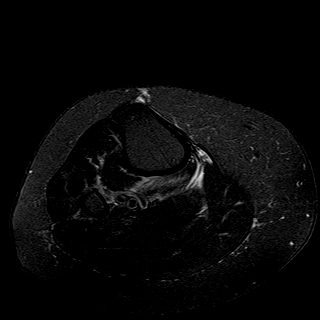
[im 14/35]
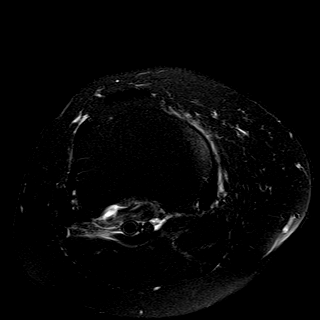
[im 21/35]
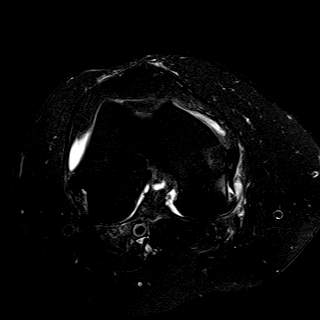
[im 28/35]
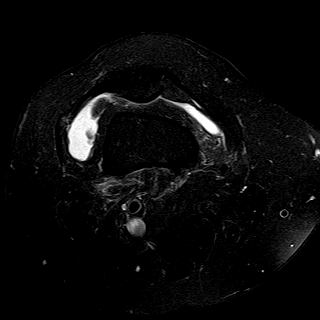
[im 35/35]
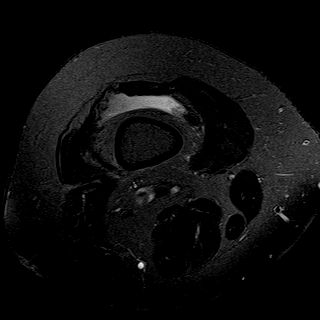

[40 of 40 positions shown; findings below may reference images not displayed]

FINDINGS: MENISCI

Medial meniscus: Large horizontal tear of the posterior horn with
0.7 x 1.6 x 1.1 cm parameniscal cyst. The tear extends into the
midbody which is extensively degenerated, partially macerated, and
extruded.

Lateral meniscus:  Intact.

LIGAMENTS

Cruciates:  Intact ACL and PCL.

Collaterals: Medial collateral ligament is intact. Lateral
collateral ligament complex is intact.

CARTILAGE

Patellofemoral:  Mild partial-thickness cartilage loss.

Medial: Large areas of full-thickness cartilage loss over the
central weight-bearing medial femoral condyle and medial tibial
plateau with degenerative subchondral marrow edema.

Lateral:  Mild partial-thickness cartilage loss.

Joint:  Small joint effusion.  Normal Hoffa's fat.

Popliteal Fossa:  No Baker cyst. Intact popliteus tendon.

Extensor Mechanism: Intact quadriceps tendon and patellar tendon.
Intact medial and lateral patellar retinaculum. Intact MPFL.

Bones: No acute fracture or dislocation. No suspicious bone lesion.
Tricompartmental marginal osteophytes.

Other: None.
IMPRESSION: 1. Large horizontal tear of the medial meniscus posterior horn with
parameniscal cyst. The tear extends into the midbody which is
extensively degenerated, partially macerated, and extruded.
2. Tricompartmental osteoarthritis, moderate to severe in the medial
compartment.

## 2020-03-14 NOTE — Telephone Encounter (Signed)
Called pt to schedule MRI review virtual appt w/ provider--left msg to call back.  --glh

## 2020-03-14 NOTE — Telephone Encounter (Signed)
Informed of xray results.   Rosemarie Ax, MD [facility] 03/14/2020, 4:30 PM

## 2020-03-14 NOTE — Assessment & Plan Note (Signed)
Pain is been ongoing despite treatment.  Concern for possible occult fracture versus internal derangement given the pain has persisted with steroid injections. -Counseled on partial weightbearing. -X-ray -MRI to evaluate for occult fracture and internal derangement.

## 2020-03-16 ENCOUNTER — Other Ambulatory Visit: Payer: Self-pay

## 2020-03-16 ENCOUNTER — Telehealth (INDEPENDENT_AMBULATORY_CARE_PROVIDER_SITE_OTHER): Payer: Medicare Other | Admitting: Family Medicine

## 2020-03-16 DIAGNOSIS — M1711 Unilateral primary osteoarthritis, right knee: Secondary | ICD-10-CM

## 2020-03-16 NOTE — Assessment & Plan Note (Signed)
MRI was revealing for a large meniscal tear as well as moderate to severe arthritic changes.  Pain is improved some. -Counseled on home exercise therapy and supportive care. -Consider physical therapy. -Pursue gel injections.

## 2020-03-16 NOTE — Progress Notes (Signed)
Virtual Visit via Video Note  I connected with Seward Speck on 03/16/20 at  1:30 PM EDT by a video enabled telemedicine application and verified that I am speaking with the correct person using two identifiers.   I discussed the limitations of evaluation and management by telemedicine and the availability of in person appointments. The patient expressed understanding and agreed to proceed.  Patient: home  Physician: office   History of Present Illness:  Ms. Maqueda is a 69 yo F that is following up after her MRI of her right knee. Pain has improved. MRi was revealing for large horizontal tear of the medial meniscus posterior horn with a parameniscal cyst.  Also showing tricompartmental osteoarthritis that is moderate to severe.   Observations/Objective: Gen: NAD, alert, cooperative with exam, well-appearing   Assessment and Plan:  Primary osteoarthritis of right knee: MRI was revealing for a large meniscal tear as well as moderate to severe arthritic changes.  Pain is improved some. -Counseled on home exercise therapy and supportive care. -Consider physical therapy. -Pursue gel injections.  Follow Up Instructions:    I discussed the assessment and treatment plan with the patient. The patient was provided an opportunity to ask questions and all were answered. The patient agreed with the plan and demonstrated an understanding of the instructions.   The patient was advised to call back or seek an in-person evaluation if the symptoms worsen or if the condition fails to improve as anticipated.   Clearance Coots, MD

## 2020-03-17 ENCOUNTER — Encounter: Payer: Self-pay | Admitting: Family Medicine

## 2020-03-28 ENCOUNTER — Encounter: Payer: Self-pay | Admitting: Family Medicine

## 2020-03-28 ENCOUNTER — Ambulatory Visit (INDEPENDENT_AMBULATORY_CARE_PROVIDER_SITE_OTHER): Payer: Medicare Other | Admitting: Family Medicine

## 2020-03-28 ENCOUNTER — Other Ambulatory Visit: Payer: Self-pay

## 2020-03-28 VITALS — BP 128/83 | HR 93 | Temp 98.6°F | Resp 18 | Ht 63.0 in | Wt 205.6 lb

## 2020-03-28 DIAGNOSIS — I10 Essential (primary) hypertension: Secondary | ICD-10-CM

## 2020-03-28 DIAGNOSIS — Z23 Encounter for immunization: Secondary | ICD-10-CM

## 2020-03-28 DIAGNOSIS — Z0000 Encounter for general adult medical examination without abnormal findings: Secondary | ICD-10-CM

## 2020-03-28 DIAGNOSIS — E785 Hyperlipidemia, unspecified: Secondary | ICD-10-CM

## 2020-03-28 NOTE — Patient Instructions (Signed)
Preventive Care 69 Years and Older, Female Preventive care refers to lifestyle choices and visits with your health care provider that can promote health and wellness. This includes:  A yearly physical exam. This is also called an annual well check.  Regular dental and eye exams.  Immunizations.  Screening for certain conditions.  Healthy lifestyle choices, such as diet and exercise. What can I expect for my preventive care visit? Physical exam Your health care provider will check:  Height and weight. These may be used to calculate body mass index (BMI), which is a measurement that tells if you are at a healthy weight.  Heart rate and blood pressure.  Your skin for abnormal spots. Counseling Your health care provider may ask you questions about:  Alcohol, tobacco, and drug use.  Emotional well-being.  Home and relationship well-being.  Sexual activity.  Eating habits.  History of falls.  Memory and ability to understand (cognition).  Work and work Statistician.  Pregnancy and menstrual history. What immunizations do I need?  Influenza (flu) vaccine  This is recommended every year. Tetanus, diphtheria, and pertussis (Tdap) vaccine  You may need a Td booster every 10 years. Varicella (chickenpox) vaccine  You may need this vaccine if you have not already been vaccinated. Zoster (shingles) vaccine  You may need this after age 33. Pneumococcal conjugate (PCV13) vaccine  One dose is recommended after age 33. Pneumococcal polysaccharide (PPSV23) vaccine  One dose is recommended after age 72. Measles, mumps, and rubella (MMR) vaccine  You may need at least one dose of MMR if you were born in 1957 or later. You may also need a second dose. Meningococcal conjugate (MenACWY) vaccine  You may need this if you have certain conditions. Hepatitis A vaccine  You may need this if you have certain conditions or if you travel or work in places where you may be exposed  to hepatitis A. Hepatitis B vaccine  You may need this if you have certain conditions or if you travel or work in places where you may be exposed to hepatitis B. Haemophilus influenzae type b (Hib) vaccine  You may need this if you have certain conditions. You may receive vaccines as individual doses or as more than one vaccine together in one shot (combination vaccines). Talk with your health care provider about the risks and benefits of combination vaccines. What tests do I need? Blood tests  Lipid and cholesterol levels. These may be checked every 5 years, or more frequently depending on your overall health.  Hepatitis C test.  Hepatitis B test. Screening  Lung cancer screening. You may have this screening every year starting at age 39 if you have a 30-pack-year history of smoking and currently smoke or have quit within the past 15 years.  Colorectal cancer screening. All adults should have this screening starting at age 36 and continuing until age 15. Your health care provider may recommend screening at age 23 if you are at increased risk. You will have tests every 1-10 years, depending on your results and the type of screening test.  Diabetes screening. This is done by checking your blood sugar (glucose) after you have not eaten for a while (fasting). You may have this done every 1-3 years.  Mammogram. This may be done every 1-2 years. Talk with your health care provider about how often you should have regular mammograms.  BRCA-related cancer screening. This may be done if you have a family history of breast, ovarian, tubal, or peritoneal cancers.  Other tests  Sexually transmitted disease (STD) testing.  Bone density scan. This is done to screen for osteoporosis. You may have this done starting at age 44. Follow these instructions at home: Eating and drinking  Eat a diet that includes fresh fruits and vegetables, whole grains, lean protein, and low-fat dairy products. Limit  your intake of foods with high amounts of sugar, saturated fats, and salt.  Take vitamin and mineral supplements as recommended by your health care provider.  Do not drink alcohol if your health care provider tells you not to drink.  If you drink alcohol: ? Limit how much you have to 0-1 drink a day. ? Be aware of how much alcohol is in your drink. In the [place] one drink equals one 12 oz bottle of beer (355 mL), one 5 oz glass of wine (148 mL), or one 1 oz glass of hard liquor (44 mL). Lifestyle  Take daily care of your teeth and gums.  Stay active. Exercise for at least 30 minutes on 5 or more days each week.  Do not use any products that contain nicotine or tobacco, such as cigarettes, e-cigarettes, and chewing tobacco. If you need help quitting, ask your health care provider.  If you are sexually active, practice safe sex. Use a condom or other form of protection in order to prevent STIs (sexually transmitted infections).  Talk with your health care provider about taking a low-dose aspirin or statin. What's next?  Go to your health care provider once a year for a well check visit.  Ask your health care provider how often you should have your eyes and teeth checked.  Stay up to date on all vaccines. This information is not intended to replace advice given to you by your health care provider. Make sure you discuss any questions you have with your health care provider. Document Revised: 06/11/2018 Document Reviewed: 06/11/2018 Elsevier Patient Education  2020 [employer].

## 2020-03-28 NOTE — Progress Notes (Signed)
Subjective:     Heather Pierce is a 69 y.o. female and is here for a comprehensive physical exam. The patient reports problems - r knee pain ---  Medial meniscal tear .  She is seeing sport med for this   No other compliants Will f/u on bp and cholesterol too     Social History   Socioeconomic History  . Marital status: Single    Spouse name: Not on file  . Number of children: Not on file  . Years of education: Not on file  . Highest education level: Not on file  Occupational History  . Occupation: retired    Comment: [employer]    Employer: [employer]  Tobacco Use  . Smoking status: Never Smoker  . Smokeless tobacco: Never Used  Substance and Sexual Activity  . Alcohol use: Yes    Comment: occ. maybe 2 drinks per month per pt.  . Drug use: No  . Sexual activity: Never    Partners: Male    Birth control/protection: None  Other Topics Concern  . Not on file  Social History Narrative   Exercise-- walking dog   Social Determinants of Health   Financial Resource Strain:   . Difficulty of Paying Living Expenses: Not on file  Food Insecurity:   . Worried About Charity fundraiser in the Last Year: Not on file  . Ran Out of Food in the Last Year: Not on file  Transportation Needs:   . Lack of Transportation (Medical): Not on file  . Lack of Transportation (Non-Medical): Not on file  Physical Activity:   . Days of Exercise per Week: Not on file  . Minutes of Exercise per Session: Not on file  Stress:   . Feeling of Stress : Not on file  Social Connections:   . Frequency of Communication with Friends and Family: Not on file  . Frequency of Social Gatherings with Friends and Family: Not on file  . Attends Religious Services: Not on file  . Active Member of Clubs or Organizations: Not on file  . Attends Archivist Meetings: Not on file  . Marital Status: Not on file  Intimate Partner Violence:   . Fear of Current or Ex-Partner: Not on file  . Emotionally  Abused: Not on file  . Physically Abused: Not on file  . Sexually Abused: Not on file   Health Maintenance  Topic Date Due  . TETANUS/TDAP  04/19/2019  . MAMMOGRAM  07/11/2020  . DEXA SCAN  07/11/2021  . COLONOSCOPY  01/16/2023  . INFLUENZA VACCINE  Completed  . COVID-19 Vaccine  Completed  . Hepatitis C Screening  Completed  . PNA vac Low Risk Adult  Completed    The following portions of the patient's history were reviewed and updated as appropriate:  She  has a past medical history of Allergy, GERD (gastroesophageal reflux disease), Hyperlipidemia, and Hypertension. She does not have any pertinent problems on file. She  has a past surgical history that includes Knee arthroscopy (11/2009). Her family history includes Alzheimer's disease in her father and mother; Arthritis in an other family member; COPD in her father; Cancer in her father; Colon cancer (age of onset: 53) in her maternal grandmother; Colon polyps in her father; Coronary artery disease in an other family member; Dementia in her father and mother; GER disease in her father; Hyperlipidemia in an other family member; Hypertension in an other family member; Jaundice in her father; Melanoma in an other  family member; Osteoarthritis in her father. She  reports that she has never smoked. She has never used smokeless tobacco. She reports current alcohol use. She reports that she does not use drugs. She has a current medication list which includes the following prescription(s): lisinopril-hydrochlorothiazide, fish oil, and simvastatin. Current Outpatient Medications on File Prior to Visit  Medication Sig Dispense Refill  . lisinopril-hydrochlorothiazide (ZESTORETIC) 20-25 MG tablet TAKE 1 TABLET BY MOUTH EVERY DAY 90 tablet 0  . Omega-3 Fatty Acids (FISH OIL) 1000 MG CPDR Take 1 capsule by mouth daily.    . simvastatin (ZOCOR) 40 MG tablet TAKE 1 TABLET BY MOUTH EVERY DAY 90 tablet 3   No current facility-administered medications  on file prior to visit.   She has No Known Allergies..  Review of Systems Review of Systems  Constitutional: Negative for activity change, appetite change and fatigue.  HENT: Negative for hearing loss, congestion, tinnitus and ear discharge.  dentist q72m Eyes: Negative for visual disturbance (see optho q1y -- vision corrected to 20/20 with glasses).  Respiratory: Negative for cough, chest tightness and shortness of breath.   Cardiovascular: Negative for chest pain, palpitations and leg swelling.  Gastrointestinal: Negative for abdominal pain, diarrhea, constipation and abdominal distention.  Genitourinary: Negative for urgency, frequency, decreased urine volume and difficulty urinating.  Musculoskeletal: Negative for back pain, + R knee pain .  Skin: Negative for color change, pallor and rash.  Neurological: Negative for dizziness, light-headedness, numbness and headaches.  Hematological: Negative for adenopathy. Does not bruise/bleed easily.  Psychiatric/Behavioral: Negative for suicidal ideas, confusion, sleep disturbance, self-injury, dysphoric mood, decreased concentration and agitation.       Objective:    BP 128/83 (BP Location: Right Arm, Patient Position: Sitting, Cuff Size: Large)   Pulse 93   Temp 98.6 F (37 C) (Oral)   Resp 18   Ht 5\' 3"  (1.6 m)   Wt 205 lb 9.6 oz (93.3 kg)   LMP 09/24/2005   SpO2 98%   BMI 36.42 kg/m  General appearance: alert, cooperative, appears stated age and no distress Head: Normocephalic, without obvious abnormality, atraumatic Eyes: negative findings: lids and lashes normal, conjunctivae and sclerae normal and pupils equal, round, reactive to light and accomodation Ears: normal TM's and external ear canals both ears Neck: no adenopathy, no carotid bruit, no JVD, supple, symmetrical, trachea midline and thyroid not enlarged, symmetric, no tenderness/mass/nodules Back: symmetric, no curvature. ROM normal. No CVA tenderness. Lungs: clear to  auscultation bilaterally Breasts: normal appearance, no masses or tenderness Heart: regular rate and rhythm, S1, S2 normal, no murmur, click, rub or gallop Abdomen: soft, non-tender; bowel sounds normal; no masses,  no organomegaly Pelvic: not indicated; post-menopausal, no abnormal Pap smears in past Extremities: extremities normal, atraumatic, no cyanosis or edema Pulses: 2+ and symmetric Skin: Skin color, texture, turgor normal. No rashes or lesions Lymph nodes: Cervical, supraclavicular, and axillary nodes normal. Neurologic: Alert and oriented X 3, normal strength and tone. Normal symmetric reflexes. Normal coordination and gait    Assessment:    Healthy female exam.     Plan:    ghm utd Check labs  See After Visit Summary for Counseling Recommendations    1. Need for influenza vaccination   - Flu Vaccine QUAD High Dose(Fluad)  2. Hyperlipidemia, unspecified hyperlipidemia type Encouraged heart healthy diet, increase exercise, avoid trans fats, consider a krill oil cap daily - Lipid panel - Comprehensive metabolic panel  3. Essential hypertension Well controlled, no changes to meds. Encouraged heart  healthy diet such as the DASH diet and exercise as tolerated.  - Lipid panel - Comprehensive metabolic panel  4. Preventative health care See above

## 2020-03-29 ENCOUNTER — Encounter: Payer: Self-pay | Admitting: Family Medicine

## 2020-03-29 LAB — COMPREHENSIVE METABOLIC PANEL
AG Ratio: 2.1 (calc) (ref 1.0–2.5)
ALT: 19 U/L (ref 6–29)
AST: 19 U/L (ref 10–35)
Albumin: 4.5 g/dL (ref 3.6–5.1)
Alkaline phosphatase (APISO): 64 U/L (ref 37–153)
BUN: 16 mg/dL (ref 7–25)
CO2: 31 mmol/L (ref 20–32)
Calcium: 9.6 mg/dL (ref 8.6–10.4)
Chloride: 99 mmol/L (ref 98–110)
Creat: 0.96 mg/dL (ref 0.50–0.99)
Globulin: 2.1 g/dL (calc) (ref 1.9–3.7)
Glucose, Bld: 98 mg/dL (ref 65–99)
Potassium: 3.9 mmol/L (ref 3.5–5.3)
Sodium: 138 mmol/L (ref 135–146)
Total Bilirubin: 0.5 mg/dL (ref 0.2–1.2)
Total Protein: 6.6 g/dL (ref 6.1–8.1)

## 2020-03-29 LAB — LIPID PANEL
Cholesterol: 159 mg/dL (ref <200)
HDL: 55 mg/dL (ref >=50)
LDL Cholesterol (Calc): 78 mg/dL (calc)
Non-HDL Cholesterol (Calc): 104 mg/dL (calc) (ref <130)
Total CHOL/HDL Ratio: 2.9 (calc) (ref <5.0)
Triglycerides: 161 mg/dL — ABNORMAL HIGH (ref <150)

## 2020-03-29 NOTE — Assessment & Plan Note (Signed)
Tolerating statin, encouraged heart healthy diet, avoid trans fats, minimize simple carbs and saturated fats. Increase exercise as tolerated con't zocor 

## 2020-03-29 NOTE — Assessment & Plan Note (Signed)
Well controlled, no changes to meds. Encouraged heart healthy diet such as the DASH diet and exercise as tolerated.  °con't lisinopril / hct  °

## 2020-03-31 ENCOUNTER — Encounter: Payer: Medicare Other | Admitting: Family Medicine

## 2020-04-03 ENCOUNTER — Ambulatory Visit: Payer: Medicare Other | Attending: Internal Medicine

## 2020-04-03 DIAGNOSIS — Z23 Encounter for immunization: Secondary | ICD-10-CM

## 2020-04-03 NOTE — Progress Notes (Signed)
   Covid-19 Vaccination Clinic  Name:  Heather Pierce    MRN: [id number] DOB: [birth year]  04/03/2020  Ms. Heather Pierce was observed post Covid-19 immunization for 15 minutes without incident. She was provided with Vaccine Information Sheet and instruction to access the V-Safe system. Vaccinated by Harriet Pho.  Ms. Heather Pierce was instructed to call 911 with any severe reactions post vaccine: Marland Kitchen Difficulty breathing  . Swelling of face and throat  . A fast heartbeat  . A bad rash all over body  . Dizziness and weakness

## 2020-04-04 ENCOUNTER — Other Ambulatory Visit: Payer: Self-pay

## 2020-04-04 ENCOUNTER — Ambulatory Visit (INDEPENDENT_AMBULATORY_CARE_PROVIDER_SITE_OTHER): Payer: Medicare Other | Admitting: Family Medicine

## 2020-04-04 ENCOUNTER — Ambulatory Visit: Payer: Self-pay

## 2020-04-04 DIAGNOSIS — M1711 Unilateral primary osteoarthritis, right knee: Secondary | ICD-10-CM

## 2020-04-04 NOTE — Patient Instructions (Signed)
Good to see you Please try ice   Please send me a message in MyChart with any questions or updates.  Please see me back in 4-6 weeks.   --Dr. Raeford Razor

## 2020-04-04 NOTE — Progress Notes (Signed)
Heather Pierce - 69 y.o. female MRN [id number]  Date of birth: [birth year]  SUBJECTIVE:  Including CC & ROS.  No chief complaint on file.   Heather Pierce is a 69 y.o. female that is presenting for a gel injection.    Review of Systems See HPI   HISTORY: Past Medical, Surgical, Social, and Family History Reviewed & Updated per EMR.   Pertinent Historical Findings include:  Past Medical History:  Diagnosis Date  . Allergy   . GERD (gastroesophageal reflux disease)   . Hyperlipidemia   . Hypertension     Past Surgical History:  Procedure Laterality Date  . KNEE ARTHROSCOPY  11/2009   left     Family History  Problem Relation Age of Onset  . Jaundice Father   . GER disease Father   . COPD Father   . Dementia Father   . Osteoarthritis Father        In nursing home  . Cancer Father        melanoma  . Alzheimer's disease Father   . Colon polyps Father   . Alzheimer's disease Mother   . Dementia Mother   . Arthritis Other   . Hyperlipidemia Other   . Hypertension Other   . Coronary artery disease Other        1st degree relative @60s   . Melanoma Other   . Colon cancer Maternal Grandmother 75    Social History   Socioeconomic History  . Marital status: Single    Spouse name: Not on file  . Number of children: Not on file  . Years of education: Not on file  . Highest education level: Not on file  Occupational History  . Occupation: retired    Comment: [employer]    Employer: [employer]  Tobacco Use  . Smoking status: Never Smoker  . Smokeless tobacco: Never Used  Substance and Sexual Activity  . Alcohol use: Yes    Comment: occ. maybe 2 drinks per month per pt.  . Drug use: No  . Sexual activity: Never    Partners: Male    Birth control/protection: None  Other Topics Concern  . Not on file  Social History Narrative   Exercise-- walking dog   Social Determinants of Health   Financial Resource Strain:   . Difficulty of Paying Living Expenses: Not  on file  Food Insecurity:   . Worried About Charity fundraiser in the Last Year: Not on file  . Ran Out of Food in the Last Year: Not on file  Transportation Needs:   . Lack of Transportation (Medical): Not on file  . Lack of Transportation (Non-Medical): Not on file  Physical Activity:   . Days of Exercise per Week: Not on file  . Minutes of Exercise per Session: Not on file  Stress:   . Feeling of Stress : Not on file  Social Connections:   . Frequency of Communication with Friends and Family: Not on file  . Frequency of Social Gatherings with Friends and Family: Not on file  . Attends Religious Services: Not on file  . Active Member of Clubs or Organizations: Not on file  . Attends Archivist Meetings: Not on file  . Marital Status: Not on file  Intimate Partner Violence:   . Fear of Current or Ex-Partner: Not on file  . Emotionally Abused: Not on file  . Physically Abused: Not on file  . Sexually Abused: Not on file  PHYSICAL EXAM:  VS: LMP 09/24/2005  Physical Exam Gen: NAD, alert, cooperative with exam, well-appearing    Aspiration/Injection Procedure Note Heather Pierce August 27, 1950  Procedure: Injection Indications: Right knee pain   Procedure Details Consent: Risks of procedure as well as the alternatives and risks of each were explained to the (patient/caregiver).  Consent for procedure obtained. Time Out: Verified patient identification, verified procedure, site/side was marked, verified correct patient position, special equipment/implants available, medications/allergies/relevent history reviewed, required imaging and test results available.  Performed.  The area was cleaned with iodine and alcohol swabs.    The right knee superior lateral suprapatellar pouch was injected using 4 cc's of 1% lidocaine with a 21 2" needle. An 18 gauge 1 1/2" needle was inserted achieve aspiration.  The syringe was switched and a 60 mg/3 mL durolane was injected.  Ultrasound was used. Images were obtained in  Long views showing the injection.    Amount of Fluid Aspirated: 97mL Character of Fluid: clear and straw colored Fluid was sent for:n/a A sterile dressing was applied.  Patient did tolerate procedure well.     ASSESSMENT & PLAN:   Primary osteoarthritis of right knee Gel injection achieved today. -Counseled supportive care. -Could consider physical therapy.

## 2020-04-04 NOTE — Assessment & Plan Note (Signed)
Gel injection achieved today. -Counseled supportive care. -Could consider physical therapy.

## 2020-04-10 ENCOUNTER — Other Ambulatory Visit (HOSPITAL_BASED_OUTPATIENT_CLINIC_OR_DEPARTMENT_OTHER): Payer: Self-pay | Admitting: Internal Medicine

## 2020-04-10 MED FILL — PFIZER-BIONTECH COVID-19 VA: 30 | 1 days supply | Qty: 0 | Fill #0

## 2020-05-02 ENCOUNTER — Other Ambulatory Visit: Payer: Self-pay | Admitting: Family Medicine

## 2020-05-02 DIAGNOSIS — I10 Essential (primary) hypertension: Secondary | ICD-10-CM

## 2020-05-17 ENCOUNTER — Encounter: Payer: Self-pay | Admitting: Family Medicine

## 2020-05-17 ENCOUNTER — Ambulatory Visit: Payer: Medicare Other | Admitting: Family Medicine

## 2020-05-17 ENCOUNTER — Other Ambulatory Visit: Payer: Self-pay

## 2020-05-17 DIAGNOSIS — M1711 Unilateral primary osteoarthritis, right knee: Secondary | ICD-10-CM | POA: Diagnosis not present

## 2020-05-17 NOTE — Progress Notes (Signed)
Heather Pierce - 69 y.o. female MRN [id number]  Date of birth: 09-28-1950  SUBJECTIVE:  Including CC & ROS.  Chief Complaint  Patient presents with  . Follow-up    right knee    Heather Pierce is a 69 y.o. female that is following up after her gel injection for her right knee pain.   Review of Systems See HPI   HISTORY: Past Medical, Surgical, Social, and Family History Reviewed & Updated per EMR.   Pertinent Historical Findings include:  Past Medical History:  Diagnosis Date  . Allergy   . GERD (gastroesophageal reflux disease)   . Hyperlipidemia   . Hypertension     Past Surgical History:  Procedure Laterality Date  . KNEE ARTHROSCOPY  11/2009   left     Family History  Problem Relation Age of Onset  . Jaundice Father   . GER disease Father   . COPD Father   . Dementia Father   . Osteoarthritis Father        In nursing home  . Cancer Father        melanoma  . Alzheimer's disease Father   . Colon polyps Father   . Alzheimer's disease Mother   . Dementia Mother   . Arthritis Other   . Hyperlipidemia Other   . Hypertension Other   . Coronary artery disease Other        1st degree relative @60s   . Melanoma Other   . Colon cancer Maternal Grandmother 81    Social History   Socioeconomic History  . Marital status: Single    Spouse name: Not on file  . Number of children: Not on file  . Years of education: Not on file  . Highest education level: Not on file  Occupational History  . Occupation: retired    Comment: [employer]    Employer: [employer]  Tobacco Use  . Smoking status: Never Smoker  . Smokeless tobacco: Never Used  Substance and Sexual Activity  . Alcohol use: Yes    Comment: occ. maybe 2 drinks per month per pt.  . Drug use: No  . Sexual activity: Never    Partners: Male    Birth control/protection: None  Other Topics Concern  . Not on file  Social History Narrative   Exercise-- walking dog   Social Determinants of Health    Financial Resource Strain:   . Difficulty of Paying Living Expenses: Not on file  Food Insecurity:   . Worried About Charity fundraiser in the Last Year: Not on file  . Ran Out of Food in the Last Year: Not on file  Transportation Needs:   . Lack of Transportation (Medical): Not on file  . Lack of Transportation (Non-Medical): Not on file  Physical Activity:   . Days of Exercise per Week: Not on file  . Minutes of Exercise per Session: Not on file  Stress:   . Feeling of Stress : Not on file  Social Connections:   . Frequency of Communication with Friends and Family: Not on file  . Frequency of Social Gatherings with Friends and Family: Not on file  . Attends Religious Services: Not on file  . Active Member of Clubs or Organizations: Not on file  . Attends Archivist Meetings: Not on file  . Marital Status: Not on file  Intimate Partner Violence:   . Fear of Current or Ex-Partner: Not on file  . Emotionally Abused: Not on file  .  Physically Abused: Not on file  . Sexually Abused: Not on file     PHYSICAL EXAM:  VS: BP 129/75   Pulse 87   Ht 5\' 4"  (1.626 m)   Wt 205 lb (93 kg)   LMP 09/24/2005   BMI 35.19 kg/m  Physical Exam Gen: NAD, alert, cooperative with exam, well-appearing    ASSESSMENT & PLAN:   Primary osteoarthritis of right knee Doing well since gel injection. No pain today  - counseled on home exercise therapy and supportive care - could repeat gel injection or consider PT.

## 2020-05-17 NOTE — Assessment & Plan Note (Signed)
Doing well since gel injection. No pain today  - counseled on home exercise therapy and supportive care - could repeat gel injection or consider PT.

## 2020-05-18 ENCOUNTER — Ambulatory Visit: Payer: Medicare Other

## 2020-05-18 ENCOUNTER — Ambulatory Visit: Payer: Medicare Other | Admitting: *Deleted

## 2020-05-31 NOTE — Progress Notes (Signed)
Subjective:   Heather Pierce is a 69 y.o. female who presents for Medicare Annual (Subsequent) preventive examination.     Review of Systems     Cardiac Risk Factors include: advanced age (>46men, >103 women);hypertension;dyslipidemia;obesity (BMI >30kg/m2)     Objective:    Today's Vitals   06/01/20 0934  BP: 124/76  Pulse: 81  Temp: 98.3 F (36.8 C)  TempSrc: Oral  SpO2: 97%  Weight: 203 lb 9.6 oz (92.4 kg)  Height: 5\' 3"  (1.6 m)   Body mass index is 36.07 kg/m.  Advanced Directives 06/01/2020 05/17/2019 05/08/2018 06/26/2017 05/03/2016  Does Patient Have a Medical Advance Directive? Yes Yes Yes Yes No  Type of Paramedic of [place];Living will [place];Living will [name];Living will [place] -  Does patient want to make changes to medical advance directive? - No - Patient declined No - Patient declined No - Patient declined -  Copy of [place] in Chart? Yes - validated most recent copy scanned in chart (See row information) No - copy requested Yes - validated most recent copy scanned in chart (See row information) - -  Would patient like information on creating a medical advance directive? - - - - No - patient declined information;Yes - Educational materials given    Current Medications (verified) Outpatient Encounter Medications as of 06/01/2020  Medication Sig  . lisinopril-hydrochlorothiazide (ZESTORETIC) 20-25 MG tablet Take 1 tablet by mouth daily.  . simvastatin (ZOCOR) 40 MG tablet TAKE 1 TABLET BY MOUTH EVERY DAY  . [DISCONTINUED] Omega-3 Fatty Acids (FISH OIL) 1000 MG CPDR Take 1 capsule by mouth daily.   No facility-administered encounter medications on file as of 06/01/2020.    Allergies (verified) Patient has no known allergies.   History: Past Medical History:  Diagnosis Date  . Allergy   . GERD (gastroesophageal reflux disease)   .  Hyperlipidemia   . Hypertension    Past Surgical History:  Procedure Laterality Date  . KNEE ARTHROSCOPY  11/2009   left    Family History  Problem Relation Age of Onset  . Jaundice Father   . GER disease Father   . COPD Father   . Dementia Father   . Osteoarthritis Father        In nursing home  . Cancer Father        melanoma  . Alzheimer's disease Father   . Colon polyps Father   . Alzheimer's disease Mother   . Dementia Mother   . Arthritis Other   . Hyperlipidemia Other   . Hypertension Other   . Coronary artery disease Other        1st degree relative @60s   . Melanoma Other   . Colon cancer Maternal Grandmother 48   Social History   Socioeconomic History  . Marital status: Single    Spouse name: Not on file  . Number of children: Not on file  . Years of education: Not on file  . Highest education level: Not on file  Occupational History  . Occupation: retired    Comment: [employer]    Employer: [employer]  Tobacco Use  . Smoking status: Never Smoker  . Smokeless tobacco: Never Used  Substance and Sexual Activity  . Alcohol use: Yes    Comment: occ. maybe 2 drinks per month per pt.  . Drug use: No  . Sexual activity: Never    Partners: Male  Birth control/protection: None  Other Topics Concern  . Not on file  Social History Narrative   Exercise-- walking dog   Social Determinants of Health   Financial Resource Strain: Low Risk   . Difficulty of Paying Living Expenses: Not hard at all  Food Insecurity: No Food Insecurity  . Worried About Charity fundraiser in the Last Year: Never true  . Ran Out of Food in the Last Year: Never true  Transportation Needs: No Transportation Needs  . Lack of Transportation (Medical): No  . Lack of Transportation (Non-Medical): No  Physical Activity: Sufficiently Active  . Days of Exercise per Week: 7 days  . Minutes of Exercise per Session: 40 min  Stress: No Stress Concern Present  . Feeling of Stress : Not  at all  Social Connections: Socially Isolated  . Frequency of Communication with Friends and Family: More than three times a week  . Frequency of Social Gatherings with Friends and Family: More than three times a week  . Attends Religious Services: Never  . Active Member of Clubs or Organizations: No  . Attends Archivist Meetings: Never  . Marital Status: Never married    Tobacco Counseling Counseling given: Not Answered   Clinical Intake:  Pre-visit preparation completed: Yes  Pain : No/denies pain     Nutritional Status: BMI > 30  Obese Nutritional Risks: None Diabetes: No  How often do you need to have someone help you when you read instructions, pamphlets, or other written materials from your doctor or pharmacy?: 1 - Never What is the last grade level you completed in school?: College  Diabetic?No  Interpreter Needed?: No  Information entered by :: Caroleen Hamman LPN   Activities of Daily Living In your present state of health, do you have any difficulty performing the following activities: 06/01/2020  Hearing? N  Vision? N  Difficulty concentrating or making decisions? N  Walking or climbing stairs? N  Dressing or bathing? N  Doing errands, shopping? N  Preparing Food and eating ? N  Using the Toilet? N  In the past six months, have you accidently leaked urine? N  Do you have problems with loss of bowel control? N  Managing your Medications? N  Managing your Finances? N  Housekeeping or managing your Housekeeping? N  Some recent data might be hidden    Patient Care Team: Carollee Herter, Alferd Apa, DO as PCP - Draper, McCormick, NP as Nurse Practitioner (Orthopedic Surgery)  Indicate any recent Medical Services you may have received from other than [facility] providers in the past year (date may be approximate).     Assessment:   This is a routine wellness examination for [place].  Hearing/Vision screen  Hearing Screening   125Hz  250Hz  500Hz   1000Hz  2000Hz  3000Hz  4000Hz  6000Hz  8000Hz   Right ear:           Left ear:           Comments: No issues  Vision Screening Comments: Wears glasses Last eye exam-05/2020-Dr. Kathlen Pierce  Dietary issues and exercise activities discussed: Current Exercise Habits: Home exercise routine, Type of exercise: walking, Time (Minutes): 40, Frequency (Times/Week): 7, Weekly Exercise (Minutes/Week): 280, Intensity: Mild, Exercise limited by: None identified  Goals    . DIET - EAT MORE FRUITS AND VEGETABLES    . DIET - INCREASE WATER INTAKE    . Patient Stated     Increase activity by walking      Depression Screen PHQ  2/9 Scores 06/01/2020 05/17/2019 05/08/2018 05/03/2016 11/12/2012  PHQ - 2 Score 0 0 0 0 0    Fall Risk Fall Risk  06/01/2020 05/17/2019 05/08/2018 05/03/2016  Falls in the past year? 0 0 0 Yes  Number falls in past yr: 0 0 - -  Injury with Fall? 0 0 - -  Follow up Falls prevention discussed Education provided;Falls prevention discussed - -    Any stairs in or around the home? Yes  If so, are there any without handrails? No  Home free of loose throw rugs in walkways, pet beds, electrical cords, etc? Yes  Adequate lighting in your home to reduce risk of falls? Yes   ASSISTIVE DEVICES UTILIZED TO PREVENT FALLS:  Life alert? No  Use of a cane, walker or w/c? No  Grab bars in the bathroom? Yes  Shower chair or bench in shower? No  Elevated toilet seat or a handicapped toilet? No   TIMED UP AND GO:  Was the test performed? Yes .  Length of time to ambulate 10 feet: 9 sec.   Gait steady and fast without use of assistive device  Cognitive Function:No cognitive impairment noted MMSE - Mini Mental State Exam 05/03/2016  Orientation to time 5  Orientation to Place 5  Registration 3  Attention/ Calculation 5  Recall 3  Language- name 2 objects 2  Language- repeat 1  Language- follow 3 step command 3  Language- read & follow direction 1  Write a sentence 1  Copy design 1  Total  score 30        Immunizations Immunization History  Administered Date(s) Administered  . Fluad Quad(high Dose 65+) 03/05/2019, 03/28/2020  . Influenza, High Dose Seasonal PF 04/15/2017, 05/08/2018  . Influenza,inj,Quad PF,6+ Mos 05/04/2013, 03/25/2016  . Influenza-Unspecified 06/01/2011, 03/22/2014, 03/21/2015  . PFIZER SARS-COV-2 Vaccination 08/08/2019, 09/02/2019, 04/03/2020  . Pneumococcal Conjugate-13 05/03/2016  . Pneumococcal Polysaccharide-23 05/20/2017  . Td 04/18/2009  . Zoster 11/12/2012    TDAP status: Due, Education has been provided regarding the importance of this vaccine. Advised may receive this vaccine at local pharmacy or Health Dept. Aware to provide a copy of the vaccination record if obtained from local pharmacy or Health Dept. Verbalized acceptance and understanding.   Flu Vaccine status: Up to date   Pneumococcal vaccine status: Up to date   Covid-19 vaccine status: Completed vaccines  Qualifies for Shingles Vaccine? Yes   Zostavax completed Yes   Shingrix Completed?: No.    Education has been provided regarding the importance of this vaccine. Patient has been advised to call insurance company to determine out of pocket expense if they have not yet received this vaccine. Advised may also receive vaccine at local pharmacy or Health Dept. Verbalized acceptance and understanding.  Screening Tests Health Maintenance  Topic Date Due  . TETANUS/TDAP  04/19/2019  . MAMMOGRAM  07/11/2020  . DEXA SCAN  07/11/2021  . COLONOSCOPY  01/16/2023  . INFLUENZA VACCINE  Completed  . COVID-19 Vaccine  Completed  . Hepatitis C Screening  Completed  . PNA vac Low Risk Adult  Completed    Health Maintenance  Health Maintenance Due  Topic Date Due  . TETANUS/TDAP  04/19/2019    Colorectal cancer screening: Completed Colonoscopy 01/15/2013. Repeat every 10 years   Mammogram status: Completed Bilateral 07/12/2019. Repeat every year   Bone Density status: Completed  07/12/2019. Results reflect: Bone density results: NORMAL. Repeat every 2 years.  Lung Cancer Screening: (Low Dose CT Chest recommended if  Age 20-80 years, 30 pack-year currently smoking OR have quit w/in 15years.) does not qualify.     Additional Screening:  Hepatitis C Screening:Completed 04/03/2015  Vision Screening: Recommended annual ophthalmology exams for early detection of glaucoma and other disorders of the eye. Is the patient up to date with their annual eye exam?  Yes  Who is the provider or what is the name of the office in which the patient attends annual eye exams? Dr.Weaver   Dental Screening: Recommended annual dental exams for proper oral hygiene  Community Resource Referral / Chronic Care Management: CRR required this visit?  No   CCM required this visit?  No      Plan:     I have personally reviewed and noted the following in the patient's chart:   . Medical and social history . Use of alcohol, tobacco or illicit drugs  . Current medications and supplements . Functional ability and status . Nutritional status . Physical activity . Advanced directives . List of other physicians . Hospitalizations, surgeries, and ER visits in previous 12 months . Vitals . Screenings to include cognitive, depression, and falls . Referrals and appointments  In addition, I have reviewed and discussed with patient certain preventive protocols, quality metrics, and best practice recommendations. A written personalized care plan for preventive services as well as general preventive health recommendations were provided to patient.   Patient would like to access avs on my-chart.  Marta Antu, LPN   [id number]  Nurse Health Advisor  Nurse Notes: None

## 2020-06-01 ENCOUNTER — Other Ambulatory Visit: Payer: Self-pay

## 2020-06-01 ENCOUNTER — Ambulatory Visit (INDEPENDENT_AMBULATORY_CARE_PROVIDER_SITE_OTHER): Payer: Medicare Other

## 2020-06-01 VITALS — BP 124/76 | HR 81 | Temp 98.3°F | Ht 63.0 in | Wt 203.6 lb

## 2020-06-01 DIAGNOSIS — Z0000 Encounter for general adult medical examination without abnormal findings: Secondary | ICD-10-CM

## 2020-06-01 NOTE — Patient Instructions (Signed)
Heather Pierce , Thank you for taking time to come for your Medicare Wellness Visit. I appreciate your ongoing commitment to your health goals. Please review the following plan we discussed and let me know if I can assist you in the future.   Screening recommendations/referrals: Colonoscopy: Completed 01/15/2013-Due 01/16/2023 Mammogram: Completed 07/12/2019- Due 07/11/2020 Bone Density: Completed 07/12/2019-Due-07/11/2021 Recommended yearly ophthalmology/optometry visit for glaucoma screening and checkup Recommended yearly dental visit for hygiene and checkup  Vaccinations: Influenza vaccine: Up to date Pneumococcal vaccine: Completed vaccines Tdap vaccine: Discuss with pharmacy Shingles vaccine: Discuss with pharmacy  Covid-19:Completed vaccines  Advanced directives: Copy in chart  Conditions/risks identified: See problem list  Next appointment: Follow up in one year for your annual wellness visit 06/07/21 @9 :40am   Preventive Care 69 Years and Older, Female Preventive care refers to lifestyle choices and visits with your health care provider that can promote health and wellness. What does preventive care include?  A yearly physical exam. This is also called an annual well check.  Dental exams once or twice a year.  Routine eye exams. Ask your health care provider how often you should have your eyes checked.  Personal lifestyle choices, including:  Daily care of your teeth and gums.  Regular physical activity.  Eating a healthy diet.  Avoiding tobacco and drug use.  Limiting alcohol use.  Practicing safe sex.  Taking low-dose aspirin every day.  Taking vitamin and mineral supplements as recommended by your health care provider. What happens during an annual well check? The services and screenings done by your health care provider during your annual well check will depend on your age, overall health, lifestyle risk factors, and family history of disease. Counseling  Your  health care provider may ask you questions about your:  Alcohol use.  Tobacco use.  Drug use.  Emotional well-being.  Home and relationship well-being.  Sexual activity.  Eating habits.  History of falls.  Memory and ability to understand (cognition).  Work and work Statistician.  Reproductive health. Screening  You may have the following tests or measurements:  Height, weight, and BMI.  Blood pressure.  Lipid and cholesterol levels. These may be checked every 5 years, or more frequently if you are over 25 years old.  Skin check.  Lung cancer screening. You may have this screening every year starting at age 1 if you have a 30-pack-year history of smoking and currently smoke or have quit within the past 15 years.  Fecal occult blood test (FOBT) of the stool. You may have this test every year starting at age 63.  Flexible sigmoidoscopy or colonoscopy. You may have a sigmoidoscopy every 5 years or a colonoscopy every 10 years starting at age 73.  Hepatitis C blood test.  Hepatitis B blood test.  Sexually transmitted disease (STD) testing.  Diabetes screening. This is done by checking your blood sugar (glucose) after you have not eaten for a while (fasting). You may have this done every 1-3 years.  Bone density scan. This is done to screen for osteoporosis. You may have this done starting at age 17.  Mammogram. This may be done every 1-2 years. Talk to your health care provider about how often you should have regular mammograms. Talk with your health care provider about your test results, treatment options, and if necessary, the need for more tests. Vaccines  Your health care provider may recommend certain vaccines, such as:  Influenza vaccine. This is recommended every year.  Tetanus, diphtheria, and acellular pertussis (  Tdap, Td) vaccine. You may need a Td booster every 10 years.  Zoster vaccine. You may need this after age 23.  Pneumococcal 13-valent  conjugate (PCV13) vaccine. One dose is recommended after age 24.  Pneumococcal polysaccharide (PPSV23) vaccine. One dose is recommended after age 67. Talk to your health care provider about which screenings and vaccines you need and how often you need them. This information is not intended to replace advice given to you by your health care provider. Make sure you discuss any questions you have with your health care provider. Document Released: 07/14/2015 Document Revised: 03/06/2016 Document Reviewed: 04/18/2015 Elsevier Interactive Patient Education  2017 [place] Prevention in the Home Falls can cause injuries. They can happen to people of all ages. There are many things you can do to make your home safe and to help prevent falls. What can I do on the outside of my home?  Regularly fix the edges of walkways and driveways and fix any cracks.  Remove anything that might make you trip as you walk through a door, such as a raised step or threshold.  Trim any bushes or trees on the path to your home.  Use bright outdoor lighting.  Clear any walking paths of anything that might make someone trip, such as rocks or tools.  Regularly check to see if handrails are loose or broken. Make sure that both sides of any steps have handrails.  Any raised decks and porches should have guardrails on the edges.  Have any leaves, snow, or ice cleared regularly.  Use sand or salt on walking paths during winter.  Clean up any spills in your garage right away. This includes oil or grease spills. What can I do in the bathroom?  Use night lights.  Install grab bars by the toilet and in the tub and shower. Do not use towel bars as grab bars.  Use non-skid mats or decals in the tub or shower.  If you need to sit down in the shower, use a plastic, non-slip stool.  Keep the floor dry. Clean up any water that spills on the floor as soon as it happens.  Remove soap buildup in the tub or  shower regularly.  Attach bath mats securely with double-sided non-slip rug tape.  Do not have throw rugs and other things on the floor that can make you trip. What can I do in the bedroom?  Use night lights.  Make sure that you have a light by your bed that is easy to reach.  Do not use any sheets or blankets that are too big for your bed. They should not hang down onto the floor.  Have a firm chair that has side arms. You can use this for support while you get dressed.  Do not have throw rugs and other things on the floor that can make you trip. What can I do in the kitchen?  Clean up any spills right away.  Avoid walking on wet floors.  Keep items that you use a lot in easy-to-reach places.  If you need to reach something above you, use a strong step stool that has a grab bar.  Keep electrical cords out of the way.  Do not use floor polish or wax that makes floors slippery. If you must use wax, use non-skid floor wax.  Do not have throw rugs and other things on the floor that can make you trip. What can I do with my stairs?  Do  not leave any items on the stairs.  Make sure that there are handrails on both sides of the stairs and use them. Fix handrails that are broken or loose. Make sure that handrails are as long as the stairways.  Check any carpeting to make sure that it is firmly attached to the stairs. Fix any carpet that is loose or worn.  Avoid having throw rugs at the top or bottom of the stairs. If you do have throw rugs, attach them to the floor with carpet tape.  Make sure that you have a light switch at the top of the stairs and the bottom of the stairs. If you do not have them, ask someone to add them for you. What else can I do to help prevent falls?  Wear shoes that:  Do not have high heels.  Have rubber bottoms.  Are comfortable and fit you well.  Are closed at the toe. Do not wear sandals.  If you use a stepladder:  Make sure that it is fully  opened. Do not climb a closed stepladder.  Make sure that both sides of the stepladder are locked into place.  Ask someone to hold it for you, if possible.  Clearly mark and make sure that you can see:  Any grab bars or handrails.  First and last steps.  Where the edge of each step is.  Use tools that help you move around (mobility aids) if they are needed. These include:  Canes.  Walkers.  Scooters.  Crutches.  Turn on the lights when you go into a dark area. Replace any light bulbs as soon as they burn out.  Set up your furniture so you have a clear path. Avoid moving your furniture around.  If any of your floors are uneven, fix them.  If there are any pets around you, be aware of where they are.  Review your medicines with your doctor. Some medicines can make you feel dizzy. This can increase your chance of falling. Ask your doctor what other things that you can do to help prevent falls. This information is not intended to replace advice given to you by your health care provider. Make sure you discuss any questions you have with your health care provider. Document Released: 04/13/2009 Document Revised: 11/23/2015 Document Reviewed: 07/22/2014 Elsevier Interactive Patient Education  2017 [employer].

## 2020-06-28 ENCOUNTER — Telehealth: Payer: Medicare Other | Admitting: Emergency Medicine

## 2020-06-28 DIAGNOSIS — R059 Cough, unspecified: Secondary | ICD-10-CM

## 2020-06-28 MED ORDER — BENZONATATE 100 MG PO CAPS: 100.0000 mg | ORAL_CAPSULE | Freq: Two times a day (BID) | ORAL | 0 refills | Status: DC | PRN

## 2020-06-28 MED ORDER — AZITHROMYCIN 250 MG PO TABS: ORAL_TABLET | ORAL | 0 refills | Status: DC

## 2020-06-28 NOTE — Progress Notes (Signed)
We are sorry that you are not feeling well.  Here is how we plan to help!  Based on your presentation I believe you most likely have A cough due to bacteria.  When patients have a fever and a productive cough with a change in color or increased sputum production, we are concerned about bacterial bronchitis.  If left untreated it can progress to pneumonia.  If your symptoms do not improve with your treatment plan it is important that you contact your provider.   I have prescribed Azithromyin 250 mg: two tablets now and then one tablet daily for 4 additonal days    In addition  you may use A prescription cough medication called Tessalon Perles 100mg. You may take 1-2 capsules every 8 hours as needed for your cough.    From your responses in the eVisit questionnaire you describe inflammation in the upper respiratory tract which is causing a significant cough.  This is commonly called Bronchitis and has four common causes:    Allergies  Viral Infections  Acid Reflux  Bacterial Infection Allergies, viruses and acid reflux are treated by controlling symptoms or eliminating the cause. An example might be a cough caused by taking certain blood pressure medications. You stop the cough by changing the medication. Another example might be a cough caused by acid reflux. Controlling the reflux helps control the cough.  USE OF BRONCHODILATOR ("RESCUE") INHALERS: There is a risk from using your bronchodilator too frequently.  The risk is that over-reliance on a medication which only relaxes the muscles surrounding the breathing tubes can reduce the effectiveness of medications prescribed to reduce swelling and congestion of the tubes themselves.  Although you feel brief relief from the bronchodilator inhaler, your asthma may actually be worsening with the tubes becoming more swollen and filled with mucus.  This can delay other crucial treatments, such as oral steroid medications. If you need to use a  bronchodilator inhaler daily, several times per day, you should discuss this with your provider.  There are probably better treatments that could be used to keep your asthma under control.     HOME CARE . Only take medications as instructed by your medical team. . Complete the entire course of an antibiotic. . Drink plenty of fluids and get plenty of rest. . Avoid close contacts especially the very young and the elderly . Cover your mouth if you cough or cough into your sleeve. . Always remember to wash your hands . A steam or ultrasonic humidifier can help congestion.   GET HELP RIGHT AWAY IF: . You develop worsening fever. . You become short of breath . You cough up blood. . Your symptoms persist after you have completed your treatment plan MAKE SURE YOU   Understand these instructions.  Will watch your condition.  Will get help right away if you are not doing well or get worse.  Your e-visit answers were reviewed by a board certified advanced clinical practitioner to complete your personal care plan.  Depending on the condition, your plan could have included both over the counter or prescription medications. If there is a problem please reply  once you have received a response from your provider. Your safety is important to us.  If you have drug allergies check your prescription carefully.    You can use MyChart to ask questions about today's visit, request a non-urgent call back, or ask for a work or school excuse for 24 hours related to this e-Visit. If it   has been greater than 24 hours you will need to follow up with your provider, or enter a new e-Visit to address those concerns. You will get an e-mail in the next two days asking about your experience.  I hope that your e-visit has been valuable and will speed your recovery. Thank you for using e-visits.  Approximately 5 minutes was used in reviewing the patient's chart, questionnaire, prescribing medications, and  documentation.  

## 2020-08-03 ENCOUNTER — Other Ambulatory Visit: Payer: Self-pay | Admitting: Family Medicine

## 2020-08-03 DIAGNOSIS — E785 Hyperlipidemia, unspecified: Secondary | ICD-10-CM

## 2020-08-14 ENCOUNTER — Other Ambulatory Visit (HOSPITAL_BASED_OUTPATIENT_CLINIC_OR_DEPARTMENT_OTHER): Payer: Self-pay | Admitting: Family Medicine

## 2020-08-14 DIAGNOSIS — Z1231 Encounter for screening mammogram for malignant neoplasm of breast: Secondary | ICD-10-CM

## 2020-08-21 ENCOUNTER — Ambulatory Visit (HOSPITAL_BASED_OUTPATIENT_CLINIC_OR_DEPARTMENT_OTHER)
Admission: RE | Admit: 2020-08-21 | Discharge: 2020-08-21 | Disposition: A | Discharge status: Home / Self Care | Payer: Medicare Other | Source: Ambulatory Visit | Attending: Family Medicine | Admitting: Family Medicine

## 2020-08-21 ENCOUNTER — Other Ambulatory Visit: Payer: Self-pay

## 2020-08-21 DIAGNOSIS — Z1231 Encounter for screening mammogram for malignant neoplasm of breast: Secondary | ICD-10-CM | POA: Diagnosis present

## 2020-08-21 IMAGING — MG MM DIGITAL SCREENING BILAT W/ TOMO AND CAD
7 series · 8 of 19 positions shown · non-contrast
Comparison: Previous exam(s).

CLINICAL DATA: Screening.

EXAM:
DIGITAL SCREENING BILATERAL MAMMOGRAM WITH TOMOSYNTHESIS AND CAD
TECHNIQUE: Bilateral screening digital craniocaudal and mediolateral oblique
mammograms were obtained. Bilateral screening digital breast
tomosynthesis was performed. The images were evaluated with
computer-aided detection.

[R CC synth-2D]
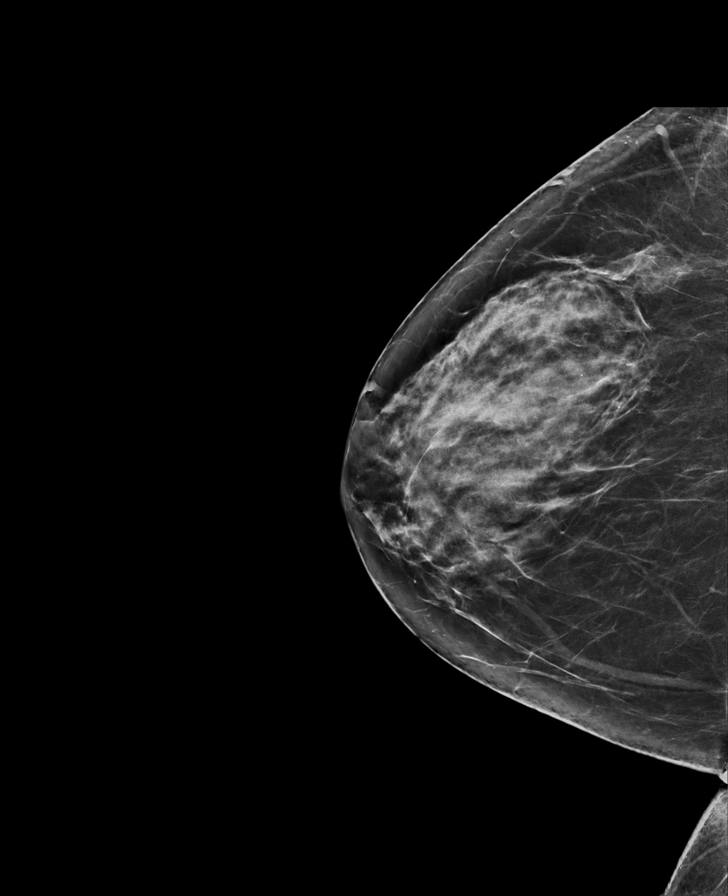

[L CC synth-2D]
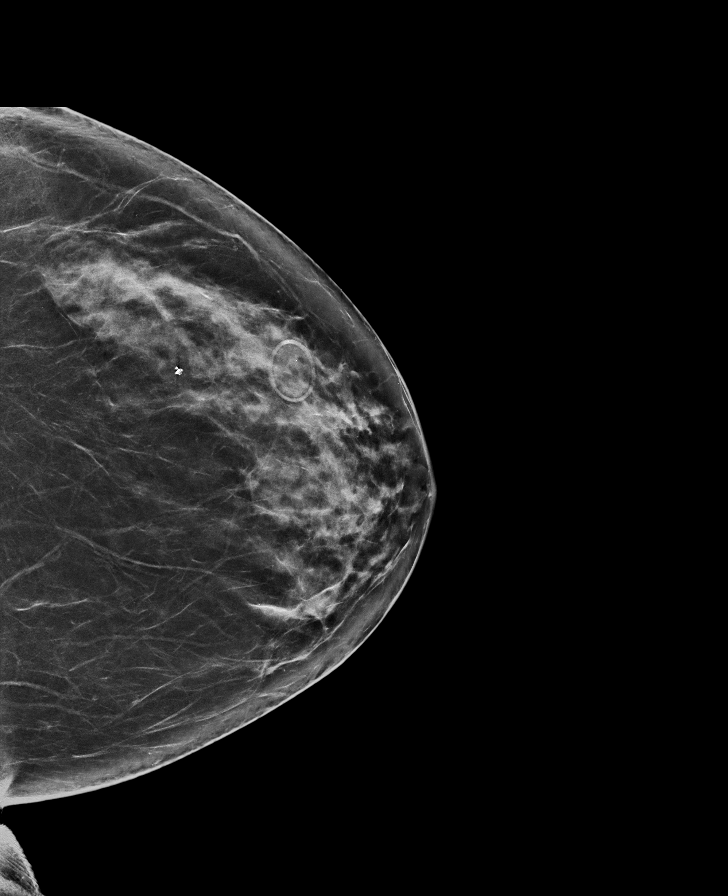

[L MLO synth-2D]
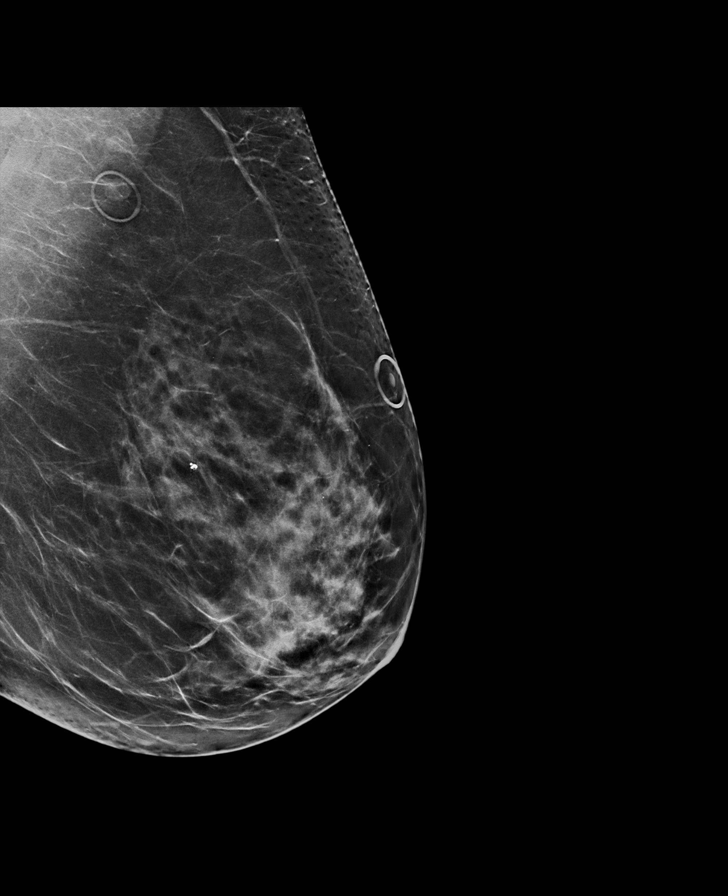

[R MLO synth-2D]
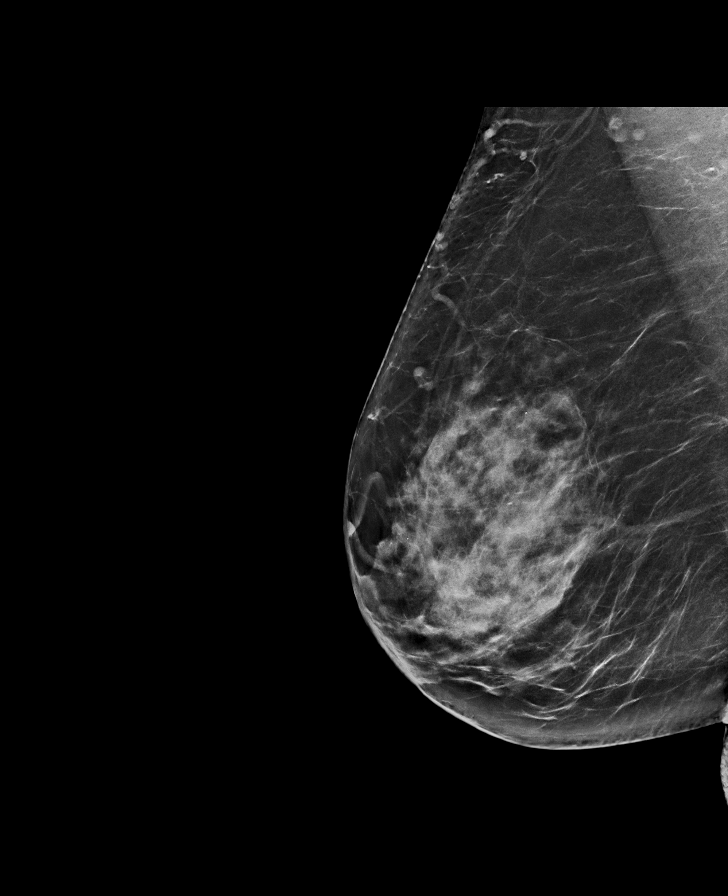

[L MLO tomo · 2 of 81 frames shown]
[frame 27/81]
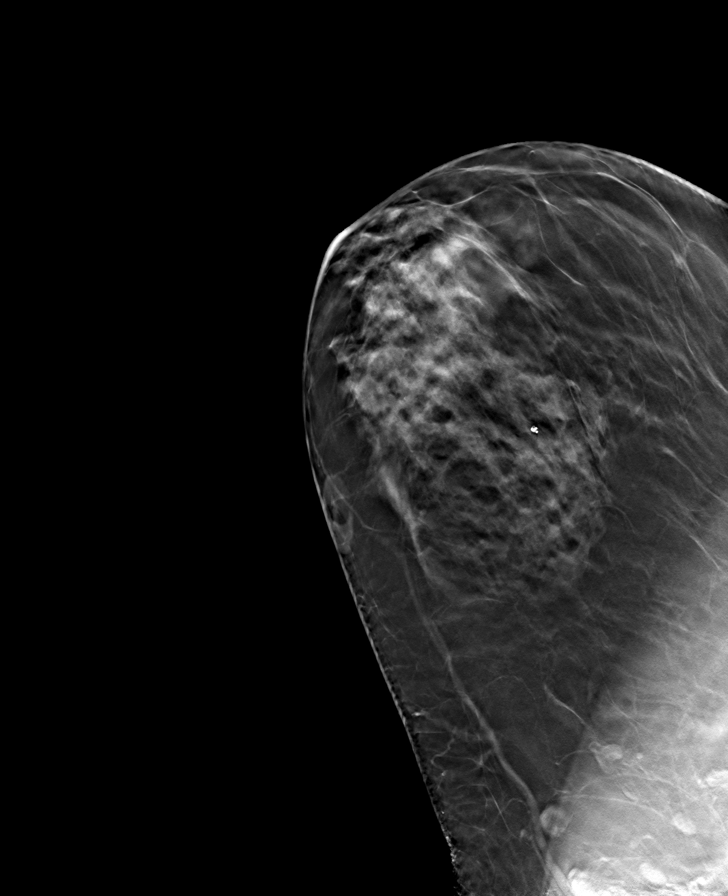
[frame 41/81]
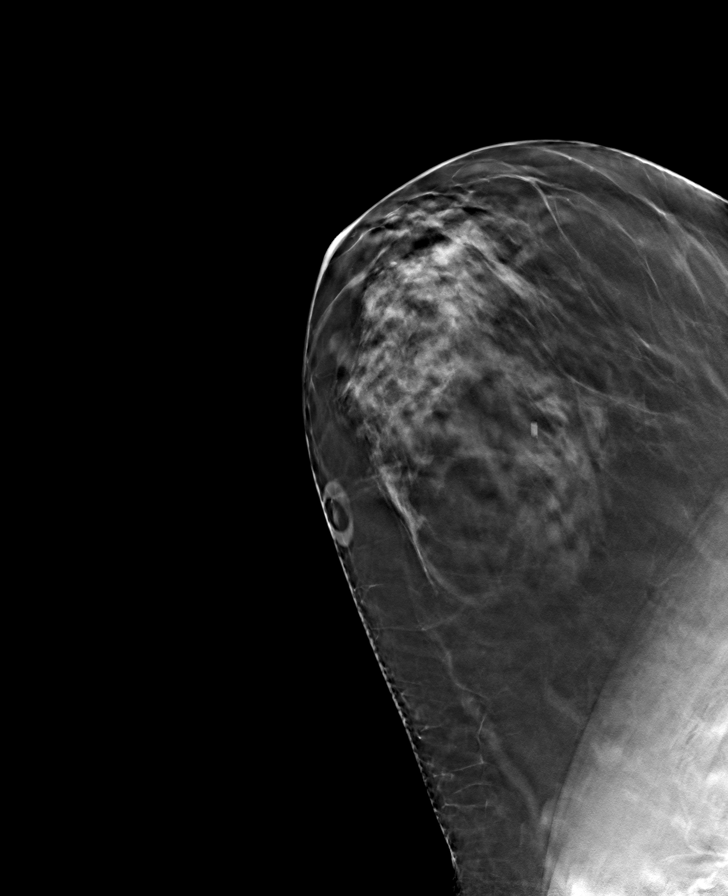

[R CC tomo · tomo slice 37/73.0]
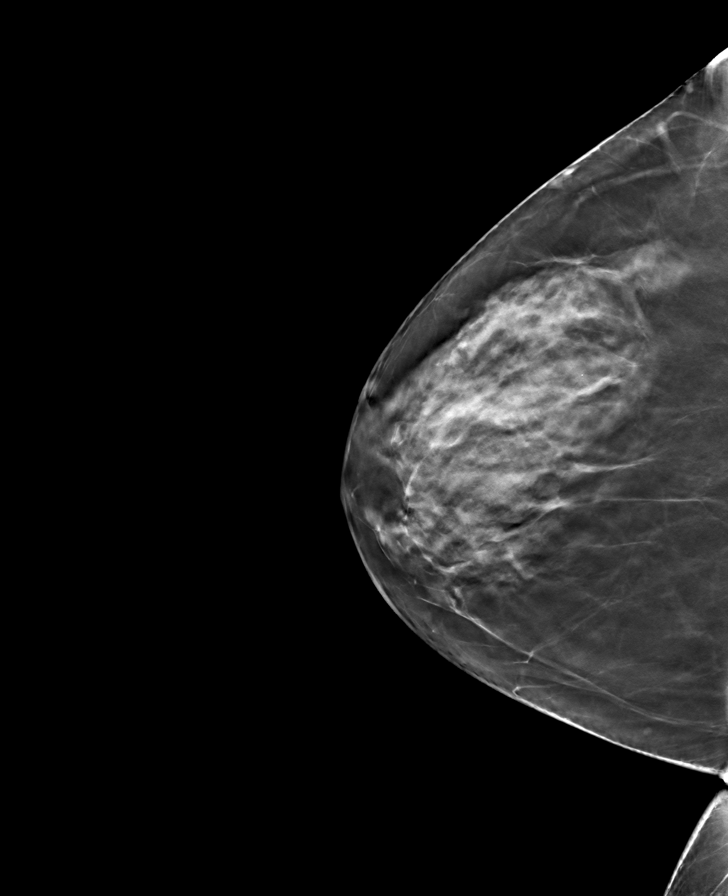

[L CC tomo · tomo slice 39/76.0]
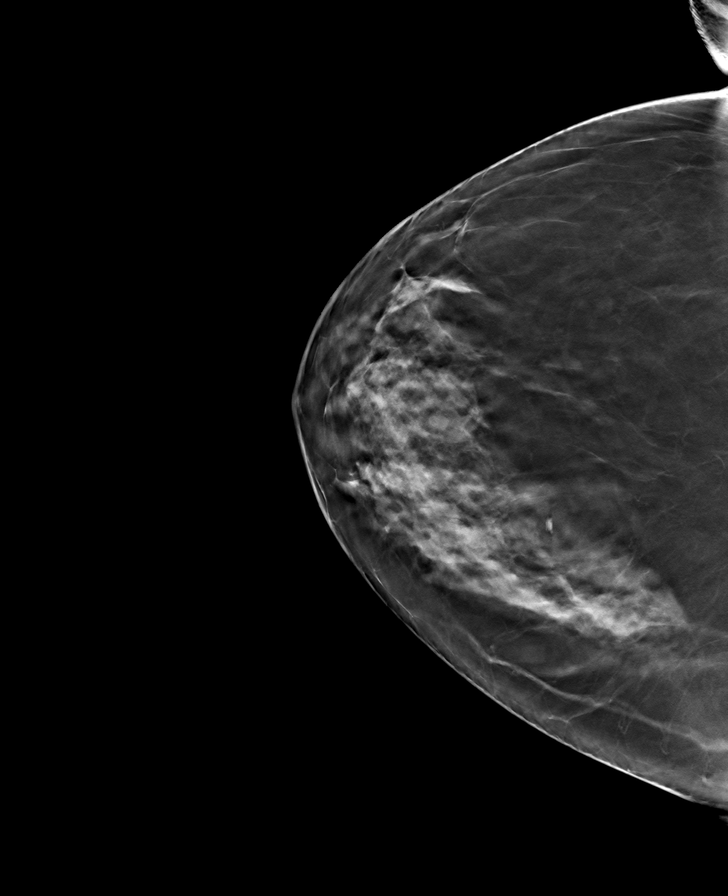

[8 of 19 positions shown; findings below may reference images not displayed]

ACR Breast Density Category c: The breast tissue is heterogeneously
dense, which may obscure small masses.
FINDINGS: There are no findings suspicious for malignancy.
IMPRESSION: No mammographic evidence of malignancy. A result letter of this
screening mammogram will be mailed directly to the patient.

RECOMMENDATION:
Screening mammogram in one year. (Code:[V2])

BI-RADS CATEGORY  1: Negative.

## 2020-08-26 ENCOUNTER — Other Ambulatory Visit: Payer: Self-pay | Admitting: Family Medicine

## 2020-08-26 DIAGNOSIS — E785 Hyperlipidemia, unspecified: Secondary | ICD-10-CM

## 2020-09-19 ENCOUNTER — Other Ambulatory Visit: Payer: Self-pay | Admitting: Family Medicine

## 2020-09-19 DIAGNOSIS — E785 Hyperlipidemia, unspecified: Secondary | ICD-10-CM

## 2020-10-13 ENCOUNTER — Other Ambulatory Visit: Payer: Self-pay | Admitting: Family Medicine

## 2020-10-13 DIAGNOSIS — I10 Essential (primary) hypertension: Secondary | ICD-10-CM

## 2020-11-28 ENCOUNTER — Other Ambulatory Visit: Payer: Self-pay

## 2020-11-28 ENCOUNTER — Ambulatory Visit: Payer: Self-pay

## 2020-11-28 ENCOUNTER — Ambulatory Visit: Payer: Medicare Other | Admitting: Family Medicine

## 2020-11-28 ENCOUNTER — Encounter: Payer: Self-pay | Admitting: Family Medicine

## 2020-11-28 DIAGNOSIS — M1711 Unilateral primary osteoarthritis, right knee: Secondary | ICD-10-CM | POA: Diagnosis not present

## 2020-11-28 MED ORDER — TRIAMCINOLONE ACETONIDE 40 MG/ML IJ SUSP
40.0000 mg | Freq: Once | INTRAMUSCULAR | Status: AC
  Administered 2020-11-28: 40 mg via INTRA_ARTICULAR

## 2020-11-28 NOTE — Progress Notes (Signed)
Heather Pierce - 70 y.o. female MRN [id number]  Date of birth: 12/03/1950  SUBJECTIVE:  Including CC & ROS.  No chief complaint on file.   Heather Pierce is a 70 y.o. female that is presenting with worsening of her right knee pain.  Denies any injury or inciting event.  Extension of family she was walking.   Review of Systems See HPI   HISTORY: Past Medical, Surgical, Social, and Family History Reviewed & Updated per EMR.   Pertinent Historical Findings include:  Past Medical History:  Diagnosis Date  . Allergy   . GERD (gastroesophageal reflux disease)   . Hyperlipidemia   . Hypertension     Past Surgical History:  Procedure Laterality Date  . KNEE ARTHROSCOPY  11/2009   left     Family History  Problem Relation Age of Onset  . Jaundice Father   . GER disease Father   . COPD Father   . Dementia Father   . Osteoarthritis Father        In nursing home  . Cancer Father        melanoma  . Alzheimer's disease Father   . Colon polyps Father   . Alzheimer's disease Mother   . Dementia Mother   . Arthritis Other   . Hyperlipidemia Other   . Hypertension Other   . Coronary artery disease Other        1st degree relative @60s   . Melanoma Other   . Colon cancer Maternal Grandmother 64    Social History   Socioeconomic History  . Marital status: Single    Spouse name: Not on file  . Number of children: Not on file  . Years of education: Not on file  . Highest education level: Not on file  Occupational History  . Occupation: retired    Comment: [employer]    Employer: [employer]  Tobacco Use  . Smoking status: Never Smoker  . Smokeless tobacco: Never Used  Substance and Sexual Activity  . Alcohol use: Yes    Comment: occ. maybe 2 drinks per month per pt.  . Drug use: No  . Sexual activity: Never    Partners: Male    Birth control/protection: None  Other Topics Concern  . Not on file  Social History Narrative   Exercise-- walking dog   Social  Determinants of Health   Financial Resource Strain: Low Risk   . Difficulty of Paying Living Expenses: Not hard at all  Food Insecurity: No Food Insecurity  . Worried About Charity fundraiser in the Last Year: Never true  . Ran Out of Food in the Last Year: Never true  Transportation Needs: No Transportation Needs  . Lack of Transportation (Medical): No  . Lack of Transportation (Non-Medical): No  Physical Activity: Sufficiently Active  . Days of Exercise per Week: 7 days  . Minutes of Exercise per Session: 40 min  Stress: No Stress Concern Present  . Feeling of Stress : Not at all  Social Connections: Socially Isolated  . Frequency of Communication with Friends and Family: More than three times a week  . Frequency of Social Gatherings with Friends and Family: More than three times a week  . Attends Religious Services: Never  . Active Member of Clubs or Organizations: No  . Attends Archivist Meetings: Never  . Marital Status: Never married  Intimate Partner Violence: Not At Risk  . Fear of Current or Ex-Partner: No  . Emotionally Abused: No  .  Physically Abused: No  . Sexually Abused: No     PHYSICAL EXAM:  VS: BP 120/72 (BP Location: Left Arm, Patient Position: Sitting, Cuff Size: Large)   Ht 5\' 3"  (1.6 m)   Wt 203 lb (92.1 kg)   LMP 09/24/2005   BMI 35.96 kg/m  Physical Exam Gen: NAD, alert, cooperative with exam, well-appearing MSK:  Right knee: Mild effusion. Normal range of motion. Neurovascular intact   Aspiration/Injection Procedure Note Heather Pierce [birth year]  Procedure: Injection Indications: Right knee pain  Procedure Details Consent: Risks of procedure as well as the alternatives and risks of each were explained to the (patient/caregiver).  Consent for procedure obtained. Time Out: Verified patient identification, verified procedure, site/side was marked, verified correct patient position, special equipment/implants available,  medications/allergies/relevent history reviewed, required imaging and test results available.  Performed.  The area was cleaned with iodine and alcohol swabs.    The right knee superior lateral suprapatellar pouch was injected using 1 cc's of 40 mg Kenalog and 4 cc's of 0.25% bupivacaine with a 22 1 1/2" needle.  Ultrasound was used. Images were obtained in long views showing the injection.     A sterile dressing was applied.  Patient did tolerate procedure well.     ASSESSMENT & PLAN:   Primary osteoarthritis of right knee Acute on chronic in nature.  Has done well with previous gel injection up to this point. -Counseled on home exercise therapy and supportive care. -Steroid injection today. -Could consider another gel injection

## 2020-11-28 NOTE — Assessment & Plan Note (Addendum)
Acute on chronic in nature.  Has done well with previous gel injection up to this point. -Counseled on home exercise therapy and supportive care. -Steroid injection today. -Could consider another gel injection

## 2020-11-28 NOTE — Patient Instructions (Signed)
Good to see you Please use ice  You can consider compression   Please send me a message in MyChart with any questions or updates.  Please see me back in 4 weeks or as needed if better.   --Dr. Raeford Razor

## 2020-12-16 ENCOUNTER — Other Ambulatory Visit: Payer: Self-pay | Admitting: Family Medicine

## 2020-12-16 DIAGNOSIS — E785 Hyperlipidemia, unspecified: Secondary | ICD-10-CM

## 2021-01-18 ENCOUNTER — Other Ambulatory Visit: Payer: Self-pay

## 2021-01-18 ENCOUNTER — Encounter: Payer: Self-pay | Admitting: Family Medicine

## 2021-01-18 ENCOUNTER — Ambulatory Visit: Payer: Medicare Other | Admitting: Family Medicine

## 2021-01-18 DIAGNOSIS — M1711 Unilateral primary osteoarthritis, right knee: Secondary | ICD-10-CM | POA: Diagnosis not present

## 2021-01-18 NOTE — Assessment & Plan Note (Signed)
Acute on chronic in nature. -Counseled home exercise therapy and supportive care. -Pursue Zilretta injection. -Could consider PRP or physical therapy.

## 2021-01-18 NOTE — Progress Notes (Signed)
Heather Pierce - 70 y.o. female MRN [id number]  Date of birth: 1950-12-15  SUBJECTIVE:  Including CC & ROS.  No chief complaint on file.   Heather Pierce is a 70 y.o. female that is presenting with acute worsening of her right knee pain.  The pain is similar to pain in the past.  She has not done well with previous gel injections.   Review of Systems See HPI   HISTORY: Past Medical, Surgical, Social, and Family History Reviewed & Updated per EMR.   Pertinent Historical Findings include:  Past Medical History:  Diagnosis Date   Allergy    GERD (gastroesophageal reflux disease)    Hyperlipidemia    Hypertension     Past Surgical History:  Procedure Laterality Date   KNEE ARTHROSCOPY  11/2009   left     Family History  Problem Relation Age of Onset   Jaundice Father    GER disease Father    COPD Father    Dementia Father    Osteoarthritis Father        In nursing home   Cancer Father        melanoma   Alzheimer's disease Father    Colon polyps Father    Alzheimer's disease Mother    Dementia Mother    Arthritis Other    Hyperlipidemia Other    Hypertension Other    Coronary artery disease Other        1st degree relative @60s    Melanoma Other    Colon cancer Maternal Grandmother 10    Social History   Socioeconomic History   Marital status: Single    Spouse name: Not on file   Number of children: Not on file   Years of education: Not on file   Highest education level: Not on file  Occupational History   Occupation: retired    Comment: [employer]    Employer: [employer]  Tobacco Use   Smoking status: Never   Smokeless tobacco: Never  Substance and Sexual Activity   Alcohol use: Yes    Comment: occ. maybe 2 drinks per month per pt.   Drug use: No   Sexual activity: Never    Partners: Male    Birth control/protection: None  Other Topics Concern   Not on file  Social History Narrative   Exercise-- walking dog   Social Determinants of Health    Financial Resource Strain: Low Risk    Difficulty of Paying Living Expenses: Not hard at all  Food Insecurity: No Food Insecurity   Worried About Charity fundraiser in the Last Year: Never true   [place] in the Last Year: Never true  Transportation Needs: No Transportation Needs   Lack of Transportation (Medical): No   Lack of Transportation (Non-Medical): No  Physical Activity: Sufficiently Active   Days of Exercise per Week: 7 days   Minutes of Exercise per Session: 40 min  Stress: No Stress Concern Present   Feeling of Stress : Not at all  Social Connections: Socially Isolated   Frequency of Communication with Friends and Family: More than three times a week   Frequency of Social Gatherings with Friends and Family: More than three times a week   Attends Religious Services: Never   Marine scientist or Organizations: No   Attends Archivist Meetings: Never   Marital Status: Never married  Human resources officer Violence: Not At Risk   Fear of Current or Ex-Partner: No  Emotionally Abused: No   Physically Abused: No   Sexually Abused: No     PHYSICAL EXAM:  VS: BP 132/78 (BP Location: Left Arm, Patient Position: Sitting, Cuff Size: Large)   Ht 5\' 3"  (1.6 m)   Wt 203 lb (92.1 kg)   LMP 09/24/2005   BMI 35.96 kg/m  Physical Exam Gen: NAD, alert, cooperative with exam, well-appearing       ASSESSMENT & PLAN:   Primary osteoarthritis of right knee Acute on chronic in nature. -Counseled home exercise therapy and supportive care. -Pursue Zilretta injection. -Could consider PRP or physical therapy.

## 2021-01-24 ENCOUNTER — Encounter: Payer: Self-pay | Admitting: Family Medicine

## 2021-01-25 ENCOUNTER — Encounter: Payer: Self-pay | Admitting: Family Medicine

## 2021-01-25 ENCOUNTER — Ambulatory Visit: Payer: Medicare Other | Admitting: Family Medicine

## 2021-01-25 ENCOUNTER — Ambulatory Visit: Payer: Medicare Other | Attending: Internal Medicine

## 2021-01-25 ENCOUNTER — Ambulatory Visit: Payer: Self-pay

## 2021-01-25 ENCOUNTER — Other Ambulatory Visit: Payer: Self-pay

## 2021-01-25 VITALS — BP 138/72 | Ht 63.0 in | Wt 203.0 lb

## 2021-01-25 DIAGNOSIS — M1711 Unilateral primary osteoarthritis, right knee: Secondary | ICD-10-CM

## 2021-01-25 DIAGNOSIS — Z23 Encounter for immunization: Secondary | ICD-10-CM

## 2021-01-25 NOTE — Progress Notes (Signed)
Heather Pierce - 70 y.o. female MRN [id number]  Date of birth: 01/11/1951  SUBJECTIVE:  Including CC & ROS.  No chief complaint on file.   Heather Pierce is a 70 y.o. female that is  here for zilretta injection.   Review of Systems See HPI   HISTORY: Past Medical, Surgical, Social, and Family History Reviewed & Updated per EMR.   Pertinent Historical Findings include:  Past Medical History:  Diagnosis Date   Allergy    GERD (gastroesophageal reflux disease)    Hyperlipidemia    Hypertension     Past Surgical History:  Procedure Laterality Date   KNEE ARTHROSCOPY  11/2009   left     Family History  Problem Relation Age of Onset   Jaundice Father    GER disease Father    COPD Father    Dementia Father    Osteoarthritis Father        In nursing home   Cancer Father        melanoma   Alzheimer's disease Father    Colon polyps Father    Alzheimer's disease Mother    Dementia Mother    Arthritis Other    Hyperlipidemia Other    Hypertension Other    Coronary artery disease Other        1st degree relative @60s    Melanoma Other    Colon cancer Maternal Grandmother 5    Social History   Socioeconomic History   Marital status: Single    Spouse name: Not on file   Number of children: Not on file   Years of education: Not on file   Highest education level: Not on file  Occupational History   Occupation: retired    Comment: [employer]    Employer: [employer]  Tobacco Use   Smoking status: Never   Smokeless tobacco: Never  Substance and Sexual Activity   Alcohol use: Yes    Comment: occ. maybe 2 drinks per month per pt.   Drug use: No   Sexual activity: Never    Partners: Male    Birth control/protection: None  Other Topics Concern   Not on file  Social History Narrative   Exercise-- walking dog   Social Determinants of Health   Financial Resource Strain: Low Risk    Difficulty of Paying Living Expenses: Not hard at all  Food Insecurity: No Food  Insecurity   Worried About Charity fundraiser in the Last Year: Never true   [place] in the Last Year: Never true  Transportation Needs: No Transportation Needs   Lack of Transportation (Medical): No   Lack of Transportation (Non-Medical): No  Physical Activity: Sufficiently Active   Days of Exercise per Week: 7 days   Minutes of Exercise per Session: 40 min  Stress: No Stress Concern Present   Feeling of Stress : Not at all  Social Connections: Socially Isolated   Frequency of Communication with Friends and Family: More than three times a week   Frequency of Social Gatherings with Friends and Family: More than three times a week   Attends Religious Services: Never   Marine scientist or Organizations: No   Attends Music therapist: Never   Marital Status: Never married  Human resources officer Violence: Not At Risk   Fear of Current or Ex-Partner: No   Emotionally Abused: No   Physically Abused: No   Sexually Abused: No     PHYSICAL EXAM:  VS: BP  138/72 (BP Location: Left Arm, Patient Position: Sitting, Cuff Size: Large)   Ht '5\' 3"'$  (1.6 m)   Wt 203 lb (92.1 kg)   LMP 09/24/2005   BMI 35.96 kg/m  Physical Exam Gen: NAD, alert, cooperative with exam, well-appearing    Aspiration/Injection Procedure Note Heather Pierce Feb 17, 1951  Procedure: Injection Indications: right knee pain  Procedure Details Consent: Risks of procedure as well as the alternatives and risks of each were explained to the (patient/caregiver).  Consent for procedure obtained. Time Out: Verified patient identification, verified procedure, site/side was marked, verified correct patient position, special equipment/implants available, medications/allergies/relevent history reviewed, required imaging and test results available.  Performed.  The area was cleaned with iodine and alcohol swabs.    The right knee superior lateral suprapatellar pouch was injected using 3 cc of 1% lidocaine on  a 25-gauge 1-1/2 inch needle.  An 18-gauge 1-1/2 needle was used to achieve aspiration.  The syringe was switched and a mixture containing 5 cc's of 32 mg Zilretta and 4 cc's of 0.25% bupivacaine was injected.  Ultrasound was used. Images were obtained in long views showing the injection.   Amount of Fluid Aspirated:  27m Character of Fluid: clear and straw colored Fluid was sent for: n/a  A sterile dressing was applied.  Patient did tolerate procedure well.        ASSESSMENT & PLAN:   Primary osteoarthritis of right knee zilretta injection today. Could consider PRP or gel.

## 2021-01-25 NOTE — Assessment & Plan Note (Signed)
zilretta injection today. Could consider PRP or gel.

## 2021-01-25 NOTE — Progress Notes (Signed)
   Covid-19 Vaccination Clinic  Name:  Lecia Amante    MRN: [id number] DOB: 11/14/1950  01/25/2021  Ms. Rylee was observed post Covid-19 immunization for 15 minutes without incident. She was provided with Vaccine Information Sheet and instruction to access the V-Safe system.   Ms. Worthen was instructed to call 911 with any severe reactions post vaccine: Difficulty breathing  Swelling of face and throat  A fast heartbeat  A bad rash all over body  Dizziness and weakness   Immunizations Administered     Name Date Dose VIS Date Route   PFIZER Comrnaty(Gray TOP) Covid-19 Vaccine 01/25/2021 12:24 PM 0.3 mL 06/08/2020 Intramuscular   Manufacturer: [place]   Lot: [id number]   NDC: [phone]

## 2021-01-25 NOTE — Patient Instructions (Signed)
Good to see you Please try ice   Please send me a message in MyChart with any questions or updates.  Please see me back in 4 weeks or as needed if better.   --Dr. Raeford Razor

## 2021-01-30 ENCOUNTER — Other Ambulatory Visit (HOSPITAL_BASED_OUTPATIENT_CLINIC_OR_DEPARTMENT_OTHER): Payer: Self-pay

## 2021-01-30 MED ORDER — COVID-19 MRNA VAC-TRIS(PFIZER) 30 MCG/0.3ML IM SUSP
INTRAMUSCULAR | 0 refills | Status: DC
  Filled 2021-01-30: qty 0.3, 1d supply, fill #0

## 2021-02-13 ENCOUNTER — Encounter: Payer: Self-pay | Admitting: Family Medicine

## 2021-04-06 ENCOUNTER — Encounter: Payer: Medicare Other | Admitting: Family Medicine

## 2021-04-23 ENCOUNTER — Other Ambulatory Visit: Payer: Self-pay

## 2021-04-24 ENCOUNTER — Encounter: Payer: Self-pay | Admitting: Family Medicine

## 2021-04-24 ENCOUNTER — Ambulatory Visit (INDEPENDENT_AMBULATORY_CARE_PROVIDER_SITE_OTHER): Payer: Medicare Other | Admitting: Family Medicine

## 2021-04-24 VITALS — BP 132/74 | HR 72 | Temp 98.5°F | Resp 18 | Ht 63.0 in | Wt 203.0 lb

## 2021-04-24 DIAGNOSIS — E785 Hyperlipidemia, unspecified: Secondary | ICD-10-CM

## 2021-04-24 DIAGNOSIS — I10 Essential (primary) hypertension: Secondary | ICD-10-CM | POA: Diagnosis not present

## 2021-04-24 DIAGNOSIS — Z23 Encounter for immunization: Secondary | ICD-10-CM | POA: Diagnosis not present

## 2021-04-24 DIAGNOSIS — Z0000 Encounter for general adult medical examination without abnormal findings: Secondary | ICD-10-CM

## 2021-04-24 LAB — COMPREHENSIVE METABOLIC PANEL
ALT: 19 U/L (ref 0–35)
AST: 19 U/L (ref 0–37)
Albumin: 4.5 g/dL (ref 3.5–5.2)
Alkaline Phosphatase: 68 U/L (ref 39–117)
BUN: 17 mg/dL (ref 6–23)
CO2: 29 mEq/L (ref 19–32)
Calcium: 9.6 mg/dL (ref 8.4–10.5)
Chloride: 100 mEq/L (ref 96–112)
Creatinine, Ser: 0.9 mg/dL (ref 0.40–1.20)
GFR: 64.84 mL/min (ref >60.00)
Glucose, Bld: 94 mg/dL (ref 70–99)
Potassium: 4.3 mEq/L (ref 3.5–5.1)
Sodium: 137 mEq/L (ref 135–145)
Total Bilirubin: 0.7 mg/dL (ref 0.2–1.2)
Total Protein: 6.5 g/dL (ref 6.0–8.3)

## 2021-04-24 LAB — CBC WITH DIFFERENTIAL/PLATELET
Basophils Absolute: 0 10*3/uL (ref 0.0–0.1)
Basophils Relative: 0.5 % (ref 0.0–3.0)
Eosinophils Absolute: 0.1 10*3/uL (ref 0.0–0.7)
Eosinophils Relative: 1.4 % (ref 0.0–5.0)
HCT: 38.5 % (ref 36.0–46.0)
Hemoglobin: 13.2 g/dL (ref 12.0–15.0)
Lymphocytes Relative: 31.4 % (ref 12.0–46.0)
Lymphs Abs: 1.7 10*3/uL (ref 0.7–4.0)
MCHC: 34.3 g/dL (ref 30.0–36.0)
MCV: 87.4 fl (ref 78.0–100.0)
Monocytes Absolute: 0.5 10*3/uL (ref 0.1–1.0)
Monocytes Relative: 8.7 % (ref 3.0–12.0)
Neutro Abs: 3.2 10*3/uL (ref 1.4–7.7)
Neutrophils Relative %: 58 % (ref 43.0–77.0)
Platelets: 166 10*3/uL (ref 150.0–400.0)
RBC: 4.4 Mil/uL (ref 3.87–5.11)
RDW: 12.7 % (ref 11.5–15.5)
WBC: 5.4 10*3/uL (ref 4.0–10.5)

## 2021-04-24 LAB — LIPID PANEL
Cholesterol: 147 mg/dL (ref 0–200)
HDL: 56.5 mg/dL (ref >39.00)
LDL Cholesterol: 66 mg/dL (ref 0–99)
NonHDL: 90.27
Total CHOL/HDL Ratio: 3
Triglycerides: 123 mg/dL (ref 0.0–149.0)
VLDL: 24.6 mg/dL (ref 0.0–40.0)

## 2021-04-24 MED ORDER — SIMVASTATIN 40 MG PO TABS: 40.0000 mg | ORAL_TABLET | Freq: Every day | ORAL | 1 refills | Status: DC

## 2021-04-24 MED ORDER — LISINOPRIL-HYDROCHLOROTHIAZIDE 20-25 MG PO TABS: 1.0000 | ORAL_TABLET | Freq: Every day | ORAL | 1 refills | Status: DC

## 2021-04-24 NOTE — Assessment & Plan Note (Signed)
Well controlled, no changes to meds. Encouraged heart healthy diet such as the DASH diet and exercise as tolerated.  °

## 2021-04-24 NOTE — Progress Notes (Signed)
Subjective:   By signing my name below, I, Shehryar Baig, attest that this documentation has been prepared under the direction and in the presence of Dr. Roma Schanz, DO. 04/24/2021    Patient ID: Heather Pierce, female    DOB: 01-02-1951, 70 y.o.   MRN: [id number]  Chief Complaint  Patient presents with   Annual Exam    Pt states fasting     HPI Patient is in today for a comprehensive physical exam.   She complains of knee pain but is seeing a orthopedist specialist, Dr. Tamala Julian, to manage her pain. She reports having a torn meniscus in her right knee and is receiving injections to manage her pain.  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 She had a procedure to remove it almost a year ago. She continues seeing a dermatologist every 6 months to manage it.  She continues taking 20-25 mg Zestoretic daily PO, 40 mg simvastatin daily PO and reports no new issues while taking them. She is requesting a refill on them as well.  She has no recent changes to her family medical history that she knows of.  She denies having any fever, new moles, congestion, sore throat, muscle pain, joint pain, chest pain, cough, SOB, wheezing, n/v/d, constipation, blood in stool, dysuria, frequency, hematuria, or headaches at this time. She participates in exercise by walking occasionally. She does not have a membership to a gym but is planning on getting one.  She is receiving he flu vaccine during this visit. She is interested in getting the bivalent Covid-19 vaccine. She is eligible for the shingles vaccine and is interested in getting it at a later date.    Past Medical History:  Diagnosis Date   Allergy    GERD (gastroesophageal reflux disease)    Hyperlipidemia    Hypertension     Past Surgical History:  Procedure Laterality Date   KNEE ARTHROSCOPY  11/2009   left     Family History  Problem Relation Age of Onset   Jaundice  Father    GER disease Father    COPD Father    Dementia Father    Osteoarthritis Father        In nursing home   Cancer Father        melanoma   Alzheimer's disease Father    Colon polyps Father    Alzheimer's disease Mother    Dementia Mother    Arthritis Other    Hyperlipidemia Other    Hypertension Other    Coronary artery disease Other        1st degree relative @60s    Melanoma Other    Colon cancer Maternal Grandmother 17    Social History   Socioeconomic History   Marital status: Single    Spouse name: Not on file   Number of children: Not on file   Years of education: Not on file   Highest education level: Not on file  Occupational History   Occupation: retired    Comment: [employer]    Employer: [employer]  Tobacco Use   Smoking status: Never   Smokeless tobacco: Never  Substance and Sexual Activity   Alcohol use: Yes    Comment: occ. maybe 2 drinks per month per pt.   Drug use: No   Sexual activity: Never    Partners: Male    Birth control/protection: None  Other Topics Concern   Not on file  Social History Narrative   Exercise-- walking dog   Social Determinants of Health   Financial Resource Strain: Low Risk    Difficulty of Paying Living Expenses: Not hard at all  Food Insecurity: No Food Insecurity   Worried About Charity fundraiser in the Last Year: Never true   Arboriculturist in the Last Year: Never true  Transportation Needs: No Transportation Needs   Lack of Transportation (Medical): No   Lack of Transportation (Non-Medical): No  Physical Activity: Sufficiently Active   Days of Exercise per Week: 7 days   Minutes of Exercise per Session: 40 min  Stress: No Stress Concern Present   Feeling of Stress : Not at all  Social Connections: Socially Isolated   Frequency of Communication with Friends and Family: More than three times a week   Frequency of Social Gatherings with Friends and Family: More than three times a week   Attends  Religious Services: Never   Marine scientist or Organizations: No   Attends Music therapist: Never   Marital Status: Never married  Human resources officer Violence: Not At Risk   Fear of Current or Ex-Partner: No   Emotionally Abused: No   Physically Abused: No   Sexually Abused: No    Outpatient Medications Prior to Visit  Medication Sig Dispense Refill   lisinopril-hydrochlorothiazide (ZESTORETIC) 20-25 MG tablet Take 1 tablet by mouth daily. 90 tablet 1   simvastatin (ZOCOR) 40 MG tablet TAKE 1 TABLET BY MOUTH EVERY DAY 90 tablet 1   azithromycin (ZITHROMAX) 250 MG tablet Take 2 tabs today, then take 1 tab daily until gone. (Patient not taking: Reported on 04/24/2021) 6 tablet 0   benzonatate (TESSALON) 100 MG capsule Take 1 capsule (100 mg total) by mouth 2 (two) times daily as needed for cough. (Patient not taking: Reported on 04/24/2021) 20 capsule 0   COVID-19 mRNA Vac-TriS, Pfizer, SUSP injection Inject into the muscle. (Patient not taking: Reported on 04/24/2021) 0.3 mL 0   No facility-administered medications prior to visit.    No Known Allergies  Review of Systems  Constitutional:  Negative for fever and malaise/fatigue.  HENT:  Negative for congestion and sore throat.   Eyes:  Negative for blurred vision.  Respiratory:  Negative for cough, shortness of breath and wheezing.   Cardiovascular:  Negative for chest pain, palpitations and leg swelling.  Gastrointestinal:  Negative for abdominal pain, blood in stool, constipation, diarrhea, nausea and vomiting.  Genitourinary:  Negative for dysuria, frequency and hematuria.  Musculoskeletal:  Negative for falls, joint pain and myalgias.  Skin:  Negative for rash.       (-)New moles  Neurological:  Negative for dizziness, loss of consciousness and headaches.  Endo/Heme/Allergies:  Negative for environmental allergies.  Psychiatric/Behavioral:  Negative for depression. The patient is not nervous/anxious.        Objective:    Physical Exam Vitals and nursing note reviewed.  Constitutional:      General: She is not in acute distress.    Appearance: Normal appearance. She is not ill-appearing.  HENT:     Head: Normocephalic and atraumatic.     Right Ear: Tympanic membrane, ear canal and external ear normal.     Left Ear: Tympanic membrane, ear canal and external ear normal.  Eyes:     Extraocular Movements: Extraocular movements intact.     Pupils: Pupils are equal, round, and reactive to light.  Cardiovascular:     Rate and  Rhythm: Normal rate and regular rhythm.     Heart sounds: Normal heart sounds. No murmur heard.   No gallop.  Pulmonary:     Effort: Pulmonary effort is normal. No respiratory distress.     Breath sounds: Normal breath sounds. No wheezing or rales.  Abdominal:     General: Bowel sounds are normal. There is no distension.     Palpations: Abdomen is soft.     Tenderness: There is no abdominal tenderness. There is no guarding.  Skin:    General: Skin is warm and dry.  Neurological:     Mental Status: She is alert and oriented to person, place, and time.  Psychiatric:        Mood and Affect: Mood normal.        Behavior: Behavior normal.        Thought Content: Thought content normal.        Judgment: Judgment normal.    BP 132/74 (BP Location: Left Arm, Patient Position: Sitting, Cuff Size: Normal)   Pulse 72   Temp 98.5 F (36.9 C) (Oral)   Resp 18   Ht 5\' 3"  (1.6 m)   Wt 203 lb (92.1 kg)   LMP 09/24/2005   SpO2 97%   BMI 35.96 kg/m  Wt Readings from Last 3 Encounters:  04/24/21 203 lb (92.1 kg)  01/25/21 203 lb (92.1 kg)  01/18/21 203 lb (92.1 kg)    Diabetic Foot Exam - Simple   No data filed    Lab Results  Component Value Date   WBC 6.4 06/08/2018   HGB 14.0 06/08/2018   HCT 40.8 06/08/2018   PLT 185.0 06/08/2018   GLUCOSE 98 03/28/2020   CHOL 159 03/28/2020   TRIG 161 (H) 03/28/2020   HDL 55 03/28/2020   LDLDIRECT 148.1 04/18/2009    LDLCALC 78 03/28/2020   ALT 19 03/28/2020   AST 19 03/28/2020   NA 138 03/28/2020   K 3.9 03/28/2020   CL 99 03/28/2020   CREATININE 0.96 03/28/2020   BUN 16 03/28/2020   CO2 31 03/28/2020   TSH 1.18 04/03/2015   MICROALBUR 1.2 04/03/2015    Lab Results  Component Value Date   TSH 1.18 04/03/2015   Lab Results  Component Value Date   WBC 6.4 06/08/2018   HGB 14.0 06/08/2018   HCT 40.8 06/08/2018   MCV 85.2 06/08/2018   PLT 185.0 06/08/2018   Lab Results  Component Value Date   NA 138 03/28/2020   K 3.9 03/28/2020   CO2 31 03/28/2020   GLUCOSE 98 03/28/2020   BUN 16 03/28/2020   CREATININE 0.96 03/28/2020   BILITOT 0.5 03/28/2020   ALKPHOS 82 06/08/2018   AST 19 03/28/2020   ALT 19 03/28/2020   PROT 6.6 03/28/2020   ALBUMIN 4.5 06/08/2018   CALCIUM 9.6 03/28/2020   GFR 63.04 06/08/2018   Lab Results  Component Value Date   CHOL 159 03/28/2020   Lab Results  Component Value Date   HDL 55 03/28/2020   Lab Results  Component Value Date   LDLCALC 78 03/28/2020   Lab Results  Component Value Date   TRIG 161 (H) 03/28/2020   Lab Results  Component Value Date   CHOLHDL 2.9 03/28/2020   No results found for: HGBA1C  Mammogram- Last completed 08/21/2020. Results are normal. Repeat in 1 year.  Dexa- Last completed 06/08/2018. Resuls are normal. Repeat in 2 years.  Colonoscopy- Last completed 01/15/2013. Results were normal. Repeat in  10 years.      Assessment & Plan:   Problem List Items Addressed This Visit       Unprioritized   Essential hypertension    Well controlled, no changes to meds. Encouraged heart healthy diet such as the DASH diet and exercise as tolerated.       Relevant Medications   lisinopril-hydrochlorothiazide (ZESTORETIC) 20-25 MG tablet   simvastatin (ZOCOR) 40 MG tablet   Other Relevant Orders   Lipid panel   Comprehensive metabolic panel   CBC with Differential/Platelet   Hyperlipidemia    Encourage heart healthy diet  such as MIND or DASH diet, increase exercise, avoid trans fats, simple carbohydrates and processed foods, consider a krill or fish or flaxseed oil cap daily.       Relevant Medications   lisinopril-hydrochlorothiazide (ZESTORETIC) 20-25 MG tablet   simvastatin (ZOCOR) 40 MG tablet   Preventative health care - Primary    ghm utd Check labs       Other Visit Diagnoses     Hyperlipidemia LDL goal <100       Relevant Medications   lisinopril-hydrochlorothiazide (ZESTORETIC) 20-25 MG tablet   simvastatin (ZOCOR) 40 MG tablet   Other Relevant Orders   Lipid panel   Comprehensive metabolic panel   CBC with Differential/Platelet   Need for influenza vaccination       Relevant Orders   Flu Vaccine QUAD High Dose(Fluad) (Completed)        Meds ordered this encounter  Medications   lisinopril-hydrochlorothiazide (ZESTORETIC) 20-25 MG tablet    Sig: Take 1 tablet by mouth daily.    Dispense:  90 tablet    Refill:  1   simvastatin (ZOCOR) 40 MG tablet    Sig: Take 1 tablet (40 mg total) by mouth daily.    Dispense:  90 tablet    Refill:  1    DX Code Needed  .    I, Dr. Roma Schanz, DO, personally preformed the services described in this documentation.  All medical record entries made by the scribe were at my direction and in my presence.  I have reviewed the chart and discharge instructions (if applicable) and agree that the record reflects my personal performance and is accurate and complete. 04/24/2021   I,Shehryar Baig,acting as a scribe for Ann Held, DO.,have documented all relevant documentation on the behalf of Ann Held, DO,as directed by  Ann Held, DO while in the presence of Ann Held, DO.   Ann Held, DO

## 2021-04-24 NOTE — Assessment & Plan Note (Signed)
ghm utd Check labs  

## 2021-04-24 NOTE — Patient Instructions (Signed)
Preventive Care 70 Years and Older, Female Preventive care refers to lifestyle choices and visits with your health care provider that can promote health and wellness. This includes: A yearly physical exam. This is also called an annual wellness visit. Regular dental and eye exams. Immunizations. Screening for certain conditions. Healthy lifestyle choices, such as: Eating a healthy diet. Getting regular exercise. Not using drugs or products that contain nicotine and tobacco. Limiting alcohol use. What can I expect for my preventive care visit? Physical exam Your health care provider will check your: Height and weight. These may be used to calculate your BMI (body mass index). BMI is a measurement that tells if you are at a healthy weight. Heart rate and blood pressure. Body temperature. Skin for abnormal spots. Counseling Your health care provider may ask you questions about your: Past medical problems. Family's medical history. Alcohol, tobacco, and drug use. Emotional well-being. Home life and relationship well-being. Sexual activity. Diet, exercise, and sleep habits. History of falls. Memory and ability to understand (cognition). Work and work Statistician. Pregnancy and menstrual history. Access to firearms. What immunizations do I need? Vaccines are usually given at various ages, according to a schedule. Your health care provider will recommend vaccines for you based on your age, medical history, and lifestyle or other factors, such as travel or where you work. What tests do I need? Blood tests Lipid and cholesterol levels. These may be checked every 5 years, or more often depending on your overall health. Hepatitis C test. Hepatitis B test. Screening Lung cancer screening. You may have this screening every year starting at age 70 if you have a 30-pack-year history of smoking and currently smoke or have quit within the past 15 years. Colorectal cancer screening. All  adults should have this screening starting at age 70 and continuing until age 9. Your health care provider may recommend screening at age 70 if you are at increased risk. You will have tests every 1-10 years, depending on your results and the type of screening test. Diabetes screening. This is done by checking your blood sugar (glucose) after you have not eaten for a while (fasting). You may have this done every 1-3 years. Mammogram. This may be done every 1-2 years. Talk with your health care provider about how often you should have regular mammograms. Abdominal aortic aneurysm (AAA) screening. You may need this if you are a current or former smoker. BRCA-related cancer screening. This may be done if you have a family history of breast, ovarian, tubal, or peritoneal cancers. Other tests STD (sexually transmitted disease) testing, if you are at risk. Bone density scan. This is done to screen for osteoporosis. You may have this done starting at age 70. Talk with your health care provider about your test results, treatment options, and if necessary, the need for more tests. Follow these instructions at home: Eating and drinking  Eat a diet that includes fresh fruits and vegetables, whole grains, lean protein, and low-fat dairy products. Limit your intake of foods with high amounts of sugar, saturated fats, and salt. Take vitamin and mineral supplements as recommended by your health care provider. Do not drink alcohol if your health care provider tells you not to drink. If you drink alcohol: Limit how much you have to 0-1 drink a day. Be aware of how much alcohol is in your drink. In the [place] one drink equals one 12 oz bottle of beer (355 mL), one 5 oz glass of wine (148 mL), or one  1 oz glass of hard liquor (44 mL). Lifestyle Take daily care of your teeth and gums. Brush your teeth every morning and night with fluoride toothpaste. Floss one time each day. Stay active. Exercise for at least  30 minutes 5 or more days each week. Do not use any products that contain nicotine or tobacco, such as cigarettes, e-cigarettes, and chewing tobacco. If you need help quitting, ask your health care provider. Do not use drugs. If you are sexually active, practice safe sex. Use a condom or other form of protection in order to prevent STIs (sexually transmitted infections). Talk with your health care provider about taking a low-dose aspirin or statin. Find healthy ways to cope with stress, such as: Meditation, yoga, or listening to music. Journaling. Talking to a trusted person. Spending time with friends and family. Safety Always wear your seat belt while driving or riding in a vehicle. Do not drive: If you have been drinking alcohol. Do not ride with someone who has been drinking. When you are tired or distracted. While texting. Wear a helmet and other protective equipment during sports activities. If you have firearms in your house, make sure you follow all gun safety procedures. What's next? Visit your health care provider once a year for an annual wellness visit. Ask your health care provider how often you should have your eyes and teeth checked. Stay up to date on all vaccines. This information is not intended to replace advice given to you by your health care provider. Make sure you discuss any questions you have with your health care provider. Document Revised: 08/25/2020 Document Reviewed: 06/11/2018 Elsevier Patient Education  2022 [employer].

## 2021-04-24 NOTE — Assessment & Plan Note (Signed)
Encourage heart healthy diet such as MIND or DASH diet, increase exercise, avoid trans fats, simple carbohydrates and processed foods, consider a krill or fish or flaxseed oil cap daily.  °

## 2021-05-08 ENCOUNTER — Ambulatory Visit: Payer: Medicare Other | Admitting: Family Medicine

## 2021-05-08 ENCOUNTER — Ambulatory Visit: Payer: Self-pay

## 2021-05-08 ENCOUNTER — Encounter: Payer: Self-pay | Admitting: Family Medicine

## 2021-05-08 VITALS — BP 128/76 | Ht 64.0 in | Wt 203.0 lb

## 2021-05-08 DIAGNOSIS — M1711 Unilateral primary osteoarthritis, right knee: Secondary | ICD-10-CM | POA: Diagnosis not present

## 2021-05-08 NOTE — Progress Notes (Signed)
Heather Pierce - 70 y.o. female MRN [id number]  Date of birth: Nov 25, 1950  SUBJECTIVE:  Including CC & ROS.  No chief complaint on file.   Heather Pierce is a 70 y.o. female that is presenting with acute worsening of the right pain.  Had swelling felt on exam..   Review of Systems See HPI   HISTORY: Past Medical, Surgical, Social, and Family History Reviewed & Updated per EMR.   Pertinent Historical Findings include:  Past Medical History:  Diagnosis Date   Allergy    GERD (gastroesophageal reflux disease)    Hyperlipidemia    Hypertension     Past Surgical History:  Procedure Laterality Date   KNEE ARTHROSCOPY  11/2009   left     Family History  Problem Relation Age of Onset   Jaundice Father    GER disease Father    COPD Father    Dementia Father    Osteoarthritis Father        In nursing home   Cancer Father        melanoma   Alzheimer's disease Father    Colon polyps Father    Alzheimer's disease Mother    Dementia Mother    Arthritis Other    Hyperlipidemia Other    Hypertension Other    Coronary artery disease Other        1st degree relative @60s    Melanoma Other    Colon cancer Maternal Grandmother 63    Social History   Socioeconomic History   Marital status: Single    Spouse name: Not on file   Number of children: Not on file   Years of education: Not on file   Highest education level: Not on file  Occupational History   Occupation: retired    Comment: [employer]    Employer: [employer]  Tobacco Use   Smoking status: Never   Smokeless tobacco: Never  Substance and Sexual Activity   Alcohol use: Yes    Comment: occ. maybe 2 drinks per month per pt.   Drug use: No   Sexual activity: Never    Partners: Male    Birth control/protection: None  Other Topics Concern   Not on file  Social History Narrative   Exercise-- walking dog   Social Determinants of Health   Financial Resource Strain: Low Risk    Difficulty of Paying Living  Expenses: Not hard at all  Food Insecurity: No Food Insecurity   Worried About Charity fundraiser in the Last Year: Never true   [place] in the Last Year: Never true  Transportation Needs: No Transportation Needs   Lack of Transportation (Medical): No   Lack of Transportation (Non-Medical): No  Physical Activity: Sufficiently Active   Days of Exercise per Week: 7 days   Minutes of Exercise per Session: 40 min  Stress: No Stress Concern Present   Feeling of Stress : Not at all  Social Connections: Socially Isolated   Frequency of Communication with Friends and Family: More than three times a week   Frequency of Social Gatherings with Friends and Family: More than three times a week   Attends Religious Services: Never   Marine scientist or Organizations: No   Attends Archivist Meetings: Never   Marital Status: Never married  Human resources officer Violence: Not At Risk   Fear of Current or Ex-Partner: No   Emotionally Abused: No   Physically Abused: No   Sexually Abused: No  PHYSICAL EXAM:  VS: BP 128/76 (BP Location: Left Arm, Patient Position: Sitting)   Ht 5\' 4"  (1.626 m)   Wt 203 lb (92.1 kg)   LMP 09/24/2005   BMI 34.84 kg/m  Physical Exam Gen: NAD, alert, cooperative with exam, well-appearing    Aspiration/Injection Procedure Note Heather Pierce 03/27/1951  Procedure: Aspiration Indications: Right knee pain  Procedure Details Consent: Risks of procedure as well as the alternatives and risks of each were explained to the (patient/caregiver).  Consent for procedure obtained. Time Out: Verified patient identification, verified procedure, site/side was marked, verified correct patient position, special equipment/implants available, medications/allergies/relevent history reviewed, required imaging and test results available.  Performed.  The area was cleaned with iodine and alcohol swabs.    The right knee superior lateral suprapatellar was  injected using 3 cc's of 1% lidocaine with a 25 1 1/2" needle.  An 18-gauge 3-1/2 inch needle was used to achieve aspiration.  Ultrasound was used. Images were obtained in long views showing the injection.    Amount of Fluid Aspirated:  54mL Character of Fluid: clear and straw colored Fluid was sent for: n/a  A sterile dressing was applied.  Patient did tolerate procedure well.      ASSESSMENT & PLAN:   Primary osteoarthritis of right knee Acute on chronic in nature.  - counseled on home exercise therapy and supportive care - aspiration today  - pursue zilretta.

## 2021-05-08 NOTE — Assessment & Plan Note (Signed)
Acute on chronic in nature.  - counseled on home exercise therapy and supportive care - aspiration today  - pursue zilretta.

## 2021-05-08 NOTE — Patient Instructions (Signed)
Good to see you Please use ice as needed   Please send me a message in MyChart with any questions or updates We'll call you when the zilretta is in.   --Dr. Raeford Razor

## 2021-05-14 ENCOUNTER — Ambulatory Visit: Payer: Medicare Other | Admitting: Family Medicine

## 2021-05-14 ENCOUNTER — Ambulatory Visit: Payer: Self-pay

## 2021-05-14 ENCOUNTER — Encounter: Payer: Self-pay | Admitting: Family Medicine

## 2021-05-14 DIAGNOSIS — M1711 Unilateral primary osteoarthritis, right knee: Secondary | ICD-10-CM

## 2021-05-14 NOTE — Assessment & Plan Note (Signed)
Completed zilretta injection.  ?

## 2021-05-14 NOTE — Patient Instructions (Signed)
Good to see you Please use ice as needed   Please send me a message in MyChart with any questions or updates.  Please see me back in 4-6 weeks.   --Dr. Demisha Nokes  

## 2021-05-14 NOTE — Progress Notes (Signed)
Heather Pierce - 70 y.o. female MRN [id number]  Date of birth: 01/11/1951  SUBJECTIVE:  Including CC & ROS.  No chief complaint on file.   Heather Pierce is a 70 y.o. female that is  here for zilretta injection.   Review of Systems See HPI   HISTORY: Past Medical, Surgical, Social, and Family History Reviewed & Updated per EMR.   Pertinent Historical Findings include:  Past Medical History:  Diagnosis Date   Allergy    GERD (gastroesophageal reflux disease)    Hyperlipidemia    Hypertension     Past Surgical History:  Procedure Laterality Date   KNEE ARTHROSCOPY  11/2009   left     Family History  Problem Relation Age of Onset   Jaundice Father    GER disease Father    COPD Father    Dementia Father    Osteoarthritis Father        In nursing home   Cancer Father        melanoma   Alzheimer's disease Father    Colon polyps Father    Alzheimer's disease Mother    Dementia Mother    Arthritis Other    Hyperlipidemia Other    Hypertension Other    Coronary artery disease Other        1st degree relative @60s    Melanoma Other    Colon cancer Maternal Grandmother 5    Social History   Socioeconomic History   Marital status: Single    Spouse name: Not on file   Number of children: Not on file   Years of education: Not on file   Highest education level: Not on file  Occupational History   Occupation: retired    Comment: [employer]    Employer: [employer]  Tobacco Use   Smoking status: Never   Smokeless tobacco: Never  Substance and Sexual Activity   Alcohol use: Yes    Comment: occ. maybe 2 drinks per month per pt.   Drug use: No   Sexual activity: Never    Partners: Male    Birth control/protection: None  Other Topics Concern   Not on file  Social History Narrative   Exercise-- walking dog   Social Determinants of Health   Financial Resource Strain: Low Risk    Difficulty of Paying Living Expenses: Not hard at all  Food Insecurity: No Food  Insecurity   Worried About Charity fundraiser in the Last Year: Never true   [place] in the Last Year: Never true  Transportation Needs: No Transportation Needs   Lack of Transportation (Medical): No   Lack of Transportation (Non-Medical): No  Physical Activity: Sufficiently Active   Days of Exercise per Week: 7 days   Minutes of Exercise per Session: 40 min  Stress: No Stress Concern Present   Feeling of Stress : Not at all  Social Connections: Socially Isolated   Frequency of Communication with Friends and Family: More than three times a week   Frequency of Social Gatherings with Friends and Family: More than three times a week   Attends Religious Services: Never   Marine scientist or Organizations: No   Attends Music therapist: Never   Marital Status: Never married  Human resources officer Violence: Not At Risk   Fear of Current or Ex-Partner: No   Emotionally Abused: No   Physically Abused: No   Sexually Abused: No     PHYSICAL EXAM:  VS: BP  130/86 (BP Location: Left Arm, Patient Position: Sitting)   Ht 5\' 4"  (1.626 m)   Wt 203 lb (92.1 kg)   LMP 09/24/2005   BMI 34.84 kg/m  Physical Exam Gen: NAD, alert, cooperative with exam, well-appearing   Aspiration/Injection Procedure Note Heather Pierce 23-Jul-1950  Procedure: Aspiration and Injection Indications: right knee pain   Procedure Details Consent: Risks of procedure as well as the alternatives and risks of each were explained to the (patient/caregiver).  Consent for procedure obtained. Time Out: Verified patient identification, verified procedure, site/side was marked, verified correct patient position, special equipment/implants available, medications/allergies/relevent history reviewed, required imaging and test results available.  Performed.  The area was cleaned with iodine and alcohol swabs.    The right knee superior lateral suprapatellar pouch was injected using 3 cc of 1% lidocaine on  a 25-gauge 1-1/2 inch needle.  An 18-gauge 1-1/2 needle was used to achieve aspiration.  The syringe was switched and a mixture containing 5 cc's of 32 mg Zilretta and 4 cc's of 0.25% bupivacaine was injected.  Ultrasound was used. Images were obtained in long views showing the injection.   Amount of Fluid Aspirated:  12mL Character of Fluid: clear and straw colored Fluid was sent for: n/a  A sterile dressing was applied.  Patient did tolerate procedure well.     ASSESSMENT & PLAN:   Primary osteoarthritis of right knee Completed zilretta injection

## 2021-05-17 ENCOUNTER — Ambulatory Visit: Payer: Medicare Other | Attending: Internal Medicine

## 2021-05-17 DIAGNOSIS — Z23 Encounter for immunization: Secondary | ICD-10-CM

## 2021-05-17 NOTE — Progress Notes (Signed)
   Covid-19 Vaccination Clinic  Name:  Lasaundra Riche    MRN: [id number] DOB: 1950-09-01  05/17/2021  Ms. Hollings was observed post Covid-19 immunization for 15 minutes without incident. She was provided with Vaccine Information Sheet and instruction to access the V-Safe system.   Ms. Weinfeld was instructed to call 911 with any severe reactions post vaccine: Difficulty breathing  Swelling of face and throat  A fast heartbeat  A bad rash all over body  Dizziness and weakness   Immunizations Administered     Name Date Dose VIS Date Route   Pfizer Covid-19 Vaccine Bivalent Booster 05/17/2021 10:41 AM 0.3 mL 02/28/2021 Intramuscular   Manufacturer: [place]   Lot: [id number]   [place]: [phone]

## 2021-06-02 ENCOUNTER — Other Ambulatory Visit (HOSPITAL_BASED_OUTPATIENT_CLINIC_OR_DEPARTMENT_OTHER): Payer: Self-pay

## 2021-06-02 MED ORDER — PFIZER COVID-19 VAC BIVALENT 30 MCG/0.3ML IM SUSP
INTRAMUSCULAR | 0 refills | Status: DC
  Filled 2021-06-02: qty 0.3, 1d supply, fill #0

## 2021-06-04 ENCOUNTER — Ambulatory Visit: Payer: Medicare Other

## 2021-06-04 ENCOUNTER — Other Ambulatory Visit (HOSPITAL_BASED_OUTPATIENT_CLINIC_OR_DEPARTMENT_OTHER): Payer: Self-pay

## 2021-06-07 ENCOUNTER — Ambulatory Visit: Payer: Medicare Other

## 2021-06-11 ENCOUNTER — Ambulatory Visit: Payer: Medicare Other

## 2021-07-24 ENCOUNTER — Ambulatory Visit: Payer: Medicare Other | Admitting: Family Medicine

## 2021-07-24 ENCOUNTER — Ambulatory Visit: Payer: Self-pay

## 2021-07-24 ENCOUNTER — Encounter: Payer: Self-pay | Admitting: Family Medicine

## 2021-07-24 VITALS — BP 122/78 | Ht 64.0 in | Wt 203.0 lb

## 2021-07-24 DIAGNOSIS — M1711 Unilateral primary osteoarthritis, right knee: Secondary | ICD-10-CM | POA: Diagnosis not present

## 2021-07-24 MED ORDER — KETOROLAC TROMETHAMINE 30 MG/ML IJ SOLN
30.0000 mg | Freq: Once | INTRAMUSCULAR | Status: AC
  Administered 2021-07-24: 10:00:00 30 mg via INTRA_ARTICULAR

## 2021-07-24 NOTE — Progress Notes (Signed)
°  Heather Pierce - 71 y.o. female MRN [id number]  Date of birth: 03/10/1951  SUBJECTIVE:  Including CC & ROS.  No chief complaint on file.   Heather Pierce is a 71 y.o. female that is presenting with acute right knee pain.  The pain is started over the past couple weeks.  No injury inciting event.  Having pain over the medial compartment and over the lateral aspect.    Review of Systems See HPI   HISTORY: Past Medical, Surgical, Social, and Family History Reviewed & Updated per EMR.   Pertinent Historical Findings include:  Past Medical History:  Diagnosis Date   Allergy    GERD (gastroesophageal reflux disease)    Hyperlipidemia    Hypertension     Past Surgical History:  Procedure Laterality Date   KNEE ARTHROSCOPY  11/2009   left      PHYSICAL EXAM:  VS: BP 122/78 (BP Location: Left Arm, Patient Position: Sitting)    Ht 5\' 4"  (1.626 m)    Wt 203 lb (92.1 kg)    LMP 09/24/2005    BMI 34.84 kg/m  Physical Exam Gen: NAD, alert, cooperative with exam, well-appearing MSK:  Neurovascularly intact     Aspiration/Injection Procedure Note Heather Pierce 03/01/1951  Procedure: Aspiration and Injection Indications: Right knee pain  Procedure Details Consent: Risks of procedure as well as the alternatives and risks of each were explained to the (patient/caregiver).  Consent for procedure obtained. Time Out: Verified patient identification, verified procedure, site/side was marked, verified correct patient position, special equipment/implants available, medications/allergies/relevent history reviewed, required imaging and test results available.  Performed.  The area was cleaned with iodine and alcohol swabs.    The right knee superior lateral suprapatellar pouch was injected using 3 cc 1% lidocaine on a 25-gauge 1-1/2 inch needle.  An 18-gauge 1-1/2 needle was used to achieve aspiration.  The syringe was switched to mixture containing 1 cc's of 30 mg Toradol and 4 cc's of 0.25%  bupivacaine was injected.  Ultrasound was used. Images were obtained in long views showing the injection.    Amount of Fluid Aspirated:  74mL Character of Fluid: clear and straw colored Fluid was sent for: n/a  A sterile dressing was applied.  Patient did tolerate procedure well.     ASSESSMENT & PLAN:   Primary osteoarthritis of right knee Acute on chronic in nature.  Has done well until recently. -Counseled on home exercise therapy and supportive care. -Injection today. -Pursue gel injection.

## 2021-07-24 NOTE — Assessment & Plan Note (Signed)
Acute on chronic in nature.  Has done well until recently. -Counseled on home exercise therapy and supportive care. -Injection today. -Pursue gel injection.

## 2021-07-24 NOTE — Patient Instructions (Signed)
Good to see you Please use ice as needed   Please send me a message in MyChart with any questions or updates.  We'll call you when the gel injection is in.   --Dr. Raeford Razor

## 2021-07-27 ENCOUNTER — Encounter: Payer: Self-pay | Admitting: Family Medicine

## 2021-07-27 ENCOUNTER — Telehealth: Payer: Self-pay | Admitting: *Deleted

## 2021-07-27 NOTE — Telephone Encounter (Signed)
I received patient's SOB from [id number] for Durolane right knee gel injection. Her plan covers 80% of the cost of Durolane, they cover 100% of the admin fee and she owes a $30 office copay. So, her total approximate OOP is $450. Patient informed of this. She will contact her plan to confirm this and contact us when she is ready to schedule.

## 2021-07-30 NOTE — Telephone Encounter (Signed)
#  1 Durolane gel inj ordered.

## 2021-07-31 ENCOUNTER — Encounter: Payer: Self-pay | Admitting: Family Medicine

## 2021-08-02 ENCOUNTER — Encounter: Payer: Self-pay | Admitting: Family Medicine

## 2021-08-02 ENCOUNTER — Ambulatory Visit: Payer: Medicare Other | Admitting: Family Medicine

## 2021-08-02 ENCOUNTER — Ambulatory Visit: Payer: Self-pay

## 2021-08-02 VITALS — BP 130/80 | Ht 64.0 in | Wt 203.0 lb

## 2021-08-02 DIAGNOSIS — M1711 Unilateral primary osteoarthritis, right knee: Secondary | ICD-10-CM

## 2021-08-02 NOTE — Patient Instructions (Signed)
Good to see you Please try ice as needed   Please send me a message in MyChart with any questions or updates.  Please see me back in 4 weeks or as needed in a few months if better.   --Dr. Raeford Razor

## 2021-08-02 NOTE — Assessment & Plan Note (Signed)
Completed gel injection today.

## 2021-08-02 NOTE — Progress Notes (Signed)
°  Heather Pierce - 71 y.o. female MRN [id number]  Date of birth: [birth year]  SUBJECTIVE:  Including CC & ROS.  No chief complaint on file.   Heather Pierce is a 71 y.o. female that is here for gel injection.   Review of Systems See HPI   HISTORY: Past Medical, Surgical, Social, and Family History Reviewed & Updated per EMR.   Pertinent Historical Findings include:  Past Medical History:  Diagnosis Date   Allergy    GERD (gastroesophageal reflux disease)    Hyperlipidemia    Hypertension     Past Surgical History:  Procedure Laterality Date   KNEE ARTHROSCOPY  11/2009   left      PHYSICAL EXAM:  VS: BP 130/80 (BP Location: Left Arm, Patient Position: Sitting)    Ht 5\' 4"  (1.626 m)    Wt 203 lb (92.1 kg)    LMP 09/24/2005    BMI 34.84 kg/m  Physical Exam Gen: NAD, alert, cooperative with exam, well-appearing MSK:  Neurovascularly intact      Aspiration/Injection Procedure Note Heather Pierce 1951-05-17  Procedure: Aspiration and Injection Indications: right knee pain   Procedure Details Consent: Risks of procedure as well as the alternatives and risks of each were explained to the (patient/caregiver).  Consent for procedure obtained. Time Out: Verified patient identification, verified procedure, site/side was marked, verified correct patient position, special equipment/implants available, medications/allergies/relevent history reviewed, required imaging and test results available.  Performed.  The area was cleaned with iodine and alcohol swabs.    The right knee superior lateral suprapatellar pouch was injected using 4 cc's of 1% lidocaine with a 21  2" needle.  An 18-gauge 1-1/2 inch needle was used to achieve aspiration.  The syringe was switched and the syringe was switched and a 60 mg  / 3 mL of durolane was injected. Ultrasound was used. Images were obtained in  Long views showing the injection.    Amount of Fluid Aspirated:  70mL Character of Fluid: clear and  straw colored Fluid was sent for: n/a A sterile dressing was applied.  Patient did tolerate procedure well.      ASSESSMENT & PLAN:   Primary osteoarthritis of right knee Completed gel injection today.

## 2021-08-21 ENCOUNTER — Encounter: Payer: Self-pay | Admitting: Family Medicine

## 2021-08-31 ENCOUNTER — Encounter: Payer: Self-pay | Admitting: Family Medicine

## 2021-08-31 DIAGNOSIS — Z13820 Encounter for screening for osteoporosis: Secondary | ICD-10-CM

## 2021-08-31 NOTE — Telephone Encounter (Signed)
Okay to order bone density?  ?

## 2021-09-04 ENCOUNTER — Encounter: Payer: Self-pay | Admitting: Family Medicine

## 2021-09-04 ENCOUNTER — Ambulatory Visit (INDEPENDENT_AMBULATORY_CARE_PROVIDER_SITE_OTHER): Payer: Medicare Other | Admitting: Family Medicine

## 2021-09-04 ENCOUNTER — Other Ambulatory Visit (HOSPITAL_BASED_OUTPATIENT_CLINIC_OR_DEPARTMENT_OTHER): Payer: Self-pay | Admitting: Family Medicine

## 2021-09-04 ENCOUNTER — Ambulatory Visit: Payer: Self-pay

## 2021-09-04 VITALS — BP 130/70 | Ht 64.0 in | Wt 203.0 lb

## 2021-09-04 DIAGNOSIS — M1711 Unilateral primary osteoarthritis, right knee: Secondary | ICD-10-CM

## 2021-09-04 DIAGNOSIS — Z1231 Encounter for screening mammogram for malignant neoplasm of breast: Secondary | ICD-10-CM

## 2021-09-04 NOTE — Progress Notes (Signed)
?  Heather Pierce - 71 y.o. female MRN [id number]  Date of birth: 1950/09/09 ? ?SUBJECTIVE:  Including CC & ROS.  ?No chief complaint on file. ? ? ?Heather Pierce is a 71 y.o. female that is presenting with acute on chronic right knee pain.  The pain has gotten worse over the past few weeks.  Having significant swelling. ? ? ?Review of Systems ?See HPI  ? ?HISTORY: Past Medical, Surgical, Social, and Family History Reviewed & Updated per EMR.   ?Pertinent Historical Findings include: ? ?Past Medical History:  ?Diagnosis Date  ? Allergy   ? GERD (gastroesophageal reflux disease)   ? Hyperlipidemia   ? Hypertension   ? ? ?Past Surgical History:  ?Procedure Laterality Date  ? KNEE ARTHROSCOPY  11/2009  ? left   ? ? ? ?PHYSICAL EXAM:  ?VS: BP 130/70 (BP Location: Left Arm, Patient Position: Sitting)   Ht '5\' 4"'$  (1.626 m)   Wt 203 lb (92.1 kg)   LMP 09/24/2005   BMI 34.84 kg/m?  ?Physical Exam ?Gen: NAD, alert, cooperative with exam, well-appearing ?MSK:  ?Neurovascularly intact   ? ? ?Aspiration/Injection Procedure Note ?Heather Pierce ?Aug 06, 1950 ? ?Procedure: Aspiration ?Indications: Right knee pain ? ?Procedure Details ?Consent: Risks of procedure as well as the alternatives and risks of each were explained to the (patient/caregiver).  Consent for procedure obtained. ?Time Out: Verified patient identification, verified procedure, site/side was marked, verified correct patient position, special equipment/implants available, medications/allergies/relevent history reviewed, required imaging and test results available.  Performed.  The area was cleaned with iodine and alcohol swabs.   ? ?The right knee superolateral suprapatellar pouch was injected using 4 cc's of 1% lidocaine with a 22 1 1/2" needle.  An 18-gauge 1-1/2 inch needle was used to achieve aspiration.  Ultrasound was used. Images were obtained in long views showing the injection.   ? ?Amount of Fluid Aspirated:  74m ?Character of Fluid: clear and straw  colored ?Fluid was sent for: n/a ? ?A sterile dressing was applied. ? ?Patient did tolerate procedure well. ? ? ? ? ?ASSESSMENT & PLAN:  ? ?Primary osteoarthritis of right knee ?Acutely occurring.  Did not seem to get much relief with previous gel injection. ?-Counseled on home exercise therapy and supportive care. ?-Aspiration today. ?-Could consider Zilretta. ? ? ? ? ?

## 2021-09-04 NOTE — Patient Instructions (Addendum)
Good to see you ?Please use ice as needed  ?Please try compression   ?Please send me a message in MyChart with any questions or updates.  ?Please see me back in 4-6 weeks.  ? ?--Dr. Raeford Razor ? ?Completed zilretta injection 05/04/21.  ?Toradol injection 07/24/21.  ?Durolane injection 08/02/21.  ?

## 2021-09-04 NOTE — Assessment & Plan Note (Signed)
Acutely occurring.  Did not seem to get much relief with previous gel injection. ?-Counseled on home exercise therapy and supportive care. ?-Aspiration today. ?-Could consider Zilretta. ?

## 2021-09-05 ENCOUNTER — Encounter: Payer: Self-pay | Admitting: Family Medicine

## 2021-09-10 ENCOUNTER — Encounter: Payer: Self-pay | Admitting: Family Medicine

## 2021-09-11 ENCOUNTER — Other Ambulatory Visit: Payer: Self-pay

## 2021-09-11 ENCOUNTER — Ambulatory Visit (HOSPITAL_BASED_OUTPATIENT_CLINIC_OR_DEPARTMENT_OTHER)
Admission: RE | Admit: 2021-09-11 | Discharge: 2021-09-11 | Disposition: A | Discharge status: Home / Self Care | Payer: Medicare Other | Source: Ambulatory Visit | Attending: Family Medicine | Admitting: Family Medicine

## 2021-09-11 ENCOUNTER — Encounter (HOSPITAL_BASED_OUTPATIENT_CLINIC_OR_DEPARTMENT_OTHER): Payer: Self-pay

## 2021-09-11 DIAGNOSIS — Z1231 Encounter for screening mammogram for malignant neoplasm of breast: Secondary | ICD-10-CM | POA: Insufficient documentation

## 2021-09-11 DIAGNOSIS — Z780 Asymptomatic menopausal state: Secondary | ICD-10-CM | POA: Diagnosis not present

## 2021-09-11 DIAGNOSIS — Z13820 Encounter for screening for osteoporosis: Secondary | ICD-10-CM | POA: Diagnosis not present

## 2021-09-11 IMAGING — MG MM DIGITAL SCREENING BILAT W/ TOMO AND CAD
8 series · 8 of 24 positions shown · non-contrast
Comparison: Previous exam(s).

CLINICAL DATA: Screening.

EXAM:
DIGITAL SCREENING BILATERAL MAMMOGRAM WITH TOMOSYNTHESIS AND CAD
TECHNIQUE: Bilateral screening digital craniocaudal and mediolateral oblique
mammograms were obtained. Bilateral screening digital breast
tomosynthesis was performed. The images were evaluated with
computer-aided detection.

[R MLO synth-2D]
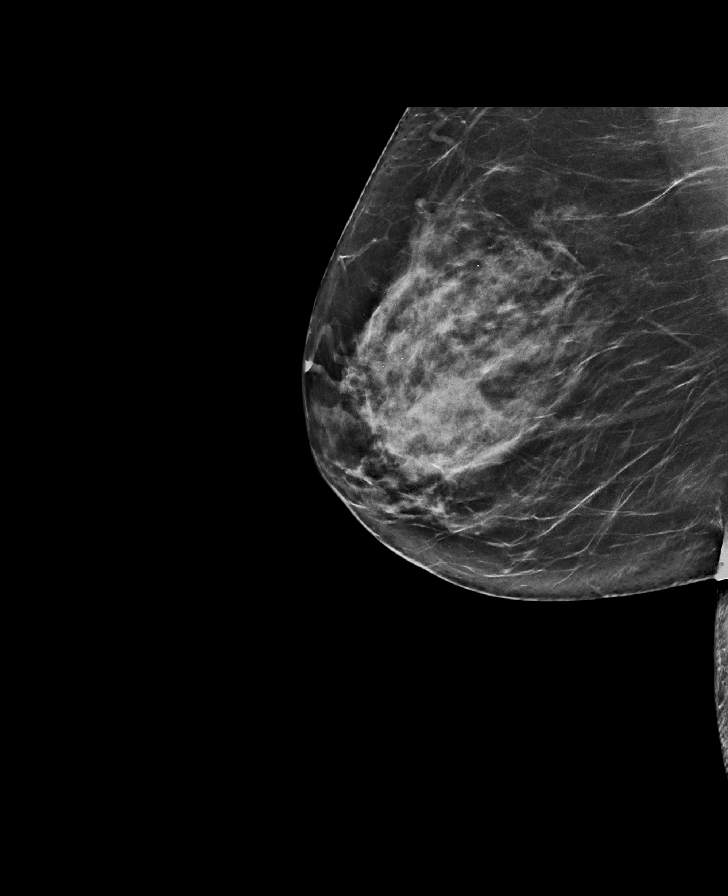

[L MLO synth-2D]
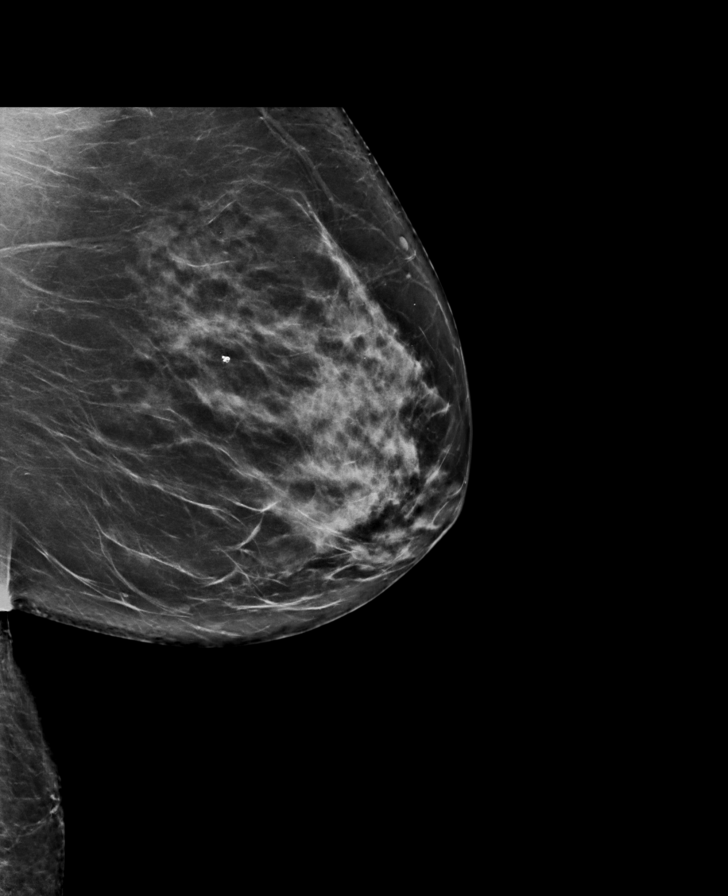

[R CC synth-2D]
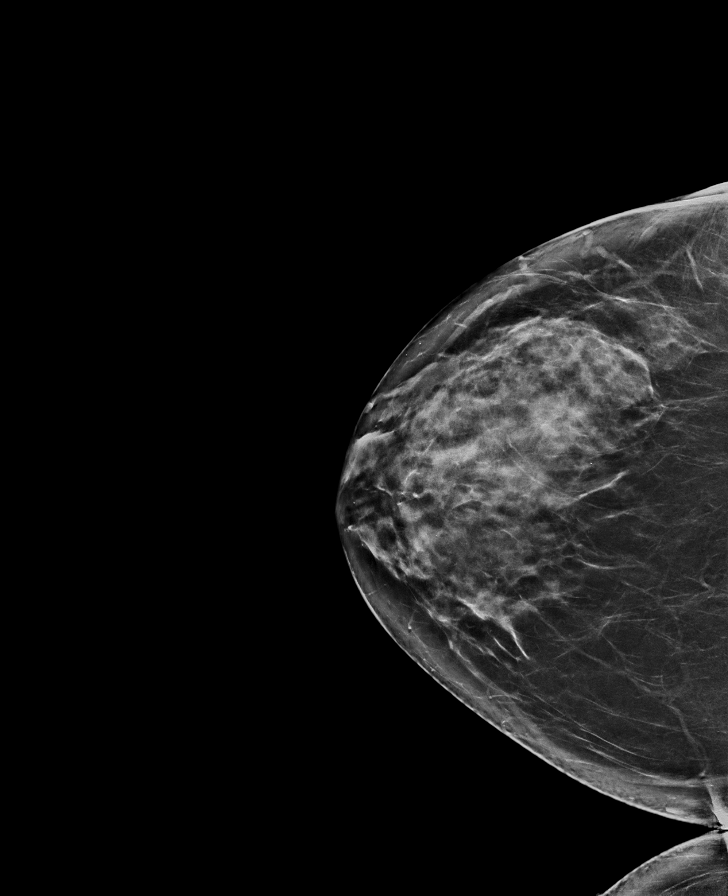

[L CC synth-2D]
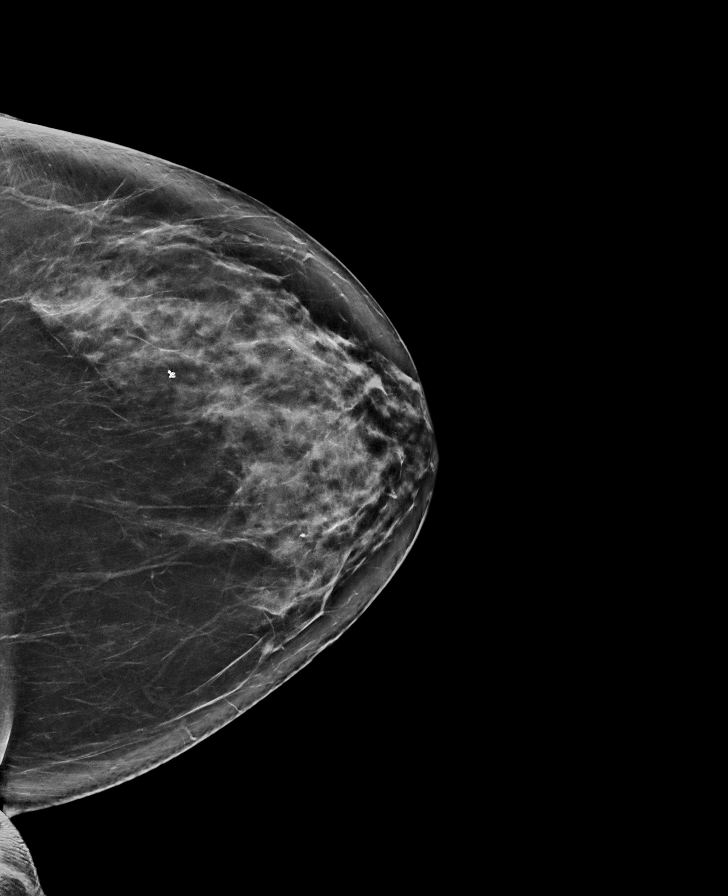

[R CC tomo · tomo slice 39/77.0]
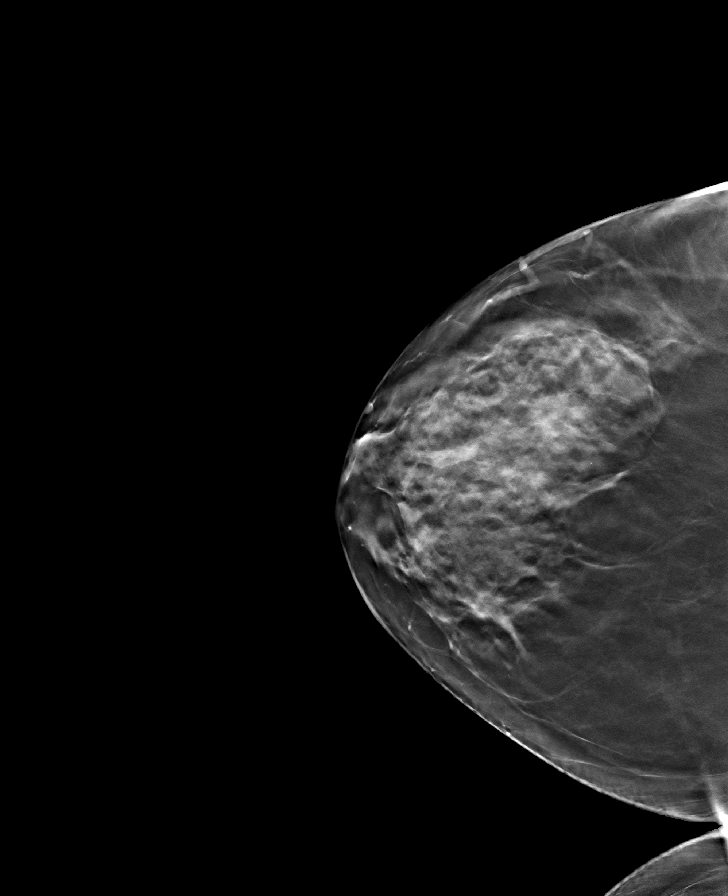

[L MLO tomo · tomo slice 41/82.0]
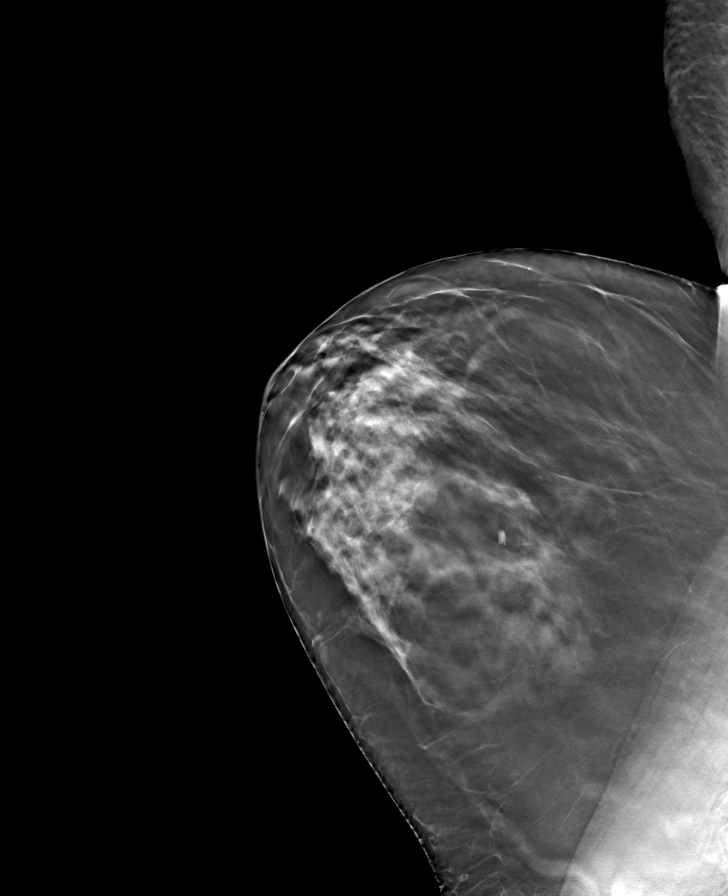

[L CC tomo · tomo slice 39/78.0]
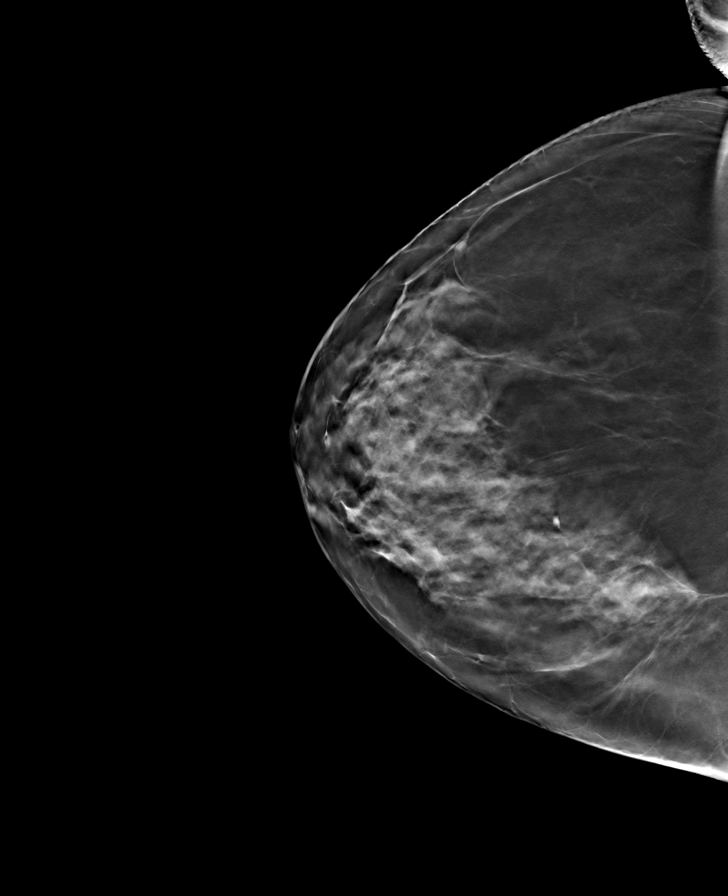

[R MLO tomo · tomo slice 37/74.0]
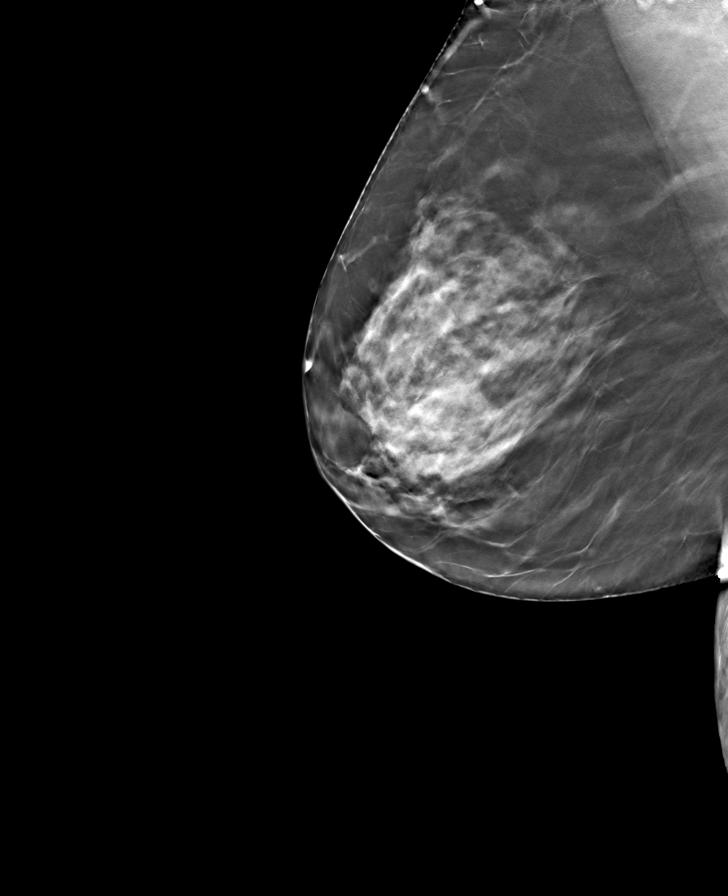

[8 of 24 positions shown; findings below may reference images not displayed]

ACR Breast Density Category c: The breast tissue is heterogeneously
dense, which may obscure small masses.
FINDINGS: There are no findings suspicious for malignancy.
IMPRESSION: No mammographic evidence of malignancy. A result letter of this
screening mammogram will be mailed directly to the patient.

RECOMMENDATION:
Screening mammogram in one year. (Code:[V2])

BI-RADS CATEGORY  1: Negative.

## 2021-09-13 ENCOUNTER — Encounter: Payer: Self-pay | Admitting: Family Medicine

## 2021-09-13 ENCOUNTER — Ambulatory Visit: Payer: Medicare Other | Admitting: Family Medicine

## 2021-09-13 ENCOUNTER — Ambulatory Visit: Payer: Self-pay

## 2021-09-13 VITALS — BP 122/70 | Ht 64.0 in | Wt 203.0 lb

## 2021-09-13 DIAGNOSIS — M1711 Unilateral primary osteoarthritis, right knee: Secondary | ICD-10-CM

## 2021-09-13 NOTE — Assessment & Plan Note (Signed)
Completed aspiration and zilretta injection today.  ?

## 2021-09-13 NOTE — Progress Notes (Signed)
?  Heather Pierce - 71 y.o. female MRN [id number]  Date of birth: 04/19/1951 ? ?SUBJECTIVE:  Including CC & ROS.  ?No chief complaint on file. ? ? ?Heather Pierce is a 71 y.o. female that is  here for zilretta injection. ? ? ?Review of Systems ?See HPI  ? ?HISTORY: Past Medical, Surgical, Social, and Family History Reviewed & Updated per EMR.   ?Pertinent Historical Findings include: ? ?Past Medical History:  ?Diagnosis Date  ? Allergy   ? GERD (gastroesophageal reflux disease)   ? Hyperlipidemia   ? Hypertension   ? ? ?Past Surgical History:  ?Procedure Laterality Date  ? KNEE ARTHROSCOPY  11/2009  ? left   ? ? ? ?PHYSICAL EXAM:  ?VS: BP 122/70 (BP Location: Left Arm, Patient Position: Sitting)   Ht '5\' 4"'$  (1.626 m)   Wt 203 lb (92.1 kg)   LMP 09/24/2005   BMI 34.84 kg/m?  ?Physical Exam ?Gen: NAD, alert, cooperative with exam, well-appearing ?MSK:  ?Neurovascularly intact   ? ? ?Aspiration/Injection Procedure Note ?Seward Speck ?02-16-1951 ? ?Procedure: Aspiration and Injection ?Indications: right knee pain ? ?Procedure Details ?Consent: Risks of procedure as well as the alternatives and risks of each were explained to the (patient/caregiver).  Consent for procedure obtained. ?Time Out: Verified patient identification, verified procedure, site/side was marked, verified correct patient position, special equipment/implants available, medications/allergies/relevent history reviewed, required imaging and test results available.  Performed.  The area was cleaned with iodine and alcohol swabs.   ? ?The right knee superior lateral suprapatellar pouch was injected using 3 cc of 1% lidocaine on a 25-gauge 1-1/2 inch needle.  An 18-gauge 1-1/2 needle was used to achieve aspiration.  The syringe was switched and a mixture containing 5 cc's of 32 mg Zilretta and 4 cc's of 0.25% bupivacaine was injected.  Ultrasound was used. Images were obtained in long views showing the injection.  ? ?Amount of Fluid Aspirated:   71m ?Character of Fluid: clear and straw colored ?Fluid was sent for: n/a ? ?A sterile dressing was applied. ? ?Patient did tolerate procedure well. ? ?  ? ? ? ?ASSESSMENT & PLAN:  ? ?Primary osteoarthritis of right knee ?Completed aspiration and zilretta injection today.  ? ? ? ? ?

## 2021-10-17 ENCOUNTER — Other Ambulatory Visit: Payer: Self-pay | Admitting: Family Medicine

## 2021-10-17 DIAGNOSIS — I10 Essential (primary) hypertension: Secondary | ICD-10-CM

## 2021-11-12 ENCOUNTER — Encounter: Payer: Self-pay | Admitting: Family Medicine

## 2021-11-15 ENCOUNTER — Ambulatory Visit (INDEPENDENT_AMBULATORY_CARE_PROVIDER_SITE_OTHER): Payer: Medicare Other | Admitting: Family Medicine

## 2021-11-15 ENCOUNTER — Encounter: Payer: Self-pay | Admitting: Family Medicine

## 2021-11-15 VITALS — BP 132/70 | HR 84 | Temp 98.1°F | Resp 18 | Ht 64.0 in | Wt 204.4 lb

## 2021-11-15 DIAGNOSIS — C439 Malignant melanoma of skin, unspecified: Secondary | ICD-10-CM

## 2021-11-15 DIAGNOSIS — L570 Actinic keratosis: Secondary | ICD-10-CM

## 2021-11-15 NOTE — Assessment & Plan Note (Signed)
rto for removal

## 2021-11-15 NOTE — Progress Notes (Signed)
Established Patient Office Visit  Subjective   Patient ID: Heather Pierce, female    DOB: 05/05/1951  Age: 71 y.o. MRN: [id number]  Chief Complaint  Patient presents with   Mass    Pt states having a growth on groin area and states she showed her Derm. Pt states she knocked while washing and it started bleeding and states no pain.     HPI  Patient Active Problem List   Diagnosis Date Noted   Melanoma of skin ([place]) 11/15/2021   Acute pain of left knee 05/31/2019   Primary osteoarthritis of right knee 05/03/2019   Obesity (BMI 30-39.9) 07/15/2013   Hyperlipidemia 09/25/2010   Preventative health care 09/25/2010   GERD 03/15/2008   Essential hypertension 12/17/2007   Past Medical History:  Diagnosis Date   Allergy    GERD (gastroesophageal reflux disease)    Hyperlipidemia    Hypertension    Past Surgical History:  Procedure Laterality Date   KNEE ARTHROSCOPY  11/2009   left    Social History   Socioeconomic History   Marital status: Single    Spouse name: Not on file   Number of children: Not on file   Years of education: Not on file   Highest education level: Not on file  Occupational History   Occupation: retired    Comment: [employer]    Employer: [employer]  Tobacco Use   Smoking status: Never   Smokeless tobacco: Never  Substance and Sexual Activity   Alcohol use: Yes    Comment: occ. maybe 2 drinks per month per pt.   Drug use: No   Sexual activity: Never    Partners: Male    Birth control/protection: None  Other Topics Concern   Not on file  Social History Narrative   Exercise-- walking dog   Social Determinants of Health   Financial Resource Strain: Not on file  Food Insecurity: Not on file  Transportation Needs: Not on file  Physical Activity: Not on file  Stress: Not on file  Social Connections: Not on file  Intimate Partner Violence: Not on file   Family Status  Relation Name Status   Father  Deceased   Mother  Deceased   Other   (Not Specified)   Other  (Not Specified)   Other  (Not Specified)   Other  (Not Specified)   Other  (Not Specified)   MGM  (Not Specified)   Family History  Problem Relation Age of Onset   Jaundice Father    GER disease Father    COPD Father    Dementia Father    Osteoarthritis Father        In nursing home   Cancer Father        melanoma   Alzheimer's disease Father    Colon polyps Father    Alzheimer's disease Mother    Dementia Mother    Arthritis Other    Hyperlipidemia Other    Hypertension Other    Coronary artery disease Other        1st degree relative '@60s'$    Melanoma Other    Colon cancer Maternal Grandmother 60   No Known Allergies    Review of Systems  Constitutional:  Negative for fever and malaise/fatigue.  HENT:  Negative for congestion.   Eyes:  Negative for blurred vision.  Respiratory:  Negative for cough and shortness of breath.   Cardiovascular:  Negative for chest pain, palpitations and leg swelling.  Gastrointestinal:  Negative for vomiting.  Musculoskeletal:  Negative for back pain.  Skin:  Negative for rash.  Neurological:  Negative for loss of consciousness and headaches.     Objective:     BP 132/70 (BP Location: Left Arm, Patient Position: Sitting, Cuff Size: Large)   Pulse 84   Temp 98.1 F (36.7 C) (Oral)   Resp 18   Ht '5\' 4"'$  (1.626 m)   Wt 204 lb 6.4 oz (92.7 kg)   LMP 09/24/2005   SpO2 97%   BMI 35.09 kg/m  BP Readings from Last 3 Encounters:  11/15/21 132/70  09/13/21 122/70  09/04/21 130/70   Wt Readings from Last 3 Encounters:  11/15/21 204 lb 6.4 oz (92.7 kg)  09/13/21 203 lb (92.1 kg)  09/04/21 203 lb (92.1 kg)   SpO2 Readings from Last 3 Encounters:  11/15/21 97%  04/24/21 97%  06/01/20 97%      Physical Exam Vitals and nursing note reviewed.  Constitutional:      Appearance: She is well-developed.  HENT:     Head: Normocephalic and atraumatic.  Eyes:     Conjunctiva/sclera: Conjunctivae normal.   Neck:     Thyroid: No thyromegaly.     Vascular: No carotid bruit or JVD.  Cardiovascular:     Rate and Rhythm: Normal rate and regular rhythm.     Heart sounds: Normal heart sounds. No murmur heard. Pulmonary:     Effort: Pulmonary effort is normal. No respiratory distress.     Breath sounds: Normal breath sounds. No wheezing or rales.  Chest:     Chest wall: No tenderness.  Musculoskeletal:     Cervical back: Normal range of motion and neck supple.  Skin:    Findings: Lesion present.          Comments: + dark lesion about 1 1/2 in diameter on stalk  No bleeding now  Non tender   Neurological:     Mental Status: She is alert and oriented to person, place, and time.  Psychiatric:        Mood and Affect: Mood normal.        Behavior: Behavior normal.        Thought Content: Thought content normal.        Judgment: Judgment normal.     No results found for any visits on 11/15/21.    The 10-year ASCVD risk score (Arnett DK, et al., 2019) is: 12.1%    Assessment & Plan:   Problem List Items Addressed This Visit       Unprioritized   Melanoma of skin ([place]) - Primary    Return in about 1 week (around 11/22/2021), or +++++++++++++++.    Ann Held, DO

## 2021-11-15 NOTE — Patient Instructions (Signed)
Seborrheic Keratosis A seborrheic keratosis is a common, noncancerous (benign) skin growth. These growths are velvety, waxy, rough, tan, brown, or black spots that appear on the skin. These skin growths can be flat or raised, and scaly. What are the causes? The cause of this condition is not known. What increases the risk? You are more likely to develop this condition if you: Have a family history of seborrheic keratosis. Are 50 or older. Are pregnant. Have had estrogen replacement therapy. What are the signs or symptoms? Symptoms of this condition include growths on the face, chest, shoulders, back, or other areas. These growths: Are usually painless, but may become irritated and itchy. Can be yellow, brown, black, or other colors. Are slightly raised or have a flat surface. Are sometimes rough or wart-like in texture. Are often velvety or waxy on the surface. Are round or oval-shaped. Often occur in groups, but may occur as a single growth. How is this diagnosed? This condition is diagnosed with a medical history and physical exam. A sample of the growth may be tested (skin biopsy). You may need to see a skin specialist (dermatologist). How is this treated? Treatment is not usually needed for this condition, unless the growths are irritated or bleed often. You may also choose to have the growths removed if you do not like their appearance. Most commonly, these growths are treated with a procedure in which liquid nitrogen is applied to "freeze" off the growth (cryosurgery). They may also be burned off with electricity (electrocautery) or removed by scraping (curettage). Follow these instructions at home: Watch your growth for any changes. Keep all follow-up visits as told by your health care provider. This is important. Do not scratch or pick at the growth or growths. This can cause them to become irritated or infected. Contact a health care provider if: You suddenly have many new  growths. Your growth bleeds, itches, or hurts. Your growth suddenly becomes larger or changes color. Summary A seborrheic keratosis is a common, noncancerous (benign) skin growth. Treatment is not usually needed for this condition, unless the growths are irritated or bleed often. Watch your growth for any changes. Contact a health care provider if you suddenly have many new growths or your growth suddenly becomes larger or changes color. Keep all follow-up visits as told by your health care provider. This is important. This information is not intended to replace advice given to you by your health care provider. Make sure you discuss any questions you have with your health care provider. Document Revised: 04/11/2021 Document Reviewed: 04/11/2021 Elsevier Patient Education  2023 Elsevier Inc.  

## 2021-11-20 ENCOUNTER — Encounter: Payer: Self-pay | Admitting: Family Medicine

## 2021-11-20 ENCOUNTER — Ambulatory Visit (INDEPENDENT_AMBULATORY_CARE_PROVIDER_SITE_OTHER): Payer: Medicare Other | Admitting: Family Medicine

## 2021-11-20 VITALS — BP 128/68 | HR 80 | Temp 98.5°F | Resp 18 | Ht 64.0 in | Wt 206.6 lb

## 2021-11-20 DIAGNOSIS — L820 Inflamed seborrheic keratosis: Secondary | ICD-10-CM

## 2021-11-20 DIAGNOSIS — L570 Actinic keratosis: Secondary | ICD-10-CM

## 2021-11-20 NOTE — Patient Instructions (Signed)
Skin Biopsy, Care After The following information offers guidance on how to care for yourself after your procedure. Your health care provider may also give you more specific instructions. If you have problems or questions, contact your health care provider. What can I expect after the procedure? After the procedure, it is common to have: Soreness or mild pain. Bruising. Itching. Some redness and swelling. Follow these instructions at home: Biopsy site care  Follow instructions from your health care provider about how to take care of your biopsy site. Make sure you: Wash your hands with soap and water for at least 20 seconds before and after you change your bandage (dressing). If soap and water are not available, use hand sanitizer. Change your dressing as told by your health care provider. Leave stitches (sutures), skin glue, or adhesive strips in place. These skin closures may need to stay in place for 2 weeks or longer. If adhesive strip edges start to loosen and curl up, you may trim the loose edges. Do not remove adhesive strips completely unless your health care provider tells you to do that. Check your biopsy site every day for signs of infection. Check for: More redness, swelling, or pain. Fluid or blood. Warmth. Pus or a bad smell. Do not take baths, swim, or use a hot tub until your health care provider approves. Ask your health care provider if you may take showers. You may only be allowed to take sponge baths. General instructions Take over-the-counter and prescription medicines only as told by your health care provider. Return to your normal activities as told by your health care provider. Ask your health care provider what activities are safe for you. Keep all follow-up visits. This is important. Contact a health care provider if: You have more redness, swelling, or pain around your biopsy site. You have fluid or blood coming from your biopsy site. Your biopsy site feels warm  to the touch. You have pus or a bad smell coming from your biopsy site. You have a fever. Your sutures, skin glue, or adhesive strips loosen or come off sooner than expected. Get help right away if: You have bleeding that does not stop with pressure or a dressing. Summary After the procedure, it is common to have soreness, bruising, and itching at the site. Follow instructions from your health care provider about how to take care of your biopsy site. Check your biopsy site every day for signs of infection. Contact a health care provider if you have more redness, swelling, or pain around your biopsy site, or your biopsy site feels warm to the touch. Keep all follow-up visits. This is important. This information is not intended to replace advice given to you by your health care provider. Make sure you discuss any questions you have with your health care provider. Document Revised: 01/16/2021 Document Reviewed: 01/16/2021 Elsevier Patient Education  2023 Elsevier Inc.  

## 2021-11-20 NOTE — Progress Notes (Signed)
Shave Biopsy Procedure Note  Pre-operative Diagnosis: Suspicious lesion  Post-operative Diagnosis: keratosis   Locations:lower trunk---  pubic area   Indications: bleeding and irritated   Anesthesia: Lidocaine 1% without epinephrine without added sodium bicarbonate  Procedure Details  History of allergy to iodine: no  Patient informed of the risks (including bleeding and infection) and benefits of the  procedure and Verbal informed consent obtained.  The lesion and surrounding area were given a sterile prep using betadyne and draped in the usual sterile fashion. A scalpel was used to shave an area of skin approximately 2cm by 2cm.  Hemostasis achieved with silver nitrate sticks and pressure dressing. . The specimen was sent for pathologic examination. The patient tolerated the procedure well.  EBL: 1 ml  Findings: Keratosis   Condition: Stable  Complications: none.  Plan: 1. Instructed to keep the wound dry and covered for 24-48h and clean thereafter. 2. Warning signs of infection were reviewed.   3. Recommended that the patient use OTC acetaminophen as needed for pain.  4. Return in  prn  .

## 2021-12-04 ENCOUNTER — Encounter: Payer: Self-pay | Admitting: Family Medicine

## 2021-12-05 ENCOUNTER — Ambulatory Visit: Payer: Self-pay

## 2021-12-05 ENCOUNTER — Ambulatory Visit: Payer: Medicare Other | Admitting: Family Medicine

## 2021-12-05 ENCOUNTER — Encounter: Payer: Self-pay | Admitting: Family Medicine

## 2021-12-05 VITALS — BP 140/80 | Ht 64.0 in | Wt 206.0 lb

## 2021-12-05 DIAGNOSIS — M1711 Unilateral primary osteoarthritis, right knee: Secondary | ICD-10-CM

## 2021-12-05 MED ORDER — KETOROLAC TROMETHAMINE 30 MG/ML IJ SOLN
30.0000 mg | Freq: Once | INTRAMUSCULAR | Status: AC
  Administered 2021-12-05: 30 mg via INTRA_ARTICULAR

## 2021-12-05 NOTE — Assessment & Plan Note (Signed)
Acutely occurring.  Having a large hemarthrosis today which she has never had a history of previously.  Denies any injury and no significant instability on exam. -Counseled on home exercise therapy and supportive care. -Toradol intra-articular injection. -May need to consider further imaging or surgery given the hemarthrosis today. -Can pursue Zilretta.

## 2021-12-05 NOTE — Addendum Note (Signed)
Addended by: Cresenciano Lick on: 12/05/2021 01:25 PM   Modules accepted: Orders

## 2021-12-05 NOTE — Patient Instructions (Signed)
Good to see you Please use ice as needed  We can star the process for zilretta and do it after the 16th.   Please send me a message in MyChart with any questions or updates.  We'll call when the zilretta is in.   --Dr. Raeford Razor

## 2021-12-05 NOTE — Progress Notes (Signed)
  Heather Pierce - 71 y.o. female MRN [id number]  Date of birth: 1950-10-27  SUBJECTIVE:  Including CC & ROS.  No chief complaint on file.   Heather Pierce is a 71 y.o. female that is presenting with acute exacerbation of her right knee pain.  Denies any recent injury or inciting event.  Having significant swelling.   Review of Systems See HPI   HISTORY: Past Medical, Surgical, Social, and Family History Reviewed & Updated per EMR.   Pertinent Historical Findings include:  Past Medical History:  Diagnosis Date   Allergy    GERD (gastroesophageal reflux disease)    Hyperlipidemia    Hypertension     Past Surgical History:  Procedure Laterality Date   KNEE ARTHROSCOPY  11/2009   left      PHYSICAL EXAM:  VS: BP 140/80 (BP Location: Left Arm, Patient Position: Sitting)   Ht '5\' 4"'$  (1.626 m)   Wt 206 lb (93.4 kg)   LMP 09/24/2005   BMI 35.36 kg/m  Physical Exam Gen: NAD, alert, cooperative with exam, well-appearing MSK:  Neurovascularly intact     Aspiration/Injection Procedure Note Ersa Delaney [birth year]  Procedure: Aspiration and Injection Indications: Right knee pain  Procedure Details Consent: Risks of procedure as well as the alternatives and risks of each were explained to the (patient/caregiver).  Consent for procedure obtained. Time Out: Verified patient identification, verified procedure, site/side was marked, verified correct patient position, special equipment/implants available, medications/allergies/relevent history reviewed, required imaging and test results available.  Performed.  The area was cleaned with iodine and alcohol swabs.    The right knee superior lateral suprapatellar pouch was injected using 3 cc of 1% lidocaine on a 25-gauge 1-1/2 inch needle.  An 18-gauge 1-1/2 inch needle was used to achieve aspiration.  The syringe was switched to mixture containing 1 cc's of 30 mg Toradol and 4 cc's of 0.5% bupivacaine was injected.  Ultrasound was  used. Images were obtained in long views showing the injection.    Amount of Fluid Aspirated:  154m Character of Fluid: bloody and red colored Fluid was sent for: n/a  A sterile dressing was applied.  Patient did tolerate procedure well.     ASSESSMENT & PLAN:   Primary osteoarthritis of right knee Acutely occurring.  Having a large hemarthrosis today which she has never had a history of previously.  Denies any injury and no significant instability on exam. -Counseled on home exercise therapy and supportive care. -Toradol intra-articular injection. -May need to consider further imaging or surgery given the hemarthrosis today. -Can pursue Zilretta.

## 2021-12-06 ENCOUNTER — Ambulatory Visit: Payer: Medicare Other | Admitting: Family Medicine

## 2021-12-07 ENCOUNTER — Encounter: Payer: Self-pay | Admitting: Family Medicine

## 2021-12-10 ENCOUNTER — Other Ambulatory Visit: Payer: Self-pay | Admitting: Family Medicine

## 2021-12-10 ENCOUNTER — Telehealth: Payer: Self-pay | Admitting: Family Medicine

## 2021-12-10 DIAGNOSIS — M1711 Unilateral primary osteoarthritis, right knee: Secondary | ICD-10-CM

## 2021-12-10 NOTE — Progress Notes (Signed)
Acutely worsening of her pain with recent hemarthrosis.  Has significant bloody aspiration.  Previous MRI showed meniscal change having acute change of the intra-articular structure.  Pursue MRI of the right knee to evaluate for ligamentous tear or chondral lesion given the hemarthrosis.  Will evaluate for presurgical planning as well.  Rosemarie Ax, MD [facility] 12/10/2021, 10:50 AM

## 2021-12-10 NOTE — Telephone Encounter (Signed)
Pt cld states she sent mychart message for MRI status, advised that Precert maybe required & process is waiting for resonse from [place] message to med asst to check status..  -glh

## 2021-12-12 NOTE — Telephone Encounter (Signed)
MRI is scheduled 12/15/21 @ 9:00 am.

## 2021-12-15 ENCOUNTER — Other Ambulatory Visit: Payer: Medicare Other

## 2021-12-15 ENCOUNTER — Ambulatory Visit (INDEPENDENT_AMBULATORY_CARE_PROVIDER_SITE_OTHER): Payer: Medicare Other

## 2021-12-15 DIAGNOSIS — M1711 Unilateral primary osteoarthritis, right knee: Secondary | ICD-10-CM | POA: Diagnosis not present

## 2021-12-15 DIAGNOSIS — M25561 Pain in right knee: Secondary | ICD-10-CM

## 2021-12-15 DIAGNOSIS — M25461 Effusion, right knee: Secondary | ICD-10-CM | POA: Diagnosis not present

## 2021-12-15 IMAGING — MR MR KNEE*R* W/O CM
7 series · 40 of 40 positions shown · non-contrast
Comparison: Right knee MRI [DATE].

CLINICAL DATA: Right knee pain and swelling for 3 weeks. No known
injury. History of knee arthroplasty in [4S].

EXAM:
MRI OF THE RIGHT KNEE WITHOUT CONTRAST
TECHNIQUE: Multiplanar, multisequence MR imaging of the knee was performed. No
intravenous contrast was administered.

[Series 5: T2 fat-sat · axial · 4.0mm · 0.66mm/px · z∈[-63,+57]mm · 4 of 25 slices shown (1 of 3)]
[im 1/25]
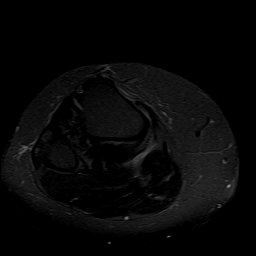
[im 9/25]
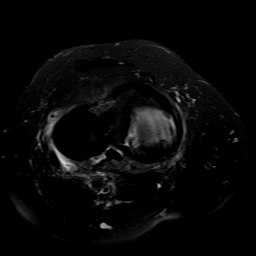
[im 17/25]
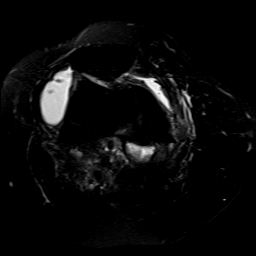
[im 25/25]
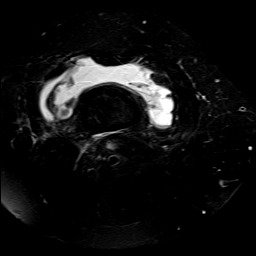

[Series 6: T1 · coronal · 4.0mm · 0.62mm/px · 6 of 30 slices shown]
[im 1/30]
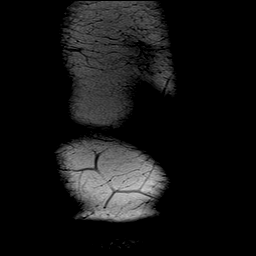
[im 6/30]
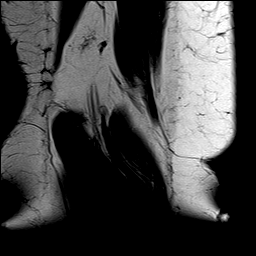
[im 12/30]
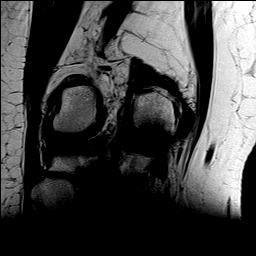
[im 18/30]
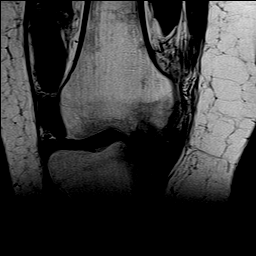
[im 24/30]
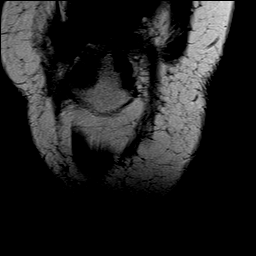
[im 30/30]
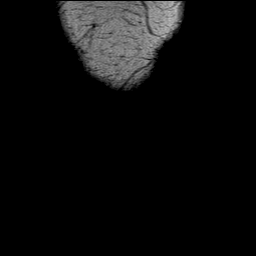

[Series 7: T2 fat-sat · coronal · 4.0mm · 0.62mm/px · 6 of 30 slices shown (2 of 3)]
[im 1/30]
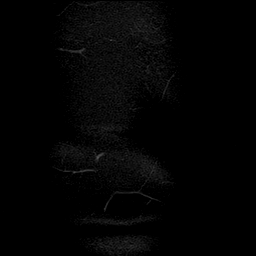
[im 6/30]
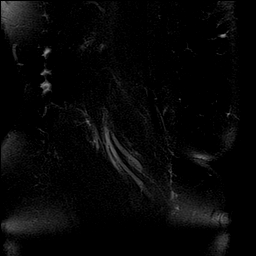
[im 12/30]
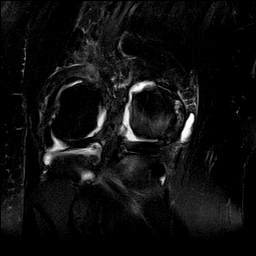
[im 18/30]
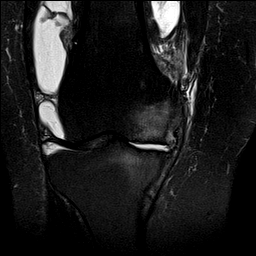
[im 24/30]
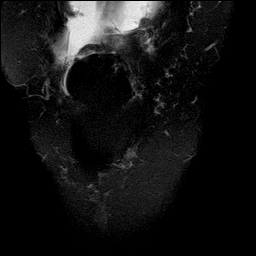
[im 30/30]
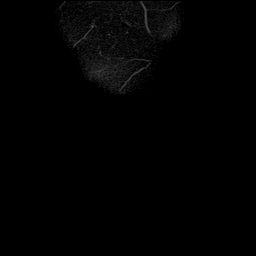

[Series 8: PD fat-sat · coronal · 3.0mm · 0.62mm/px · 8 of 40 slices shown (1 of 3)]
[im 1/40]
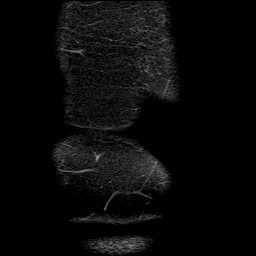
[im 6/40]
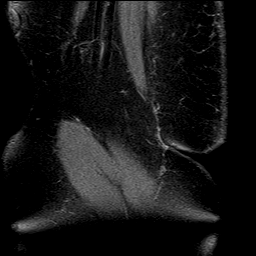
[im 12/40]
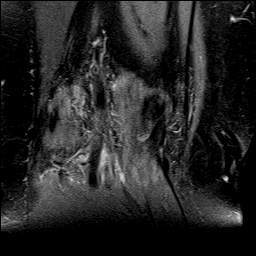
[im 17/40]
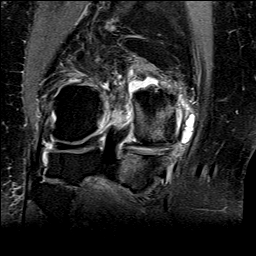
[im 23/40]
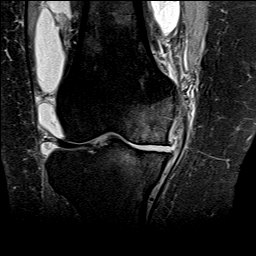
[im 28/40]
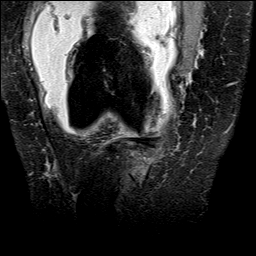
[im 34/40]
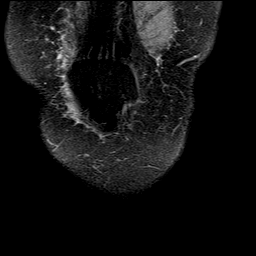
[im 40/40]
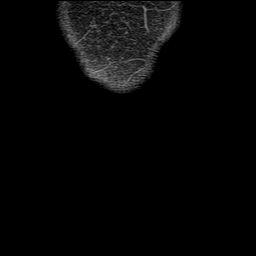

[Series 9: PD fat-sat · sagittal · 3.0mm · 0.62mm/px · 6 of 31 slices shown (2 of 3)]
[im 1/31]
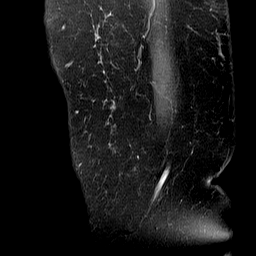
[im 7/31]
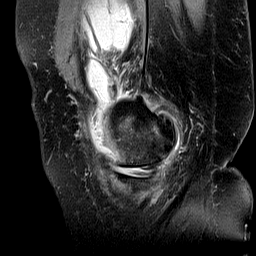
[im 13/31]
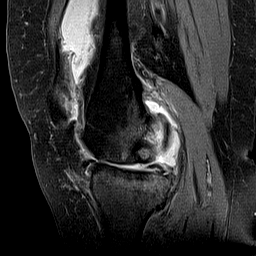
[im 19/31]
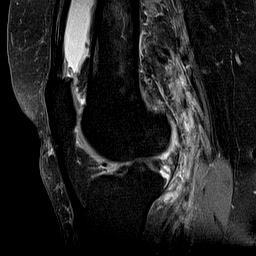
[im 25/31]
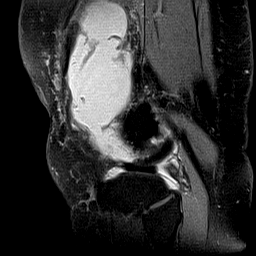
[im 31/31]
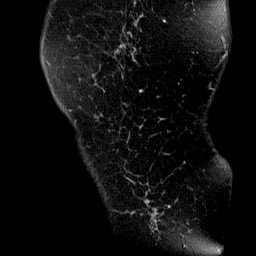

[Series 10: T2 fat-sat · sagittal · 3.0mm · 0.62mm/px · 6 of 31 slices shown (3 of 3)]
[im 1/31]
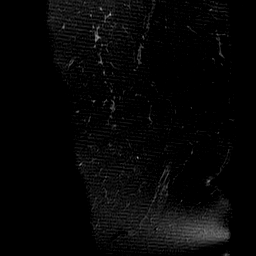
[im 7/31]
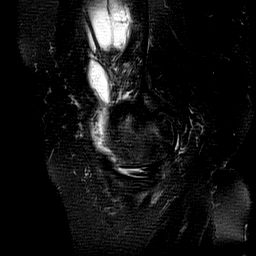
[im 13/31]
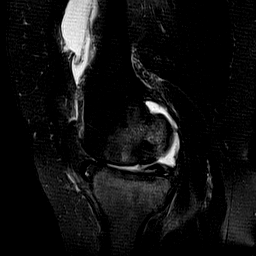
[im 19/31]
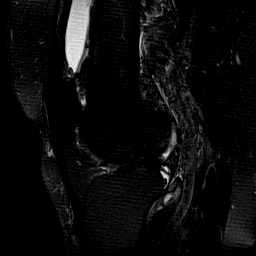
[im 25/31]
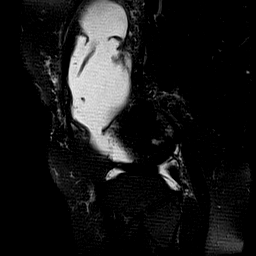
[im 31/31]
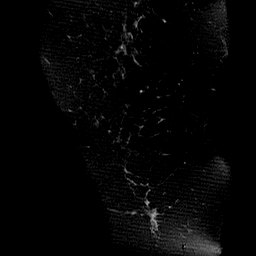

[Series 11: PD fat-sat · coronal · 2.0mm · 0.59mm/px · 4 of 19 slices shown (3 of 3)]
[im 1/19]
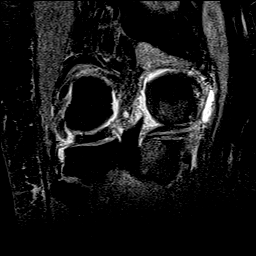
[im 7/19]
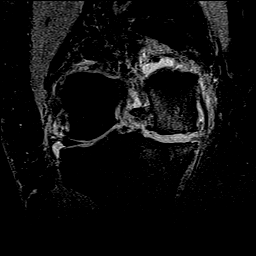
[im 13/19]
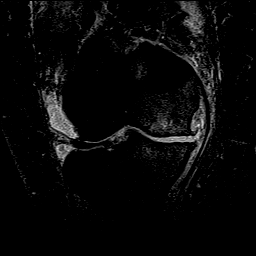
[im 19/19]
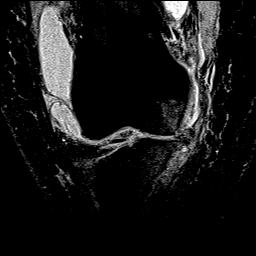

[40 of 40 positions shown; findings below may reference images not displayed]

FINDINGS: Despite efforts by the technologist and patient, mild motion
artifact is present on today's exam and could not be eliminated.
This reduces exam sensitivity and specificity.

MENISCI

Medial meniscus: Diffusely macerated and partially extruded
peripherally from the joint. There is a large horizontal tear
involving the posterior horn and body, similar to previous MRI. No
centrally displaced meniscal fragment identified. Enlarging
peripheral parameniscal cysts measuring up to 1.9 cm on image [DATE].

Lateral meniscus:  Intact with normal morphology.

LIGAMENTS

Cruciates:  Intact.

Collaterals: Intact. There is degenerative buckling of the medial
collateral ligament.

CARTILAGE

Patellofemoral: Mild chondral thinning and surface irregularity with
mild subchondral cyst formation superiorly at the patellar apex.

Medial: Severe medial compartment osteoarthritis with progressive
chondral thinning, osteophyte formation and subchondral edema
throughout the medial femoral condyle and medial tibial plateau.

Lateral: Mild chondral thinning and osteophyte formation. No focal
chondral defect.

MISCELLANEOUS

Joint:  Large complex knee joint effusion.

Popliteal Fossa: The popliteus muscle and tendon are intact. No
significant Baker's cyst.

Extensor Mechanism:  Intact.

Bones: No acute or significant extra-articular osseous findings. As
above, progressive severe medial compartment osteoarthritis.

Other: No other significant periarticular soft tissue findings.
IMPRESSION: 1. Chronic recurrent medial meniscal tear. The medial meniscus is
diffusely macerated with an enlarging parameniscal cyst medially.
2. Progressive advanced medial compartment osteoarthritis with
prominent subchondral edema throughout the medial femoral condyle
and medial tibial plateau.
3. Large complex knee joint effusion.
4. The lateral meniscus, cruciate and collateral ligaments are
intact.

## 2021-12-18 ENCOUNTER — Telehealth (INDEPENDENT_AMBULATORY_CARE_PROVIDER_SITE_OTHER): Payer: Medicare Other | Admitting: Family Medicine

## 2021-12-18 ENCOUNTER — Telehealth: Payer: Self-pay | Admitting: Family Medicine

## 2021-12-18 ENCOUNTER — Encounter: Payer: Self-pay | Admitting: Family Medicine

## 2021-12-18 DIAGNOSIS — M1711 Unilateral primary osteoarthritis, right knee: Secondary | ICD-10-CM

## 2021-12-18 NOTE — Assessment & Plan Note (Signed)
MRI is demonstrating worsening of the medial compartment arthritis in the worsening pain is related to the subchondral marrow edema within the medial femoral condyle as well as the medial tibial plateau. -Counseled on home exercise therapy and supportive care. -Referral to physical therapy. -Pursue Zilretta.

## 2021-12-18 NOTE — Progress Notes (Signed)
Virtual Visit via Video Note  I connected with Heather Pierce on 12/18/21 at  8:50 AM EDT by a video enabled telemedicine application and verified that I am speaking with the correct person using two identifiers.  Location: Patient: home Provider: office   I discussed the limitations of evaluation and management by telemedicine and the availability of in person appointments. The patient expressed understanding and agreed to proceed.  History of Present Illness:  Heather Pierce is following up after the MRI of her right knee.  The MRI was demonstrating an enlarging parameniscal cyst with worsening of her medial compartment arthritis but also demonstrating prominent subchondral marrow edema within the medial femoral condyle and medial tibial plateau.   Observations/Objective:   Assessment and Plan:  Osteoarthritis of right knee: MRI is demonstrating worsening of the medial compartment arthritis in the worsening pain is related to the subchondral marrow edema within the medial femoral condyle as well as the medial tibial plateau. -Counseled on home exercise therapy and supportive care. -Referral to physical therapy. -Pursue Zilretta.  Follow Up Instructions:    I discussed the assessment and treatment plan with the patient. The patient was provided an opportunity to ask questions and all were answered. The patient agreed with the plan and demonstrated an understanding of the instructions.   The patient was advised to call back or seek an in-person evaluation if the symptoms worsen or if the condition fails to improve as anticipated.   Clearance Coots, MD

## 2021-12-19 NOTE — Telephone Encounter (Signed)
Pt informed of below.   Rec'd summary of benefits from [employer]. Patient's plan covers Zilretta at 80%. Patient owes 20% of the cost of Zilretta ( approx $250) and a $30 copay. Deductibles do not apply. OV scheduled 12/20/21.

## 2021-12-20 ENCOUNTER — Ambulatory Visit: Payer: Self-pay

## 2021-12-20 ENCOUNTER — Encounter: Payer: Self-pay | Admitting: Family Medicine

## 2021-12-20 ENCOUNTER — Ambulatory Visit: Payer: Medicare Other | Admitting: Family Medicine

## 2021-12-20 VITALS — BP 150/80 | Ht 64.0 in | Wt 206.0 lb

## 2021-12-20 DIAGNOSIS — M1711 Unilateral primary osteoarthritis, right knee: Secondary | ICD-10-CM

## 2021-12-20 MED ORDER — TRIAMCINOLONE ACETONIDE 32 MG IX SRER
32.0000 mg | Freq: Once | INTRA_ARTICULAR | Status: AC
  Administered 2021-12-20: 32 mg via INTRA_ARTICULAR

## 2021-12-20 NOTE — Patient Instructions (Signed)
Good to see you Please use ice as needed  Please let me know if your pain returns at any point  Please send me a message in MyChart with any questions or updates.  Please see me back in 4 weeks.   --Dr. Raeford Razor

## 2021-12-20 NOTE — Progress Notes (Signed)
  Heather Pierce - 71 y.o. female MRN [id number]  Date of birth: [birth year]  SUBJECTIVE:  Including CC & ROS.  No chief complaint on file.   Heather Pierce is a 71 y.o. female that is here for Zilretta injection.   Review of Systems See HPI   HISTORY: Past Medical, Surgical, Social, and Family History Reviewed & Updated per EMR.   Pertinent Historical Findings include:  Past Medical History:  Diagnosis Date   Allergy    GERD (gastroesophageal reflux disease)    Hyperlipidemia    Hypertension     Past Surgical History:  Procedure Laterality Date   KNEE ARTHROSCOPY  11/2009   left      PHYSICAL EXAM:  VS: BP (!) 150/80 (BP Location: Left Arm, Patient Position: Sitting)   Ht '5\' 4"'$  (1.626 m)   Wt 206 lb (93.4 kg)   LMP 09/24/2005   BMI 35.36 kg/m  Physical Exam Gen: NAD, alert, cooperative with exam, well-appearing MSK:  Neurovascularly intact     Aspiration/Injection Procedure Note Heather Pierce [birth year]  Procedure: Aspiration and Injection Indications: right knee pain  Procedure Details Consent: Risks of procedure as well as the alternatives and risks of each were explained to the (patient/caregiver).  Consent for procedure obtained. Time Out: Verified patient identification, verified procedure, site/side was marked, verified correct patient position, special equipment/implants available, medications/allergies/relevent history reviewed, required imaging and test results available.  Performed.  The area was cleaned with iodine and alcohol swabs.    The right knee superior lateral suprapatellar pouch was injected using 3 cc of 1% lidocaine on a 25-gauge 1-1/2 inch needle.  An 18-gauge 1-1/2 needle was used to achieve aspiration.  The syringe was switched and a mixture containing 5 cc's of 32 mg Zilretta and 4 cc's of 0.25% bupivacaine was injected.  Ultrasound was used. Images were obtained in long views showing the injection.   Amount of Fluid Aspirated:   53m Character of Fluid: red colored Fluid was sent for: n/a  A sterile dressing was applied.  Patient did tolerate procedure well.       ASSESSMENT & PLAN:   Primary osteoarthritis of right knee Completed zilretta injection and aspiration.

## 2021-12-20 NOTE — Assessment & Plan Note (Signed)
Completed zilretta injection and aspiration  

## 2021-12-21 ENCOUNTER — Ambulatory Visit: Payer: Medicare Other | Attending: Family Medicine | Admitting: Physical Therapy

## 2021-12-21 ENCOUNTER — Encounter: Payer: Self-pay | Admitting: Physical Therapy

## 2021-12-21 DIAGNOSIS — R262 Difficulty in walking, not elsewhere classified: Secondary | ICD-10-CM

## 2021-12-21 DIAGNOSIS — M25661 Stiffness of right knee, not elsewhere classified: Secondary | ICD-10-CM | POA: Diagnosis not present

## 2021-12-21 DIAGNOSIS — M1711 Unilateral primary osteoarthritis, right knee: Secondary | ICD-10-CM | POA: Insufficient documentation

## 2021-12-21 DIAGNOSIS — R600 Localized edema: Secondary | ICD-10-CM | POA: Diagnosis not present

## 2021-12-21 DIAGNOSIS — M25561 Pain in right knee: Secondary | ICD-10-CM | POA: Diagnosis not present

## 2021-12-26 ENCOUNTER — Encounter: Payer: Self-pay | Admitting: Physical Therapy

## 2021-12-26 ENCOUNTER — Ambulatory Visit: Payer: Medicare Other | Admitting: Physical Therapy

## 2021-12-26 DIAGNOSIS — M25661 Stiffness of right knee, not elsewhere classified: Secondary | ICD-10-CM

## 2021-12-26 DIAGNOSIS — M25561 Pain in right knee: Secondary | ICD-10-CM | POA: Diagnosis not present

## 2021-12-26 DIAGNOSIS — R262 Difficulty in walking, not elsewhere classified: Secondary | ICD-10-CM

## 2021-12-26 DIAGNOSIS — R600 Localized edema: Secondary | ICD-10-CM | POA: Diagnosis not present

## 2021-12-26 DIAGNOSIS — M1711 Unilateral primary osteoarthritis, right knee: Secondary | ICD-10-CM | POA: Diagnosis not present

## 2021-12-26 NOTE — Therapy (Signed)
OUTPATIENT PHYSICAL THERAPY LOWER EXTREMITY EVALUATION   Patient Name: Heather Pierce MRN: [id number] DOB:Feb 24, 1951, 71 y.o., female Today's Date: 12/26/2021   PT End of Session - 12/26/21 1449     Visit Number 2    Number of Visits 16    Date for PT Re-Evaluation 02/15/22    Authorization Type UHC Medicare    PT Start Time 7782    PT Stop Time 4235    PT Time Calculation (min) 41 min    Activity Tolerance Patient tolerated treatment well    Behavior During Therapy WFL for tasks assessed/performed             Past Medical History:  Diagnosis Date   Allergy    GERD (gastroesophageal reflux disease)    Hyperlipidemia    Hypertension    Past Surgical History:  Procedure Laterality Date   KNEE ARTHROSCOPY Right 11/29/2009   Patient Active Problem List   Diagnosis Date Noted   Melanoma of skin ([place]) 11/15/2021   Keratosis 11/15/2021   Acute pain of left knee 05/31/2019   Primary osteoarthritis of right knee 05/03/2019   Obesity (BMI 30-39.9) 07/15/2013   Hyperlipidemia 09/25/2010   Preventative health care 09/25/2010   GERD 03/15/2008   Essential hypertension 12/17/2007    PCP: Ann Held, DO  REFERRING PROVIDER: Rosemarie Ax, MD  REFERRING DIAG: M17.11 (ICD-10-CM) - Primary osteoarthritis of right knee  THERAPY DIAG:  Acute pain of right knee  Stiffness of right knee, not elsewhere classified  Localized edema  Difficulty in walking, not elsewhere classified  Rationale for Evaluation and Treatment Rehabilitation  ONSET DATE: ~ 2 years ago  SUBJECTIVE:   SUBJECTIVE STATEMENT: Pt. Reports R knee pain seems to be improving every day after the infusion.  Having trouble laying down to do exercises due to sinuses.   PERTINENT HISTORY: HTN, previous R knee surgery  PAIN:  Are you having pain? Yes: NPRS scale: 5/10 Pain location: R knee Pain description:  constant ache medial side Aggravating factors: walking, twisting Relieving  factors: ice/elevation/ibuprofen PRN  PRECAUTIONS: None  WEIGHT BEARING RESTRICTIONS No  FALLS:  Has patient fallen in last 6 months? 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 Chronic recurrent medial meniscal tear. The medial meniscus is diffusely macerated with an enlarging parameniscal cyst medially. 2. Progressive advanced medial compartment osteoarthritis with prominent subchondral edema throughout the medial femoral condyle and medial tibial plateau. 3. Large complex knee joint effusion. 4. The lateral meniscus, cruciate and collateral ligaments are intact.  PATIENT SURVEYS:  LEFS 33/80 = 41% ability; 59% disability  COGNITION:  Overall cognitive status: Within functional limits for tasks assessed     SENSATION: WFL  EDEMA:  Edema on R, wearing compression brace around knee.  R 47 cm, L 49 cm circumferential at joint line.   MUSCLE LENGTH: Hamstrings: Right ~80 deg; Left 90 deg Ely's (prone knee bend for quadriceps): Right 95 deg; Left 105 deg  POSTURE: No Significant postural limitations  PALPATION: Tenderness R MCL, posterior knee (hamstrings), distal R  patella at joint line  LOWER EXTREMITY ROM:  Passive ROM Right 6/23 Left 6/23  Knee flexion 115 122  Knee extension 10 2   (Blank rows = not tested)  LOWER EXTREMITY MMT:  MMT Right eval Left eval  Hip flexion 4+ 4+  Hip extension 4+ 5  Hip abduction 5 5  Hip adduction 5 5  Knee flexion 5 5  Knee extension 15 deg quad lag, 3/5 limited by pain and  swelling 5  Ankle dorsiflexion 5 5   (Blank rows = not tested)  FUNCTIONAL TESTS:  5 times sit to stand: 14 sec without UE assist  GAIT: Distance walked: 67' Assistive device utilized: None Level of assistance: Complete Independence Comments: antalgic, decreased gait speed, RLE in external rotation, decreased heel strike, decreased stride length.     TODAY'S TREATMENT: 12/26/2021 Therapeutic Exercise: to improve strength and mobility.   Bike L1 x 6 min  Seated straight leg raise 2 x 10 R Hamstring stretches sitting 2 x 30 sec bil  At counter Squats to chair 2 x 10  Heel raises x 10  Toe raises x 10  Hip extension x 10 bil    Hip abduction  x 10 R, x 5 L (increased R knee pain in stance).  Manual Therapy: to decrease muscle spasm and pain and improve mobility.  STM to R medial knee, self STM with massage stick to quads after demo.   12/21/2021 Therapeutic Exercise: to improve strength and mobility.  Demo, verbal and tactile cues throughout for technique. -Supine Short Arc Quad   10 reps - Supine Quadricep Sets  -5 reps - 5 sec hold - Supine Ankle Pumps  - 10 reps - Seated Long Arc Quad -10 reps with ankle pump - Active Straight Leg Raise with Quad Set  - 10 reps bil - Supine Bridge  -10 reps   PATIENT EDUCATION:  Education details: reviewed HEP modified to eliminate supine exercises and progressed standing exercises.  Person educated: Patient Education method: Explanation, Demonstration, Verbal cues, and Handouts Education comprehension: verbalized understanding and returned demonstration   HOME EXERCISE PROGRAM: Access Code: [email or web address]  ASSESSMENT:  CLINICAL IMPRESSION: Patient reports improving R knee pain.  She reported some difficulty with supine exercises, so focus of today's session was reviewing and modifying HEP to sitting or standing exercises instead of supine.  She tolerated exercises well except for hip abduction on L with RLE in stance.  She is to perform  only R hip abduction for now.  She reported less pain R medial knee and decreased tightness following self STM.  Given information on inexpensive options for massage stick.  She would benefit from continued skilled therapy.    OBJECTIVE IMPAIRMENTS Abnormal gait, decreased activity tolerance, decreased endurance, decreased mobility, difficulty walking, decreased ROM, decreased strength, increased edema, increased fascial restrictions, increased muscle spasms, and pain.   ACTIVITY LIMITATIONS lifting, bending, sitting, standing, squatting, sleeping, stairs, and transfers  PARTICIPATION LIMITATIONS: cleaning, laundry, shopping, community activity, yard work, and volunteering at [employer].   PERSONAL FACTORS Age, Time since onset of injury/illness/exacerbation, and 1-2 comorbidities: R meniscus tear (previous surgery) and obesity  are also affecting patient's functional outcome.   REHAB POTENTIAL: Good  CLINICAL DECISION MAKING: Stable/uncomplicated  EVALUATION COMPLEXITY: Low   GOALS: Goals reviewed with patient? Yes  SHORT TERM GOALS: Target date: 01/04/2022  Patient will be independent with initial HEP. Baseline: given Goal status: IN PROGRESS  LONG TERM GOALS: Target date: 02/15/2022   Patient will be  independent with advanced/ongoing HEP to improve outcomes and carryover.  Baseline: needs advancement as tolerated Goal status: IN PROGRESS  2.  Patient will report at least 75% improvement in R knee pain to improve QOL. Baseline: 5-10/10 R knee pain Goal status: IN PROGRESS  3.  Patient will demonstrate improved R knee AROM to >/= 2-120 deg to allow for normal gait and stair mechanics. Baseline: 10-115 Goal status: IN PROGRESS  4.  Patient will demonstrate improved functional LE strength as demonstrated by full R knee extension without quad lag. Baseline: 15 deg quad lag Goal status: IN PROGRESS  5.  Patient will be able to ambulate 1200' with LRAD and normal gait  pattern without increased pain to access community.  Baseline: antalgic, slow, RLE in ER Goal status: IN PROGRESS  6. Patient will be able to ascend/descend stairs with 1 HR and reciprocal step pattern safely to access home and community.  Baseline: step to gait, avoids stairs or going up stairs due to pain Goal status: IN PROGRESS  7.  Patient will report <40% impairment on LEFS to demonstrate improved functional ability. Baseline: 33/80 indicates 59% disability  Goal status: IN PROGRESS  8.  Patient will be able to ambulate over grassy surfaces without increased R knee pain to volunteer at golf tournaments.  Baseline: unable due to R knee pain Goal status: IN PROGRESS   PLAN: PT FREQUENCY: 1-2x/week  PT DURATION: 8 weeks  PLANNED INTERVENTIONS: Therapeutic exercises, Therapeutic activity, Neuromuscular re-education, Balance training, Gait training, Patient/Family education, Joint mobilization, Stair training, Aquatic Therapy, Dry Needling, Electrical stimulation, Cryotherapy, Moist heat, Taping, Vasopneumatic device, Ultrasound, Ionotophoresis '4mg'$ /ml Dexamethasone, and Manual therapy  PLAN FOR NEXT SESSION: review HEP, continue to progress R quad/hip strengthening, manual therapy/modalities PRN   Rennie Natter, PT, DPT  12/26/2021, 4:33 PM

## 2021-12-28 ENCOUNTER — Ambulatory Visit (HOSPITAL_BASED_OUTPATIENT_CLINIC_OR_DEPARTMENT_OTHER): Payer: Medicare Other | Attending: Family Medicine | Admitting: Physical Therapy

## 2021-12-28 ENCOUNTER — Encounter (HOSPITAL_BASED_OUTPATIENT_CLINIC_OR_DEPARTMENT_OTHER): Payer: Self-pay | Admitting: Physical Therapy

## 2021-12-28 DIAGNOSIS — M25561 Pain in right knee: Secondary | ICD-10-CM | POA: Insufficient documentation

## 2021-12-28 DIAGNOSIS — M25661 Stiffness of right knee, not elsewhere classified: Secondary | ICD-10-CM | POA: Diagnosis not present

## 2021-12-28 DIAGNOSIS — R600 Localized edema: Secondary | ICD-10-CM | POA: Diagnosis not present

## 2021-12-28 DIAGNOSIS — R262 Difficulty in walking, not elsewhere classified: Secondary | ICD-10-CM | POA: Diagnosis not present

## 2021-12-28 NOTE — Therapy (Signed)
OUTPATIENT PHYSICAL THERAPY LOWER EXTREMITY EVALUATION   Patient Name: Heather Pierce MRN: [id number] DOB:[birth year], 71 y.o., female Today's Date: 12/28/2021   PT End of Session - 12/28/21 1057     Visit Number 3    Number of Visits 16    Date for PT Re-Evaluation 02/15/22    Authorization Type UHC Medicare    PT Start Time 1100    PT Stop Time 1140    PT Time Calculation (min) 40 min    Activity Tolerance Patient tolerated treatment well    Behavior During Therapy WFL for tasks assessed/performed             Past Medical History:  Diagnosis Date   Allergy    GERD (gastroesophageal reflux disease)    Hyperlipidemia    Hypertension    Past Surgical History:  Procedure Laterality Date   KNEE ARTHROSCOPY Right 11/29/2009   Patient Active Problem List   Diagnosis Date Noted   Melanoma of skin ([place]) 11/15/2021   Keratosis 11/15/2021   Acute pain of left knee 05/31/2019   Primary osteoarthritis of right knee 05/03/2019   Obesity (BMI 30-39.9) 07/15/2013   Hyperlipidemia 09/25/2010   Preventative health care 09/25/2010   GERD 03/15/2008   Essential hypertension 12/17/2007    PCP: Ann Held, DO  REFERRING PROVIDER: Rosemarie Ax, MD  REFERRING DIAG: M17.11 (ICD-10-CM) - Primary osteoarthritis of right knee  THERAPY DIAG:  Acute pain of right knee  Stiffness of right knee, not elsewhere classified  Localized edema  Difficulty in walking, not elsewhere classified  Rationale for Evaluation and Treatment Rehabilitation  ONSET DATE: ~ 2 years ago  SUBJECTIVE:   SUBJECTIVE STATEMENT: Pt reports that her R knee pain is gradually improving. "It's feeling better every day" since injection.   PERTINENT HISTORY: HTN, previous R knee surgery  PAIN:  Are you having pain? Yes: NPRS scale: 0/10 Pain location: R knee Pain description:  constant ache medial side Aggravating factors: walking, twisting Relieving factors: ice/elevation/ibuprofen  PRN  PRECAUTIONS: None  WEIGHT BEARING RESTRICTIONS No  FALLS:  Has patient fallen in last 6 months? 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 Chronic recurrent medial meniscal tear. The medial meniscus is diffusely macerated with an enlarging parameniscal cyst medially. 2. Progressive advanced medial compartment osteoarthritis with prominent subchondral edema throughout the medial femoral condyle and medial tibial plateau. 3. Large complex knee joint effusion. 4. The lateral meniscus, cruciate and collateral ligaments are intact.  PATIENT SURVEYS:  LEFS 33/80 = 41% ability; 59% disability  COGNITION:  Overall cognitive status: Within functional limits for tasks assessed     SENSATION: WFL  EDEMA:  Edema on R, wearing compression brace around knee.  R 47 cm, L 49 cm circumferential at joint line.   MUSCLE LENGTH: Hamstrings: Right ~80 deg; Left 90 deg Ely's (prone knee bend for quadriceps): Right 95 deg; Left 105 deg  POSTURE: No Significant postural limitations  PALPATION: Tenderness R MCL, posterior knee (hamstrings), distal R patella at joint line  LOWER EXTREMITY ROM:  Passive ROM Right 6/23 Left 6/23  Knee flexion 115 122  Knee extension 10 2   (Blank rows = not tested)  LOWER EXTREMITY MMT:  MMT Right eval Left eval  Hip flexion 4+ 4+  Hip extension 4+ 5  Hip abduction 5 5  Hip adduction 5 5  Knee flexion 5 5  Knee extension 15 deg quad lag, 3/5 limited by pain and swelling 5  Ankle dorsiflexion 5 5    (Blank rows = not tested)  FUNCTIONAL TESTS:  5 times sit to stand: 14 sec without UE assist  GAIT: Distance walked: 36' Assistive device utilized: None Level of assistance: Complete Independence Comments: antalgic, decreased gait speed, RLE in external rotation, decreased heel strike, decreased stride length.     TODAY'S TREATMENT: 12/28/2021 Pt seen for aquatic therapy today.  Treatment took place in water 3.25-4.0 ft in depth at the [facility] pool. Temp of water was 91.  Pt entered/exited the pool independently via stairs with bilat rail.  Forward walking 3 laps Backward walking 3 laps Side stepping with arm abdct/add 3 laps Heel/toe raises Squats Hip Abdct/ add  Hip ext Alternating hamstring curls Walking forward kicks holding yellow noodle Sit to stand on bench in water - increased Rt knee pain, stopped after 5 Standing bilat calf stretch with heels off of step Standing hamstring stretch with foot on 2nd step  Pt requires the buoyancy of water for active assisted exercises with buoyancy supported for strengthening and AROM exercises. Hydrostatic pressure also supports joints by unweighting joint load by at least 50 % in 3-4 feet depth water. 80% in chest to neck deep water. Water will provide assistance with movement using the current and laminar flow while the buoyancy reduces weight bearing. Pt requires the viscosity of the water for resistance with strengthening exercises.   12/26/2021 Therapeutic Exercise: to improve strength and mobility.   Bike L1 x 6 min  Seated straight leg raise 2 x 10 R Hamstring stretches sitting 2 x 30 sec bil  At counter Squats to chair 2 x 10  Heel raises x 10  Toe raises x 10  Hip extension x 10 bil    Hip abduction  x 10 R, x 5 L (increased R knee pain in stance).  Manual Therapy: to decrease muscle spasm and pain and improve mobility.  STM to R medial knee, self STM with massage stick to quads after demo.   12/21/2021  Therapeutic Exercise: to improve strength and mobility.  Demo, verbal and tactile cues throughout for technique. -Supine Short Arc Quad   10 reps - Supine Quadricep Sets  -5 reps - 5 sec hold - Supine Ankle Pumps  - 10 reps - Seated Long Arc Quad -10 reps with ankle pump - Active Straight Leg Raise with Quad Set  - 10 reps bil - Supine Bridge  -10 reps   PATIENT EDUCATION:  Education details: reviewed HEP modified to eliminate supine exercises and progressed standing exercises.  Person educated: Patient Education method: Explanation, Demonstration, Verbal cues, and Handouts Education comprehension: verbalized understanding and returned demonstration   HOME EXERCISE PROGRAM: Access Code: [email or web address]  ASSESSMENT:  CLINICAL IMPRESSION: Patient is confident in aquatic setting; able to take direction from therapist on deck.  She reports improving R knee pain. She is cautious with standing on RLE, but reports no pain with SLS on RLE during exercises - except for sit to stand at bench in water.  She would benefit from continued skilled therapy. Goals  are ongoing.    OBJECTIVE IMPAIRMENTS Abnormal gait, decreased activity tolerance, decreased endurance, decreased mobility, difficulty walking, decreased ROM, decreased strength, increased edema, increased fascial restrictions, increased muscle spasms, and pain.   ACTIVITY LIMITATIONS lifting, bending, sitting, standing, squatting, sleeping, stairs, and transfers  PARTICIPATION LIMITATIONS: cleaning, laundry, shopping, community activity, yard work, and volunteering at [employer].   PERSONAL FACTORS Age, Time since onset of injury/illness/exacerbation, and 1-2 comorbidities: R meniscus tear (previous surgery) and obesity  are also affecting patient's functional outcome.   REHAB POTENTIAL: Good  CLINICAL DECISION MAKING: Stable/uncomplicated  EVALUATION COMPLEXITY: Low   GOALS: Goals reviewed with patient? Yes  SHORT TERM GOALS:  Target date: 01/04/2022  Patient will be independent with initial HEP. Baseline: given Goal status: IN PROGRESS  LONG TERM GOALS: Target date: 02/15/2022   Patient will be independent with advanced/ongoing HEP to improve outcomes and carryover.  Baseline: needs advancement as tolerated Goal status: IN PROGRESS  2.  Patient will report at least 75% improvement in R knee pain to improve QOL. Baseline: 5-10/10 R knee pain Goal status: IN PROGRESS  3.  Patient will demonstrate improved R knee AROM to >/= 2-120 deg to allow for normal gait and stair mechanics. Baseline: 10-115 Goal status: IN PROGRESS  4.  Patient will demonstrate improved functional LE strength as demonstrated by full R knee extension without quad lag. Baseline: 15 deg quad lag Goal status: IN PROGRESS  5.  Patient will be able to ambulate 1200' with LRAD and normal gait pattern without increased pain to access community.  Baseline: antalgic, slow, RLE in ER Goal status: IN PROGRESS  6. Patient will be able to ascend/descend stairs with 1 HR and reciprocal step pattern safely to access home and community.  Baseline: step to gait, avoids stairs or going up stairs due to pain Goal status: IN PROGRESS  7.  Patient will report <40% impairment on LEFS to demonstrate improved functional ability. Baseline: 33/80 indicates 59% disability  Goal status: IN PROGRESS  8.  Patient will be able to ambulate over grassy surfaces without increased R knee pain to volunteer at golf tournaments.  Baseline: unable due to R knee pain Goal status: IN PROGRESS   PLAN: PT FREQUENCY: 1-2x/week  PT DURATION: 8 weeks  PLANNED INTERVENTIONS: Therapeutic exercises, Therapeutic activity, Neuromuscular re-education, Balance training, Gait training, Patient/Family education, Joint mobilization, Stair training, Aquatic Therapy, Dry Needling, Electrical stimulation, Cryotherapy, Moist heat, Taping, Vasopneumatic device, Ultrasound, Ionotophoresis  '4mg'$ /ml Dexamethasone, and Manual therapy  PLAN FOR NEXT SESSION: review HEP, continue to progress R quad/hip strengthening, manual therapy/modalities PRN  Kerin Perna, PTA 12/28/21 11:46 AM

## 2022-01-02 ENCOUNTER — Encounter: Payer: Self-pay | Admitting: Physical Therapy

## 2022-01-02 ENCOUNTER — Ambulatory Visit: Payer: Medicare Other | Attending: Family Medicine | Admitting: Physical Therapy

## 2022-01-02 DIAGNOSIS — R600 Localized edema: Secondary | ICD-10-CM | POA: Diagnosis not present

## 2022-01-02 DIAGNOSIS — R262 Difficulty in walking, not elsewhere classified: Secondary | ICD-10-CM | POA: Insufficient documentation

## 2022-01-02 DIAGNOSIS — M25661 Stiffness of right knee, not elsewhere classified: Secondary | ICD-10-CM | POA: Diagnosis not present

## 2022-01-02 DIAGNOSIS — M25561 Pain in right knee: Secondary | ICD-10-CM | POA: Diagnosis not present

## 2022-01-02 NOTE — Therapy (Signed)
OUTPATIENT PHYSICAL THERAPY LOWER EXTREMITY EVALUATION   Patient Name: Heather Pierce MRN: [id number] DOB:1950-07-03, 71 y.o., female Today's Date: 01/02/2022   PT End of Session - 01/02/22 1606     Visit Number 4    Number of Visits 16    Date for PT Re-Evaluation 02/15/22    Authorization Type [employer] Medicare    PT Start Time 8676    PT Stop Time 1655    PT Time Calculation (min) 47 min    Activity Tolerance Patient tolerated treatment well    Behavior During Therapy WFL for tasks assessed/performed             Past Medical History:  Diagnosis Date   Allergy    GERD (gastroesophageal reflux disease)    Hyperlipidemia    Hypertension    Past Surgical History:  Procedure Laterality Date   KNEE ARTHROSCOPY Right 11/29/2009   Patient Active Problem List   Diagnosis Date Noted   Melanoma of skin ([place]) 11/15/2021   Keratosis 11/15/2021   Acute pain of left knee 05/31/2019   Primary osteoarthritis of right knee 05/03/2019   Obesity (BMI 30-39.9) 07/15/2013   Hyperlipidemia 09/25/2010   Preventative health care 09/25/2010   GERD 03/15/2008   Essential hypertension 12/17/2007    PCP: Ann Held, DO  REFERRING PROVIDER: Rosemarie Ax, MD  REFERRING DIAG: M17.11 (ICD-10-CM) - Primary osteoarthritis of right knee  THERAPY DIAG:  Acute pain of right knee  Stiffness of right knee, not elsewhere classified  Localized edema  Difficulty in walking, not elsewhere classified  Rationale for Evaluation and Treatment Rehabilitation  ONSET DATE: ~ 2 years ago  SUBJECTIVE:   SUBJECTIVE STATEMENT: Patient enjoyed aquatic therapy but was very fatigued afterwards.   R knee is "sore" but not painful, didn't take any medication today.  Went up stairs in home yesterday.  Knee just pops a lot.   PERTINENT HISTORY: HTN, previous R knee surgery  PAIN:  Are you having pain? Yes: NPRS scale: 0/10 Pain location: R knee Pain description:  constant ache medial  side Aggravating factors: walking, twisting Relieving factors: ice/elevation/ibuprofen PRN  PRECAUTIONS: None  WEIGHT BEARING RESTRICTIONS No  FALLS:  Has patient fallen in last 6 months? 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 Chronic recurrent medial meniscal tear. The medial meniscus is diffusely macerated with an enlarging parameniscal cyst medially. 2. Progressive advanced medial compartment osteoarthritis with prominent subchondral edema throughout the medial femoral condyle and medial tibial plateau. 3. Large complex knee joint effusion. 4. The lateral meniscus, cruciate and collateral ligaments are intact.  PATIENT SURVEYS:  LEFS 33/80 = 41% ability; 59% disability  COGNITION:  Overall cognitive status: Within functional limits for tasks assessed     SENSATION: WFL  EDEMA:  Edema on R, wearing compression brace around knee.  R 47 cm, L 49 cm circumferential at joint line.   MUSCLE LENGTH: Hamstrings: Right ~80 deg; Left 90 deg Ely's (prone knee bend for quadriceps): Right 95 deg; Left 105 deg  POSTURE: No Significant postural  limitations  PALPATION: Tenderness R MCL, posterior knee (hamstrings), distal R patella at joint line  LOWER EXTREMITY ROM:  Passive ROM Right 6/23 Left 6/23  Knee flexion 115 122  Knee extension 10 2   (Blank rows = not tested)  LOWER EXTREMITY MMT:  MMT Right eval Left eval  Hip flexion 4+ 4+  Hip extension 4+ 5  Hip abduction 5 5  Hip adduction 5 5  Knee flexion 5 5  Knee  extension 15 deg quad lag, 3/5 limited by pain and swelling 5  Ankle dorsiflexion 5 5   (Blank rows = not tested)  FUNCTIONAL TESTS:  5 times sit to stand: 14 sec without UE assist  GAIT: Distance walked: 61' Assistive device utilized: None Level of assistance: Complete Independence Comments: antalgic, decreased gait speed, RLE in external rotation, decreased heel strike, decreased stride length.     TODAY'S TREATMENT: 01/02/2022 Therapeutic Exercise: to improve strength and mobility.  Demo, verbal and tactile cues throughout for technique. Nustep L5 x 6 min  Step downs (no riser) 2 x 10 bil  Side step downs (no riser) 2 x 10 bil  At counter-  Hip extension x 10 bil  Hip abduction x 10 bil  Ankle sways x 10 Mini-squats x 20 Manual Therapy: to decrease muscle spasm and pain and improve mobility.  STM/IASTM with s/s tools to R MCL/pes anserine/patellar tendon, patellar mobs inf/super  12/28/2021 Pt seen for aquatic therapy today.  Treatment took place in water 3.25-4.0 ft in depth at the [facility] pool. Temp of water was 91.  Pt entered/exited the pool independently via stairs with bilat rail.  Forward walking 3 laps Backward walking 3 laps Side stepping with arm abdct/add 3 laps Heel/toe raises Squats Hip Abdct/ add  Hip ext Alternating hamstring curls Walking forward kicks holding yellow noodle Sit to stand on bench in water - increased Rt knee pain, stopped after 5 Standing bilat calf stretch with heels off of step Standing hamstring stretch with foot on 2nd step  Pt requires the buoyancy of water for active assisted exercises with buoyancy supported for strengthening and AROM exercises. Hydrostatic pressure also supports joints by unweighting joint load by at least 50 % in 3-4 feet depth water. 80% in chest to neck deep water. Water will provide assistance with movement using the current and laminar flow while the buoyancy reduces weight bearing. Pt requires the  viscosity of the water for resistance with strengthening exercises.   12/26/2021 Therapeutic Exercise: to improve strength and mobility.   Bike L1 x 6 min  Seated straight leg raise 2 x 10 R Hamstring stretches sitting 2 x 30 sec bil  At counter Squats to chair 2 x 10  Heel raises x 10  Toe raises x 10  Hip extension x 10 bil    Hip abduction  x 10 R, x 5 L (increased R knee pain in stance).  Manual Therapy: to decrease muscle spasm and pain and improve mobility.  STM to R medial knee, self STM with massage stick to quads after demo.   PATIENT EDUCATION:  Education details: reviewed HEP modified to eliminate supine exercises and progressed standing exercises.  Person educated: Patient Education method: Explanation, Demonstration, Verbal cues, and Handouts Education comprehension: verbalized understanding and returned demonstration   HOME EXERCISE PROGRAM: Access Code: [email or web address]  ASSESSMENT:  CLINICAL IMPRESSION: Ivionna Verley demonstrated improved tolerance to standing exercises today, a little challenged with side step down.  Reported decreased soreness in medial knee  following manual therapy.  She would benefit from continued skilled therapy. Goals are ongoing.    OBJECTIVE IMPAIRMENTS Abnormal gait, decreased activity tolerance, decreased endurance, decreased mobility, difficulty walking, decreased ROM, decreased strength, increased edema, increased fascial restrictions, increased muscle spasms, and pain.   ACTIVITY LIMITATIONS lifting, bending, sitting, standing, squatting, sleeping, stairs, and transfers  PARTICIPATION LIMITATIONS: cleaning, laundry, shopping, community activity, yard work, and volunteering at [employer].   PERSONAL FACTORS Age, Time since onset of injury/illness/exacerbation, and 1-2 comorbidities: R meniscus tear (previous surgery) and obesity  are also affecting patient's functional outcome.   REHAB POTENTIAL: Good  CLINICAL DECISION MAKING:  Stable/uncomplicated  EVALUATION COMPLEXITY: Low   GOALS: Goals reviewed with patient? Yes  SHORT TERM GOALS: Target date: 01/04/2022  Patient will be independent with initial HEP. Baseline: given Goal status: IN PROGRESS  LONG TERM GOALS: Target date: 02/15/2022   Patient will be independent with advanced/ongoing HEP to improve outcomes and carryover.  Baseline: needs advancement as tolerated Goal status: IN PROGRESS  2.  Patient will report at least 75% improvement in R knee pain to improve QOL. Baseline: 5-10/10 R knee pain Goal status: IN PROGRESS  3.  Patient will demonstrate improved R knee AROM to >/= 2-120 deg to allow for normal gait and stair mechanics. Baseline: 10-115 Goal status: IN PROGRESS  4.  Patient will demonstrate improved functional LE strength as demonstrated by full R knee extension without quad lag. Baseline: 15 deg quad lag Goal status: IN PROGRESS  5.  Patient will be able to ambulate 1200' with LRAD and normal gait pattern without increased pain to access community.  Baseline: antalgic, slow, RLE in ER Goal status: IN PROGRESS  6. Patient will be able to ascend/descend stairs with 1 HR and reciprocal step pattern safely to access home and community.  Baseline: step to gait, avoids stairs or going up stairs due to pain Goal status: IN PROGRESS  7.  Patient will report <40% impairment on LEFS to demonstrate improved functional ability. Baseline: 33/80 indicates 59% disability  Goal status: IN PROGRESS  8.  Patient will be able to ambulate over grassy surfaces without increased R knee pain to volunteer at golf tournaments.  Baseline: unable due to R knee pain Goal status: IN PROGRESS   PLAN: PT FREQUENCY: 1-2x/week  PT DURATION: 8 weeks  PLANNED INTERVENTIONS: Therapeutic exercises, Therapeutic activity, Neuromuscular re-education, Balance training, Gait training, Patient/Family education, Joint mobilization, Stair training, Aquatic Therapy,  Dry Needling, Electrical stimulation, Cryotherapy, Moist heat, Taping, Vasopneumatic device, Ultrasound, Ionotophoresis '4mg'$ /ml Dexamethasone, and Manual therapy  PLAN FOR NEXT SESSION: Continue to progress R quad/hip strengthening, manual therapy/modalities PRN  Rennie Natter, PT, DPT  01/02/22 5:07 PM

## 2022-01-08 ENCOUNTER — Encounter (HOSPITAL_BASED_OUTPATIENT_CLINIC_OR_DEPARTMENT_OTHER): Payer: Self-pay | Admitting: Physical Therapy

## 2022-01-08 ENCOUNTER — Ambulatory Visit (HOSPITAL_BASED_OUTPATIENT_CLINIC_OR_DEPARTMENT_OTHER): Payer: Medicare Other | Attending: Family Medicine | Admitting: Physical Therapy

## 2022-01-08 ENCOUNTER — Ambulatory Visit: Payer: Medicare Other

## 2022-01-08 DIAGNOSIS — R600 Localized edema: Secondary | ICD-10-CM | POA: Insufficient documentation

## 2022-01-08 DIAGNOSIS — M25561 Pain in right knee: Secondary | ICD-10-CM | POA: Insufficient documentation

## 2022-01-08 DIAGNOSIS — R262 Difficulty in walking, not elsewhere classified: Secondary | ICD-10-CM | POA: Diagnosis not present

## 2022-01-08 DIAGNOSIS — M25661 Stiffness of right knee, not elsewhere classified: Secondary | ICD-10-CM | POA: Diagnosis not present

## 2022-01-08 NOTE — Therapy (Addendum)
OUTPATIENT PHYSICAL THERAPY LOWER EXTREMITY TREATMENT NOTE   Patient Name: Heather Pierce MRN: [id number] DOB:[birth year], 71 y.o., female Today's Date: 01/08/2022   PT End of Session - 01/08/22 0957     Visit Number 5    Number of Visits 16    Date for PT Re-Evaluation 02/15/22    Authorization Type [employer] Medicare    PT Start Time 1517    PT Stop Time 1030    PT Time Calculation (min) 43 min    Activity Tolerance Patient tolerated treatment well    Behavior During Therapy [facility] for tasks assessed/performed             Past Medical History:  Diagnosis Date   Allergy    GERD (gastroesophageal reflux disease)    Hyperlipidemia    Hypertension    Past Surgical History:  Procedure Laterality Date   KNEE ARTHROSCOPY Right 11/29/2009   Patient Active Problem List   Diagnosis Date Noted   Melanoma of skin ([place]) 11/15/2021   Keratosis 11/15/2021   Acute pain of left knee 05/31/2019   Primary osteoarthritis of right knee 05/03/2019   Obesity (BMI 30-39.9) 07/15/2013   Hyperlipidemia 09/25/2010   Preventative health care 09/25/2010   GERD 03/15/2008   Essential hypertension 12/17/2007    PCP: Ann Held, DO  REFERRING PROVIDER: Rosemarie Ax, MD  REFERRING DIAG: M17.11 (ICD-10-CM) - Primary osteoarthritis of right knee  THERAPY DIAG:  Acute pain of right knee  Stiffness of right knee, not elsewhere classified  Localized edema  Difficulty in walking, not elsewhere classified  Rationale for Evaluation and Treatment Rehabilitation  ONSET DATE: ~ 2 years ago  SUBJECTIVE:   SUBJECTIVE STATEMENT: Pt reports that the IASTM to knee last session was painful, but helpful.    PERTINENT HISTORY: HTN, previous R knee surgery  PAIN:  Are you having pain? Yes: NPRS scale: 2/10 Pain location: R knee Pain description: ache -across joint line Aggravating factors: walking, twisting Relieving factors: ice/elevation/ibuprofen PRN  PRECAUTIONS:  None  WEIGHT BEARING RESTRICTIONS No  FALLS:  Has patient fallen in last 6 months? 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 Chronic recurrent medial meniscal tear. The medial meniscus is diffusely macerated with an enlarging parameniscal cyst medially. 2. Progressive advanced medial compartment osteoarthritis with prominent subchondral edema throughout the medial femoral condyle and medial tibial plateau. 3. Large complex knee joint effusion. 4. The lateral meniscus, cruciate and collateral ligaments are intact.  PATIENT SURVEYS:  LEFS 33/80 = 41% ability; 59% disability  COGNITION:  Overall cognitive status: Within functional limits for tasks assessed     SENSATION: WFL  EDEMA:  Edema on R, wearing compression brace around knee.  R 47 cm, L 49 cm circumferential at joint line.   MUSCLE LENGTH: Hamstrings: Right ~80 deg; Left 90 deg Ely's (prone knee bend for quadriceps): Right 95 deg; Left 105 deg  POSTURE: No Significant postural limitations  PALPATION: Tenderness R MCL, posterior knee (hamstrings), distal R patella at joint line  LOWER EXTREMITY ROM:  Passive ROM  Right 6/23 Left 6/23  Knee flexion 115 122  Knee extension 10 2   (Blank rows = not tested)  LOWER EXTREMITY MMT:  MMT Right eval Left eval  Hip flexion 4+ 4+  Hip extension 4+ 5  Hip abduction 5 5  Hip adduction 5 5  Knee flexion 5 5  Knee extension 15 deg quad lag, 3/5 limited by pain and swelling 5  Ankle dorsiflexion 5 5   (Blank rows = not  tested)  FUNCTIONAL TESTS:  5 times sit to stand: 14 sec without UE assist  GAIT: Distance walked: 60' Assistive device utilized: None Level of assistance: Complete Independence Comments: antalgic, decreased gait speed, RLE in external rotation, decreased heel strike, decreased stride length.     TODAY'S TREATMENT: 01/08/2022 Pt seen for aquatic therapy today.  Treatment took place in water 3.25-4.0 ft in depth at the [facility] pool. Temp of water was 91.  Pt entered/exited the pool independently via stairs with bilat rail.  Forward walking 2 laps Backward walking 2 laps Side stepping with arm abdct/add 2 laps Heel/toe raises x 15 Squats (10 slow, cues for form) Hip Abdct/ add x 10 x 2 (2nd set with increased speed) Hip ext x 10 L stretch Alternating hamstring curls x 10 Walking forward kicks holding yellow noodle Hip/knee flexion crossing midline R forward step up (5" step in 3 ft water) x 10 L step down and L/R retro step up (5 of each) Standing hamstring stretch with foot on 2nd step Once pt dry: application of reg Rock tape to Rt pes anserine area, and across tibial plateau (covering tibial tuberosity) with 25% stretch to decompress tissue and increase proprioception.    Pt requires the buoyancy and hydrostatic pressure of water for support, and to offload joints by unweighting joint load by at least 50 % in naval deep water and by at least 75-80% in chest to neck deep water.  Viscosity of the water is needed for resistance of strengthening. Water current perturbations provides challenge to standing balance requiring increased core activation.  01/02/2022 Therapeutic Exercise: to improve strength and mobility.  Demo, verbal and tactile cues throughout for technique. Nustep L5 x 6 min  Step downs (no riser) 2 x 10 bil  Side step downs (no riser) 2 x 10 bil  At counter-  Hip extension x 10 bil  Hip abduction x 10 bil  Ankle sways x 10 Mini-squats x 20 Manual  Therapy: to decrease muscle spasm and pain and improve mobility.  STM/IASTM with s/s tools to R MCL/pes anserine/patellar tendon, patellar mobs inf/super  12/26/2021 Therapeutic Exercise: to improve strength and mobility.   Bike L1 x 6 min  Seated straight leg raise 2 x 10 R Hamstring stretches sitting 2 x 30 sec bil  At counter Squats to chair 2 x 10  Heel raises x 10  Toe raises x 10  Hip extension x 10 bil    Hip abduction  x 10 R, x 5 L (increased R knee pain in stance).  Manual Therapy: to decrease muscle spasm and pain and improve mobility.  STM to R medial knee, self STM with massage stick to quads after demo.     PATIENT EDUCATION:  Education details: reviewed HEP modified to eliminate supine exercises and progressed standing exercises.  Person educated: Patient Education method: Explanation, Demonstration, Verbal cues, and Handouts Education comprehension: verbalized understanding and returned demonstration   HOME EXERCISE PROGRAM: Access Code: [email or web address]  ASSESSMENT:  CLINICAL IMPRESSION: Patient  tolerated all aquatic exercises well, with only occasional report of "grinding" near Rt patella.  She would benefit from continued skilled therapy. Progressing well towards goals.    OBJECTIVE IMPAIRMENTS Abnormal gait, decreased activity tolerance, decreased endurance, decreased mobility, difficulty walking, decreased ROM, decreased strength, increased edema, increased fascial restrictions, increased muscle spasms, and pain.   ACTIVITY LIMITATIONS lifting, bending, sitting, standing, squatting, sleeping, stairs, and transfers  PARTICIPATION LIMITATIONS: cleaning, laundry, shopping, community activity, yard work, and volunteering at [employer].   PERSONAL FACTORS Age, Time since onset of injury/illness/exacerbation, and 1-2 comorbidities: R meniscus tear (previous surgery) and obesity  are also affecting patient's functional outcome.   REHAB POTENTIAL: Good  CLINICAL  DECISION MAKING: Stable/uncomplicated  EVALUATION COMPLEXITY: Low   GOALS: Goals reviewed with patient? Yes  SHORT TERM GOALS: Target date: 01/04/2022  Patient will be independent with initial HEP. Baseline: given Goal status: IN PROGRESS  LONG TERM GOALS: Target date: 02/15/2022   Patient will be independent with advanced/ongoing HEP to improve outcomes and carryover.  Baseline: needs advancement as tolerated Goal status: IN PROGRESS  2.  Patient will report at least 75% improvement in R knee pain to improve QOL. Baseline: 5-10/10 R knee pain Goal status: IN PROGRESS  3.  Patient will demonstrate improved R knee AROM to >/= 2-120 deg to allow for normal gait and stair mechanics. Baseline: 10-115 Goal status: IN PROGRESS  4.  Patient will demonstrate improved functional LE strength as demonstrated by full R knee extension without quad lag. Baseline: 15 deg quad lag Goal status: IN PROGRESS  5.  Patient will be able to ambulate 1200' with LRAD and normal gait pattern without increased pain to access community.  Baseline: antalgic, slow, RLE in ER Goal status: IN PROGRESS  6. Patient will be able to ascend/descend stairs with 1 HR and reciprocal step pattern safely to access home and community.  Baseline: step to gait, avoids stairs or going up stairs due to pain Goal status: IN PROGRESS  7.  Patient will report <40% impairment on LEFS to demonstrate improved functional ability. Baseline: 33/80 indicates 59% disability  Goal status: IN PROGRESS  8.  Patient will be able to ambulate over grassy surfaces without increased R knee pain to volunteer at golf tournaments.  Baseline: unable due to R knee pain Goal status: IN PROGRESS   PLAN: PT FREQUENCY: 1-2x/week  PT DURATION: 8 weeks  PLANNED INTERVENTIONS: Therapeutic exercises, Therapeutic activity, Neuromuscular re-education, Balance training, Gait training, Patient/Family education, Joint mobilization, Stair training,  Aquatic Therapy, Dry Needling, Electrical stimulation, Cryotherapy, Moist heat, Taping, Vasopneumatic device, Ultrasound, Ionotophoresis '4mg'$ /ml Dexamethasone, and Manual therapy  PLAN FOR NEXT SESSION:  continue to progress R quad/hip strengthening, manual therapy/modalities PRN. Assess response to ktape.    Kerin Perna, PTA 01/08/22 10:31 AM

## 2022-01-10 ENCOUNTER — Ambulatory Visit: Payer: Medicare Other

## 2022-01-10 DIAGNOSIS — M25661 Stiffness of right knee, not elsewhere classified: Secondary | ICD-10-CM

## 2022-01-10 DIAGNOSIS — R262 Difficulty in walking, not elsewhere classified: Secondary | ICD-10-CM

## 2022-01-10 DIAGNOSIS — M25561 Pain in right knee: Secondary | ICD-10-CM

## 2022-01-10 DIAGNOSIS — R600 Localized edema: Secondary | ICD-10-CM

## 2022-01-10 NOTE — Therapy (Signed)
OUTPATIENT PHYSICAL THERAPY LOWER EXTREMITY TREATMENT NOTE   Patient Name: Heather Pierce MRN: [id number] DOB:1950-11-11, 71 y.o., female Today's Date: 01/10/2022   PT End of Session - 01/10/22 1702     Visit Number 6    Number of Visits 16    Date for PT Re-Evaluation 02/15/22    Authorization Type [employer] Medicare    PT Start Time 1616    PT Stop Time 8786    PT Time Calculation (min) 43 min    Activity Tolerance Patient tolerated treatment well    Behavior During Therapy WFL for tasks assessed/performed              Past Medical History:  Diagnosis Date   Allergy    GERD (gastroesophageal reflux disease)    Hyperlipidemia    Hypertension    Past Surgical History:  Procedure Laterality Date   KNEE ARTHROSCOPY Right 11/29/2009   Patient Active Problem List   Diagnosis Date Noted   Melanoma of skin ([place]) 11/15/2021   Keratosis 11/15/2021   Acute pain of left knee 05/31/2019   Primary osteoarthritis of right knee 05/03/2019   Obesity (BMI 30-39.9) 07/15/2013   Hyperlipidemia 09/25/2010   Preventative health care 09/25/2010   GERD 03/15/2008   Essential hypertension 12/17/2007    PCP: Ann Held, DO  REFERRING PROVIDER: Rosemarie Ax, MD  REFERRING DIAG: M17.11 (ICD-10-CM) - Primary osteoarthritis of right knee  THERAPY DIAG:  Acute pain of right knee  Stiffness of right knee, not elsewhere classified  Localized edema  Difficulty in walking, not elsewhere classified  Rationale for Evaluation and Treatment Rehabilitation  ONSET DATE: ~ 2 years ago  SUBJECTIVE:   SUBJECTIVE STATEMENT: Therapy has been helping, wearing KT tape on knee and it seems to be helping.  PERTINENT HISTORY: HTN, previous R knee surgery  PAIN:  Are you having pain? Yes: NPRS scale: 3/10 Pain location: R knee Pain description: ache -across joint line Aggravating factors: walking, twisting Relieving factors: ice/elevation/ibuprofen PRN  PRECAUTIONS:  None  WEIGHT BEARING RESTRICTIONS No  FALLS:  Has patient fallen in last 6 months? 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 Chronic recurrent medial meniscal tear. The medial meniscus is diffusely macerated with an enlarging parameniscal cyst medially. 2. Progressive advanced medial compartment osteoarthritis with prominent subchondral edema throughout the medial femoral condyle and medial tibial plateau. 3. Large complex knee joint effusion. 4. The lateral meniscus, cruciate and collateral ligaments are intact.  PATIENT SURVEYS:  LEFS 33/80 = 41% ability; 59% disability  COGNITION:  Overall cognitive status: Within functional limits for tasks assessed     SENSATION: WFL  EDEMA:  Edema on R, wearing compression brace around knee.  R 47 cm, L 49 cm circumferential at joint line.   MUSCLE LENGTH: Hamstrings: Right ~80 deg; Left 90 deg Ely's (prone knee bend for quadriceps): Right 95 deg; Left 105 deg  POSTURE: No Significant postural limitations  PALPATION: Tenderness R MCL, posterior knee (hamstrings), distal R patella at joint line  LOWER EXTREMITY ROM:  Passive  ROM Right 6/23 Left 6/23  Knee flexion 115 122  Knee extension 10 2   (Blank rows = not tested)  LOWER EXTREMITY MMT:  MMT Right eval Left eval  Hip flexion 4+ 4+  Hip extension 4+ 5  Hip abduction 5 5  Hip adduction 5 5  Knee flexion 5 5  Knee extension 15 deg quad lag, 3/5 limited by pain and swelling 5  Ankle dorsiflexion 5 5   (Blank rows = not  tested)  FUNCTIONAL TESTS:  5 times sit to stand: 14 sec without UE assist  GAIT: Distance walked: 68' Assistive device utilized: None Level of assistance: Complete Independence Comments: antalgic, decreased gait speed, RLE in external rotation, decreased heel strike, decreased stride length.     TODAY'S TREATMENT: 01/10/22 Therapeutic Exercise: Bike L2x70mn Fwd and lateral step up 6' 10x each R side Fwd step down 10x 4' Standing hip abduction red TB at knees 2x10 Standing hip extension red TB at knees 2x10 Squats at counter depth 10x Supine SLR with ER R LE 2x10 Bridges with arms crossed head slightly elevated 10x  01/08/2022 Pt seen for aquatic therapy today.  Treatment took place in water 3.25-4.0 ft [facility] Temp of water was 91.  Pt entered/exited the pool independently via stairs with bilat rail.  Forward walking 2 laps Backward walking 2 laps Side stepping with arm abdct/add 2 laps Heel/toe raises x 15 Squats (10 slow, cues for form) Hip Abdct/ add x 10 x 2 (2nd set with increased speed) Hip ext x 10 L stretch Alternating hamstring curls x 10 Walking forward kicks holding yellow noodle Hip/knee flexion crossing midline R forward step up (5" step in 3 ft water) x 10 L step down and L/R retro step up (5 of each) Standing hamstring stretch with foot on 2nd step Once pt dry: application of reg Rock tape to Rt pes anserine area, and across tibial plateau (covering tibial tuberosity) with 25% stretch to decompress tissue and increase proprioception.    Pt requires the buoyancy and hydrostatic pressure of water for support, and to offload joints by unweighting joint load by at least 50 % in naval deep water and by at least 75-80% in chest to neck deep water.  Viscosity of the water is needed for resistance of strengthening. Water current perturbations provides challenge to standing balance requiring increased core activation.  01/02/2022  Therapeutic Exercise: to improve strength and mobility.  Demo, verbal and tactile cues throughout for technique. Nustep L5 x 6 min  Step downs (no riser) 2 x 10 bil  Side step downs (no riser) 2 x 10 bil  At counter-  Hip extension x 10 bil  Hip abduction x 10 bil  Ankle sways x 10 Mini-squats x 20 Manual Therapy: to decrease muscle spasm and pain and improve mobility.  STM/IASTM with s/s tools to R MCL/pes anserine/patellar tendon, patellar mobs inf/super     PATIENT EDUCATION:  Education details: reviewed HEP modified to eliminate supine exercises and progressed standing exercises.  Person educated: Patient Education method: Explanation, Demonstration, Verbal cues, and Handouts Education comprehension: verbalized understanding and returned demonstration   HOME EXERCISE PROGRAM: Access Code: [email or web address] ASSESSMENT:  CLINICAL IMPRESSION: Pt responded well to the progression of exercises. Pt was able to complete a squat to 90 deg w/o pain today just reporting tightness on anterior knee. Cues for eccentric control required with bridges. Pt did note benefit from KT tape. She would benefit from continued  progression of exercises.   OBJECTIVE IMPAIRMENTS Abnormal gait, decreased activity tolerance, decreased endurance, decreased mobility, difficulty walking, decreased ROM, decreased strength, increased edema, increased fascial restrictions, increased muscle spasms, and pain.   ACTIVITY LIMITATIONS lifting, bending, sitting, standing, squatting, sleeping, stairs, and transfers  PARTICIPATION LIMITATIONS: cleaning, laundry, shopping, community activity, yard work, and volunteering at [employer].   PERSONAL FACTORS Age, Time since onset of injury/illness/exacerbation, and 1-2 comorbidities: R meniscus tear (previous surgery) and obesity  are also affecting patient's functional outcome.   REHAB POTENTIAL: Good  CLINICAL DECISION MAKING: Stable/uncomplicated  EVALUATION  COMPLEXITY: Low   GOALS: Goals reviewed with patient? Yes  SHORT TERM GOALS: Target date: 01/04/2022  Patient will be independent with initial HEP. Baseline: given Goal status: MET  LONG TERM GOALS: Target date: 02/15/2022   Patient will be independent with advanced/ongoing HEP to improve outcomes and carryover.  Baseline: needs advancement as tolerated Goal status: IN PROGRESS  2.  Patient will report at least 75% improvement in R knee pain to improve QOL. Baseline: 5-10/10 R knee pain Goal status: IN PROGRESS  3.  Patient will demonstrate improved R knee AROM to >/= 2-120 deg to allow for normal gait and stair mechanics. Baseline: 10-115 Goal status: IN PROGRESS  4.  Patient will demonstrate improved functional LE strength as demonstrated by full R knee extension without quad lag. Baseline: 15 deg quad lag Goal status: IN PROGRESS  5.  Patient will be able to ambulate 1200' with LRAD and normal gait pattern without increased pain to access community.  Baseline: antalgic, slow, RLE in ER Goal status: IN PROGRESS  6. Patient will be able to ascend/descend stairs with 1 HR and reciprocal step pattern safely to access home and community.  Baseline: step to gait, avoids stairs or going up stairs due to pain Goal status: IN PROGRESS  7.  Patient will report <40% impairment on LEFS to demonstrate improved functional ability. Baseline: 33/80 indicates 59% disability  Goal status: IN PROGRESS  8.  Patient will be able to ambulate over grassy surfaces without increased R knee pain to volunteer at golf tournaments.  Baseline: unable due to R knee pain Goal status: IN PROGRESS   PLAN: PT FREQUENCY: 1-2x/week  PT DURATION: 8 weeks  PLANNED INTERVENTIONS: Therapeutic exercises, Therapeutic activity, Neuromuscular re-education, Balance training, Gait training, Patient/Family education, Joint mobilization, Stair training, Aquatic Therapy, Dry Needling, Electrical stimulation,  Cryotherapy, Moist heat, Taping, Vasopneumatic device, Ultrasound, Ionotophoresis 51m/ml Dexamethasone, and Manual therapy  PLAN FOR NEXT SESSION:  continue to progress R quad/hip strengthening, manual therapy/modalities PRN. Assess response to ktape.    BClarene Essex PTA 01/10/22 5:05 PM

## 2022-01-15 ENCOUNTER — Other Ambulatory Visit: Payer: Self-pay | Admitting: Family Medicine

## 2022-01-15 ENCOUNTER — Encounter: Payer: Medicare Other | Admitting: Physical Therapy

## 2022-01-15 DIAGNOSIS — E785 Hyperlipidemia, unspecified: Secondary | ICD-10-CM

## 2022-01-16 ENCOUNTER — Encounter (HOSPITAL_BASED_OUTPATIENT_CLINIC_OR_DEPARTMENT_OTHER): Payer: Self-pay | Admitting: Physical Therapy

## 2022-01-16 ENCOUNTER — Ambulatory Visit (HOSPITAL_BASED_OUTPATIENT_CLINIC_OR_DEPARTMENT_OTHER): Payer: Medicare Other | Admitting: Physical Therapy

## 2022-01-16 DIAGNOSIS — M25561 Pain in right knee: Secondary | ICD-10-CM

## 2022-01-16 DIAGNOSIS — M25661 Stiffness of right knee, not elsewhere classified: Secondary | ICD-10-CM

## 2022-01-16 DIAGNOSIS — R262 Difficulty in walking, not elsewhere classified: Secondary | ICD-10-CM | POA: Diagnosis not present

## 2022-01-16 DIAGNOSIS — R600 Localized edema: Secondary | ICD-10-CM

## 2022-01-16 NOTE — Therapy (Signed)
OUTPATIENT PHYSICAL THERAPY LOWER EXTREMITY TREATMENT NOTE   Patient Name: Heather Pierce MRN: [id number] DOB:[birth year], 71 y.o., female Today's Date: 01/16/2022   PT End of Session - 01/16/22 0948     Visit Number 7    Number of Visits 16    Date for PT Re-Evaluation 02/15/22    Authorization Type [employer] Medicare    PT Start Time 0945    PT Stop Time 1024    PT Time Calculation (min) 39 min    Activity Tolerance Patient tolerated treatment well    Behavior During Therapy [facility] for tasks assessed/performed              Past Medical History:  Diagnosis Date   Allergy    GERD (gastroesophageal reflux disease)    Hyperlipidemia    Hypertension    Past Surgical History:  Procedure Laterality Date   KNEE ARTHROSCOPY Right 11/29/2009   Patient Active Problem List   Diagnosis Date Noted   Melanoma of skin ([place]) 11/15/2021   Keratosis 11/15/2021   Acute pain of left knee 05/31/2019   Primary osteoarthritis of right knee 05/03/2019   Obesity (BMI 30-39.9) 07/15/2013   Hyperlipidemia 09/25/2010   Preventative health care 09/25/2010   GERD 03/15/2008   Essential hypertension 12/17/2007    PCP: Ann Held, DO  REFERRING PROVIDER: Rosemarie Ax, MD  REFERRING DIAG: M17.11 (ICD-10-CM) - Primary osteoarthritis of right knee  THERAPY DIAG:  Acute pain of right knee  Stiffness of right knee, not elsewhere classified  Localized edema  Rationale for Evaluation and Treatment Rehabilitation  ONSET DATE: ~ 2 years ago  SUBJECTIVE:   SUBJECTIVE STATEMENT: "Tape has helped with reducing ache, but hasn't helped the popping". She has noticed her knee hurts less when she is wearing shoes with arch support and taking smaller steps.   She reports she bought [employer] brand of KT tape and has applied it 1x.   PERTINENT HISTORY: HTN, previous R knee surgery  PAIN:  Are you having pain? Yes: NPRS scale: 3-4/10 Pain location: R knee Pain description: ache -across  anterior joint line Aggravating factors: walking, twisting Relieving factors: ice/elevation/ibuprofen PRN  PRECAUTIONS: None  WEIGHT BEARING RESTRICTIONS No  FALLS:  Has patient fallen in last 6 months? 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 Chronic recurrent medial meniscal tear. The medial meniscus is diffusely macerated with an enlarging parameniscal cyst medially. 2. Progressive advanced medial compartment osteoarthritis with prominent subchondral edema throughout the medial femoral condyle and medial tibial plateau. 3. Large complex knee joint effusion. 4. The lateral meniscus, cruciate and collateral ligaments are intact.  PATIENT SURVEYS:  LEFS 33/80 = 41% ability; 59% disability  COGNITION:  Overall cognitive status: Within functional limits for tasks assessed     SENSATION: WFL  EDEMA:  Edema on R, wearing compression brace around knee.  R 47 cm, L 49 cm circumferential at joint line.   MUSCLE LENGTH: Hamstrings: Right ~80 deg; Left 90 deg Ely's (prone knee bend for quadriceps): Right 95 deg; Left 105 deg  POSTURE: No Significant postural limitations  PALPATION: Tenderness R MCL, posterior knee (hamstrings), distal R patella at joint line  LOWER EXTREMITY ROM:  Passive ROM Right 6/23 Left 6/23  Knee flexion 115 122  Knee extension 10 2   (Blank rows = not tested)  LOWER EXTREMITY MMT:  MMT Right eval Left eval  Hip flexion 4+ 4+  Hip extension 4+ 5  Hip abduction 5 5  Hip adduction 5 5  Knee  flexion 5 5  Knee extension 15 deg quad lag, 3/5 limited by pain and swelling 5  Ankle dorsiflexion 5 5   (Blank rows = not tested)  FUNCTIONAL TESTS:  5 times sit to stand: 14 sec without UE assist  GAIT: Distance walked: 42' Assistive device utilized: None Level of assistance: Complete Independence Comments: antalgic, decreased gait speed, RLE in external rotation, decreased heel strike, decreased stride length.     TODAY'S TREATMENT: 01/16/22 Pt seen for aquatic therapy today.  Treatment took place in water 3.25-4.0 ft in depth at the [facility] pool. Temp of water was 91.  Pt entered/exited the pool independently via stairs with bilat rail.  Forward walking 2 laps Backward walking 2 laps Side stepping with arm abdct/add 2 laps Heel/toe raises x 15 Squats (10 cues for form) Hip Abdct/ add x 20 with increased speed Hip ext x 12 Walking forward kicks holding yellow noodle Tandem gait forward/  backward holding yellow noodle Adductor stretch at wall  L step down and R retro step up (5 of each) Standing hamstring stretch with foot on 2nd step (with overpressure into ext on R) Once pt dry: application of reg Rock tape to Rt pes anserine area, and across tibial plateau (covering tibial tuberosity) with 25% stretch to decompress tissue and increase proprioception.    Pt requires the buoyancy and hydrostatic pressure of water for support, and to offload joints by unweighting joint load by at least 50 % in navel deep water and by at least 75-80% in chest to neck deep water.  Viscosity of the water is needed for resistance of strengthening. Water current perturbations provides challenge to standing balance requiring increased core activation.  01/10/22 Therapeutic Exercise: Bike L2x57mn Fwd and lateral step up 6' 10x each R side Fwd step down 10x 4' Standing hip abduction red TB at knees 2x10 Standing hip extension red TB at knees 2x10 Squats at counter depth 10x Supine  SLR with ER R LE 2x10 Bridges with arms crossed head slightly elevated 10x  01/02/2022 Therapeutic Exercise: to improve strength and mobility.  Demo, verbal and tactile cues throughout for technique. Nustep L5 x 6 min  Step downs (no riser) 2 x 10 bil  Side step downs (no riser) 2 x 10 bil  At counter-  Hip extension x 10 bil  Hip abduction x 10 bil  Ankle sways x 10 Mini-squats x 20 Manual Therapy: to decrease muscle spasm and pain and improve mobility.  STM/IASTM with s/s tools to R MCL/pes anserine/patellar tendon, patellar mobs inf/super     PATIENT EDUCATION:  Education details: reviewed HEP modified to eliminate supine exercises and progressed standing exercises.  Person educated: Patient Education method: Explanation, Demonstration, Verbal cues, and Handouts Education comprehension: verbalized understanding and returned demonstration   HOME EXERCISE PROGRAM: Access Code: [email or web address] ASSESSMENT:  CLINICAL IMPRESSION: Pt reported increased anterior Rt knee pain with forward tandem gait as well as retro step up with RLE.  Pain dissipated with stretches and change in exercise. Retaped pt's  Rt ant knee at end of session.  Pt is progressing towards all goals. Pt would benefit from continued progression of exercises.   OBJECTIVE IMPAIRMENTS Abnormal gait, decreased activity tolerance, decreased endurance, decreased mobility, difficulty walking, decreased ROM, decreased strength, increased edema, increased fascial restrictions, increased muscle spasms, and pain.   ACTIVITY LIMITATIONS lifting, bending, sitting, standing, squatting, sleeping, stairs, and transfers  PARTICIPATION LIMITATIONS: cleaning, laundry, shopping, community activity, yard work, and volunteering at [employer].   PERSONAL FACTORS Age, Time since onset of injury/illness/exacerbation, and 1-2 comorbidities: R meniscus tear (previous surgery) and obesity  are also affecting patient's functional outcome.    REHAB POTENTIAL: Good  CLINICAL DECISION MAKING: Stable/uncomplicated  EVALUATION COMPLEXITY: Low   GOALS: Goals reviewed with patient? Yes  SHORT TERM GOALS: Target date: 01/04/2022  Patient will be independent with initial HEP. Baseline: given Goal status: MET  LONG TERM GOALS: Target date: 02/15/2022   Patient will be independent with advanced/ongoing HEP to improve outcomes and carryover.  Baseline: needs advancement as tolerated Goal status: IN PROGRESS  2.  Patient will report at least 75% improvement in R knee pain to improve QOL. Baseline: 5-10/10 R knee pain Goal status: IN PROGRESS  3.  Patient will demonstrate improved R knee AROM to >/= 2-120 deg to allow for normal gait and stair mechanics. Baseline: 10-115 Goal status: IN PROGRESS  4.  Patient will demonstrate improved functional LE strength as demonstrated by full R knee extension without quad lag. Baseline: 15 deg quad lag Goal status: IN PROGRESS  5.  Patient will be able to ambulate 1200' with LRAD and normal gait pattern without increased pain to access community.  Baseline: antalgic, slow, RLE in ER Goal status: IN PROGRESS  6. Patient will be able to ascend/descend stairs with 1 HR and reciprocal step pattern safely to access home and community.  Baseline: step to gait, avoids stairs or going up stairs due to pain Goal status: IN PROGRESS  7.  Patient will report <40% impairment on LEFS to demonstrate improved functional ability. Baseline: 33/80 indicates 59% disability  Goal status: IN PROGRESS  8.  Patient will be able to ambulate over grassy surfaces without increased R knee pain to volunteer at golf tournaments.  Baseline: unable due to R knee pain Goal status: IN PROGRESS   PLAN: PT FREQUENCY: 1-2x/week  PT DURATION: 8 weeks  PLANNED INTERVENTIONS: Therapeutic exercises, Therapeutic activity, Neuromuscular re-education, Balance training, Gait training, Patient/Family education, Joint  mobilization, Stair training, Aquatic Therapy, Dry Needling, Electrical stimulation, Cryotherapy, Moist heat, Taping, Vasopneumatic device, Ultrasound, Ionotophoresis 58m/ml Dexamethasone, and Manual therapy  PLAN FOR NEXT SESSION:  continue to progress R quad/hip strengthening, manual therapy/modalities PRN.   MD note - pt returns at end of week to doctor for follow up.   JKerin Perna PTA 01/16/22 11:02 AM

## 2022-01-17 ENCOUNTER — Ambulatory Visit (INDEPENDENT_AMBULATORY_CARE_PROVIDER_SITE_OTHER): Payer: Medicare Other | Admitting: Family Medicine

## 2022-01-17 ENCOUNTER — Ambulatory Visit: Payer: Medicare Other | Admitting: Physical Therapy

## 2022-01-17 ENCOUNTER — Encounter: Payer: Self-pay | Admitting: Family Medicine

## 2022-01-17 ENCOUNTER — Encounter: Payer: Self-pay | Admitting: Physical Therapy

## 2022-01-17 DIAGNOSIS — M25661 Stiffness of right knee, not elsewhere classified: Secondary | ICD-10-CM

## 2022-01-17 DIAGNOSIS — M1711 Unilateral primary osteoarthritis, right knee: Secondary | ICD-10-CM

## 2022-01-17 DIAGNOSIS — R262 Difficulty in walking, not elsewhere classified: Secondary | ICD-10-CM

## 2022-01-17 DIAGNOSIS — R600 Localized edema: Secondary | ICD-10-CM | POA: Diagnosis not present

## 2022-01-17 DIAGNOSIS — M25561 Pain in right knee: Secondary | ICD-10-CM

## 2022-01-17 NOTE — Assessment & Plan Note (Signed)
Doing well since the injection and getting good improvement with physical therapy. -Counseled on home exercise therapy and supportive care. -Can repeat Zilretta if needed. Continue physical therapy.-

## 2022-01-17 NOTE — Progress Notes (Signed)
  Heather Pierce - 71 y.o. female MRN [id number]  Date of birth: 1950-11-11  SUBJECTIVE:  Including CC & ROS.  No chief complaint on file.   Heather Pierce is a 71 y.o. female that is following up after her Zilretta injection.  She is doing well with the injection as well as physical therapy.  She has been trying different levels of taping on the knee.   Review of Systems See HPI   HISTORY: Past Medical, Surgical, Social, and Family History Reviewed & Updated per EMR.   Pertinent Historical Findings include:  Past Medical History:  Diagnosis Date   Allergy    GERD (gastroesophageal reflux disease)    Hyperlipidemia    Hypertension     Past Surgical History:  Procedure Laterality Date   KNEE ARTHROSCOPY Right 11/29/2009     PHYSICAL EXAM:  VS: LMP 09/24/2005  Physical Exam Gen: NAD, alert, cooperative with exam, well-appearing MSK:  Neurovascularly intact       ASSESSMENT & PLAN:   Primary osteoarthritis of right knee Doing well since the injection and getting good improvement with physical therapy. -Counseled on home exercise therapy and supportive care. -Can repeat Zilretta if needed. Continue physical therapy.-

## 2022-01-17 NOTE — Therapy (Signed)
OUTPATIENT PHYSICAL THERAPY LOWER EXTREMITY TREATMENT NOTE  Progress Note Reporting Period 12/21/2021 to 01/17/2022  See note below for Objective Data and Assessment of Progress/Goals.     Patient Name: Heather Pierce MRN: [id number] DOB:04-02-1951, 71 y.o., female Today's Date: 01/17/2022   PT End of Session - 01/17/22 0851     Visit Number 8    Number of Visits 16    Date for PT Re-Evaluation 02/15/22    Authorization Type [employer] Medicare    PT Start Time 9417    PT Stop Time 0927    PT Time Calculation (min) 40 min    Activity Tolerance Patient tolerated treatment well    Behavior During Therapy [facility] for tasks assessed/performed              Past Medical History:  Diagnosis Date   Allergy    GERD (gastroesophageal reflux disease)    Hyperlipidemia    Hypertension    Past Surgical History:  Procedure Laterality Date   KNEE ARTHROSCOPY Right 11/29/2009   Patient Active Problem List   Diagnosis Date Noted   Melanoma of skin ([place]) 11/15/2021   Keratosis 11/15/2021   Acute pain of left knee 05/31/2019   Primary osteoarthritis of right knee 05/03/2019   Obesity (BMI 30-39.9) 07/15/2013   Hyperlipidemia 09/25/2010   Preventative health care 09/25/2010   GERD 03/15/2008   Essential hypertension 12/17/2007    PCP: Ann Held, DO  REFERRING PROVIDER: Rosemarie Ax, MD  REFERRING DIAG: M17.11 (ICD-10-CM) - Primary osteoarthritis of right knee  THERAPY DIAG:  Acute pain of right knee  Stiffness of right knee, not elsewhere classified  Localized edema  Difficulty in walking, not elsewhere classified  Rationale for Evaluation and Treatment Rehabilitation  ONSET DATE: ~ 2 years ago  SUBJECTIVE:   SUBJECTIVE STATEMENT: Pt reports tape is helping, not popping as much now.  Pain continues to improve.  Walked 1/2 mile with dog today.    PERTINENT HISTORY: HTN, previous R knee surgery  PAIN:  Are you having pain? Yes: NPRS scale:  2-3/10 Pain location: R knee Pain description: ache -across anterior joint line Aggravating factors: walking, twisting Relieving factors: ice/elevation/ibuprofen PRN  PRECAUTIONS: None  WEIGHT BEARING RESTRICTIONS No  FALLS:  Has patient fallen in last 6 months? 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 Chronic recurrent medial meniscal tear. The medial meniscus is diffusely macerated with an enlarging parameniscal cyst medially. 2. Progressive advanced medial compartment osteoarthritis with prominent subchondral edema throughout the medial femoral condyle and medial tibial plateau. 3. Large complex knee joint effusion. 4. The lateral meniscus, cruciate and collateral ligaments are intact.  PATIENT SURVEYS:  LEFS 33/80 = 41% ability; 59% disability  COGNITION:  Overall cognitive status: Within functional limits for tasks assessed     SENSATION: WFL  EDEMA:  Edema on R, wearing compression brace around knee.  R 47 cm, L 49 cm circumferential at joint line.   MUSCLE LENGTH: Hamstrings: Right ~80 deg; Left 90 deg Ely's (prone knee bend for quadriceps): Right  95 deg; Left 105 deg  POSTURE: No Significant postural limitations  PALPATION: Tenderness R MCL, posterior knee (hamstrings), distal R patella at joint line  LOWER EXTREMITY ROM:  Passive ROM Right 6/23 Left 6/23 Right 01/17/22  Knee flexion 115 122 120  Knee extension '10 2 2   ' (Blank rows = not tested)  LOWER EXTREMITY MMT:  MMT Right eval Left eval  Right 01/17/22  Hip flexion 4+ 4+ 5  Hip extension 4+ 5   Hip abduction 5 5   Hip adduction 5 5   Knee flexion 5 5   Knee extension 15 deg quad lag, 3/5 limited by pain and swelling 5 5, no quad lag  Ankle dorsiflexion 5 5    (Blank rows = not tested)  FUNCTIONAL TESTS:  5 times sit to stand: 14 sec without UE assist  GAIT: Distance walked: 49' Assistive device utilized: None Level of assistance: Complete Independence Comments: antalgic, decreased gait speed, RLE in external rotation, decreased heel strike, decreased stride length.     TODAY'S TREATMENT: 01/17/22 Therapeutic Exercise: to improve strength and mobility.  Bike L2 x 5 min  SLR 2 x 10 bil Squats with RTB 2 x 10 - cues needed for form Gait x 1200' Stairs - 1 flight Manual Therapy: to decrease muscle spasm and pain and improve mobility STM to rectus femoris and vastus lateralis.    01/16/22 Pt seen for aquatic therapy today.  Treatment took place in water 3.25-4.0 ft in depth at the [facility] pool. Temp of water was 91.  Pt entered/exited the pool independently via stairs with bilat rail.  Forward walking 2 laps Backward walking 2 laps Side stepping with arm abdct/add 2 laps Heel/toe raises x 15 Squats (10 cues for form) Hip Abdct/ add x 20 with increased speed Hip ext x 12 Walking forward kicks holding yellow noodle Tandem gait forward/  backward holding yellow noodle Adductor stretch at wall  L step down and R retro step up (5 of each) Standing hamstring stretch with foot on 2nd step (with overpressure into ext on R) Once pt dry: application of reg Rock tape to Rt pes anserine area, and across tibial plateau (covering tibial tuberosity) with 25% stretch to decompress tissue and increase proprioception.    Pt requires the buoyancy and hydrostatic pressure of water for support, and to offload joints by unweighting joint load by at least 50 % in navel deep water and by at least 75-80% in chest to  neck deep water.  Viscosity of the water is needed for resistance of strengthening. Water current perturbations provides challenge to standing balance requiring increased core activation.  01/10/22 Therapeutic Exercise: Bike L2x27mn Fwd and lateral step up 6' 10x each R side Fwd step down 10x 4' Standing hip abduction red TB at knees 2x10 Standing hip extension red TB at knees 2x10 Squats at counter depth 10x Supine SLR with ER R LE 2x10 Bridges with arms crossed head slightly elevated 10x   PATIENT EDUCATION:  Education details: discussed dry needling  Person educated: Patient Education method: Explanation Education comprehension: verbalized understanding   HOME EXERCISE PROGRAM: Access Code: [email or web address] ASSESSMENT:  CLINICAL IMPRESSION: DIdy Rawling is making good progress with improved R knee strength and ROM, meeting  LTG #3 and # 4.  She still has some medial knee pain after descending stairs and prolonged walking, but significantly improved since initial evaluation.  Discussed TrDN to R quad next session as an option, as noted palpable trigger  points in rectus and lateralis.  Pt would benefit from continued progression of exercises.   OBJECTIVE IMPAIRMENTS Abnormal gait, decreased activity tolerance, decreased endurance, decreased mobility, difficulty walking, decreased ROM, decreased strength, increased edema, increased fascial restrictions, increased muscle spasms, and pain.   ACTIVITY LIMITATIONS lifting, bending, sitting, standing, squatting, sleeping, stairs, and transfers  PARTICIPATION LIMITATIONS: cleaning, laundry, shopping, community activity, yard work, and volunteering at [employer].   PERSONAL FACTORS Age, Time since onset of injury/illness/exacerbation, and 1-2 comorbidities: R meniscus tear (previous surgery) and obesity  are also affecting patient's functional outcome.   REHAB POTENTIAL: Good  CLINICAL DECISION MAKING:  Stable/uncomplicated  EVALUATION COMPLEXITY: Low   GOALS: Goals reviewed with patient? Yes  SHORT TERM GOALS: Target date: 01/04/2022  Patient will be independent with initial HEP. Baseline: given Goal status: MET  LONG TERM GOALS: Target date: 02/15/2022   Patient will be independent with advanced/ongoing HEP to improve outcomes and carryover.  Baseline: needs advancement as tolerated Goal status: IN PROGRESS  2.  Patient will report at least 75% improvement in R knee pain to improve QOL. Baseline: 5-10/10 R knee pain Goal status: IN PROGRESS 01/17/22- 2-3/10   3.  Patient will demonstrate improved R knee AROM to >/= 2-120 deg to allow for normal gait and stair mechanics. Baseline: 10-115 Goal status: MET 2-120  4.  Patient will demonstrate improved functional LE strength as demonstrated by full R knee extension without quad lag. Baseline: 15 deg quad lag Goal status: MET no quad lag, full extension.  5.  Patient will be able to ambulate 1200' with LRAD and normal gait pattern without increased pain to access community.  Baseline: antalgic, slow, RLE in ER Goal status: IN PROGRESS 1200' in 6 minutes, mild pain medial knee at end.   6. Patient will be able to ascend/descend stairs with 1 HR and reciprocal step pattern safely to access home and community.  Baseline: step to gait, avoids stairs or going up stairs due to pain Goal status: IN PROGRESS 01/17/22 mild pain descending stairs, more crepitus.   7.  Patient will report <40% impairment on LEFS to demonstrate improved functional ability. Baseline: 33/80 indicates 59% disability  Goal status: IN PROGRESS  8.  Patient will be able to ambulate over grassy surfaces without increased R knee pain to volunteer at golf tournaments.  Baseline: unable due to R knee pain Goal status: IN PROGRESS   PLAN: PT FREQUENCY: 1-2x/week  PT DURATION: 8 weeks  PLANNED INTERVENTIONS: Therapeutic exercises, Therapeutic activity,  Neuromuscular re-education, Balance training, Gait training, Patient/Family education, Joint mobilization, Stair training, Aquatic Therapy, Dry Needling, Electrical stimulation, Cryotherapy, Moist heat, Taping, Vasopneumatic device, Ultrasound, Ionotophoresis 45m/ml Dexamethasone, and Manual therapy  PLAN FOR NEXT SESSION:  continue to progress R quad/hip strengthening, manual therapy/modalities PRN.      ERennie Natter PT, DPT   01/17/22 12:22 PM

## 2022-01-18 ENCOUNTER — Encounter: Payer: Medicare Other | Admitting: Physical Therapy

## 2022-01-22 ENCOUNTER — Ambulatory Visit: Payer: Medicare Other

## 2022-01-22 DIAGNOSIS — R600 Localized edema: Secondary | ICD-10-CM

## 2022-01-22 DIAGNOSIS — M25661 Stiffness of right knee, not elsewhere classified: Secondary | ICD-10-CM | POA: Diagnosis not present

## 2022-01-22 DIAGNOSIS — R262 Difficulty in walking, not elsewhere classified: Secondary | ICD-10-CM | POA: Diagnosis not present

## 2022-01-22 DIAGNOSIS — M25561 Pain in right knee: Secondary | ICD-10-CM | POA: Diagnosis not present

## 2022-01-22 NOTE — Therapy (Signed)
OUTPATIENT PHYSICAL THERAPY LOWER EXTREMITY TREATMENT NOTE     Patient Name: Heather Pierce MRN: [id number] DOB:[birth year], 71 y.o., female Today's Date: 01/22/2022   PT End of Session - 01/22/22 1502     Visit Number 9    Number of Visits 16    Date for PT Re-Evaluation 02/15/22    Authorization Type [employer] Medicare    PT Start Time 2878    PT Stop Time 6767    PT Time Calculation (min) 46 min    Activity Tolerance Patient tolerated treatment well    Behavior During Therapy [facility] for tasks assessed/performed               Past Medical History:  Diagnosis Date   Allergy    GERD (gastroesophageal reflux disease)    Hyperlipidemia    Hypertension    Past Surgical History:  Procedure Laterality Date   KNEE ARTHROSCOPY Right 11/29/2009   Patient Active Problem List   Diagnosis Date Noted   Melanoma of skin ([place]) 11/15/2021   Keratosis 11/15/2021   Acute pain of left knee 05/31/2019   Primary osteoarthritis of right knee 05/03/2019   Obesity (BMI 30-39.9) 07/15/2013   Hyperlipidemia 09/25/2010   Preventative health care 09/25/2010   GERD 03/15/2008   Essential hypertension 12/17/2007    PCP: Ann Held, DO  REFERRING PROVIDER: Rosemarie Ax, MD  REFERRING DIAG: M17.11 (ICD-10-CM) - Primary osteoarthritis of right knee  THERAPY DIAG:  Acute pain of right knee  Stiffness of right knee, not elsewhere classified  Localized edema  Difficulty in walking, not elsewhere classified  Rationale for Evaluation and Treatment Rehabilitation  ONSET DATE: ~ 2 years ago  SUBJECTIVE:   SUBJECTIVE STATEMENT: Pt reports that she did not wear KT tape or compression sleeve while walking the dog a couple of days, wanted to cut her leg off on Sunday.  PERTINENT HISTORY: HTN, previous R knee surgery  PAIN:  Are you having pain? Yes: NPRS scale: 3-4/10 Pain location: R knee Pain description: ache -across anterior joint line Aggravating factors: walking,  twisting Relieving factors: ice/elevation/ibuprofen PRN  PRECAUTIONS: None  WEIGHT BEARING RESTRICTIONS No  FALLS:  Has patient fallen in last 6 months? 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 Chronic recurrent medial meniscal tear. The medial meniscus is diffusely macerated with an enlarging parameniscal cyst medially. 2. Progressive advanced medial compartment osteoarthritis with prominent subchondral edema throughout the medial femoral condyle and medial tibial plateau. 3. Large complex knee joint effusion. 4. The lateral meniscus, cruciate and collateral ligaments are intact.  PATIENT SURVEYS:  LEFS 33/80 = 41% ability; 59% disability  COGNITION:  Overall cognitive status: Within functional limits for tasks assessed     SENSATION: WFL  EDEMA:  Edema on R, wearing compression brace around knee.  R 47 cm, L 49 cm circumferential at joint line.   MUSCLE LENGTH: Hamstrings: Right ~80 deg; Left 90 deg Ely's (prone knee bend for quadriceps): Right 95 deg; Left 105 deg  POSTURE: No Significant postural limitations  PALPATION: Tenderness  R MCL, posterior knee (hamstrings), distal R patella at joint line  LOWER EXTREMITY ROM:  Passive ROM Right 6/23 Left 6/23 Right 01/17/22  Knee flexion 115 122 120  Knee extension '10 2 2   ' (Blank rows = not tested)  LOWER EXTREMITY MMT:  MMT Right eval Left eval Right 01/17/22  Hip flexion 4+ 4+ 5  Hip extension 4+ 5   Hip abduction 5 5   Hip adduction 5 5   Knee flexion 5 5    Knee extension 15 deg quad lag, 3/5 limited by pain and swelling 5 5, no quad lag  Ankle dorsiflexion 5 5    (Blank rows = not tested)  FUNCTIONAL TESTS:  5 times sit to stand: 14 sec without UE assist  GAIT: Distance walked: 21' Assistive device utilized: None Level of assistance: Complete Independence Comments: antalgic, decreased gait speed, RLE in external rotation, decreased heel strike, decreased stride length.     TODAY'S TREATMENT: 01/22/22 Therapeutic Exercise: Nustep L5x7mn Counter squats 2x10 R step ups 6' x 20 Step downs leading with L LE x 20 15# BATCA knee flexion x 15  Manual Therapy: STM to R pes anserine, medial structures of knee, VMO  01/17/22 Therapeutic Exercise: to improve strength and mobility.  Bike L2 x 5 min  SLR 2 x 10 bil Squats with RTB 2 x 10 - cues needed for form Gait x 1200' Stairs - 1 flight Manual Therapy: to decrease muscle spasm and pain and improve mobility STM to rectus femoris and vastus lateralis.    01/16/22 Pt seen for aquatic therapy today.  Treatment took place in water 3.25-4.0 ft [facility] Temp of water was 91.  Pt entered/exited the pool independently via stairs with bilat rail.  Forward walking 2 laps Backward walking 2 laps Side stepping with arm abdct/add 2 laps Heel/toe raises x 15 Squats (10 cues for form) Hip Abdct/ add x 20 with increased speed Hip ext x 12 Walking forward kicks holding yellow noodle Tandem gait forward/  backward holding yellow noodle Adductor stretch at wall  L step down and R retro step up (5 of each) Standing hamstring stretch with foot on 2nd step (with overpressure into ext on R) Once pt dry: application of reg Rock tape to Rt pes anserine area, and across tibial plateau (covering tibial tuberosity) with 25% stretch to decompress tissue and increase proprioception.    Pt requires the buoyancy and hydrostatic pressure of water for support, and to offload  joints by unweighting joint load by at least 50 % in navel deep water and by at least 75-80% in chest to neck deep water.  Viscosity of the water is needed for resistance of strengthening. Water current perturbations provides challenge to standing balance requiring increased core activation.  01/10/22 Therapeutic Exercise: Bike L2x627m Fwd and lateral step up 6' 10x each R side Fwd step down 10x 4' Standing hip abduction red TB at knees 2x10 Standing hip extension red TB at knees 2x10 Squats at counter depth 10x Supine SLR with ER R LE 2x10 Bridges with arms crossed head slightly elevated 10x   PATIENT EDUCATION:  Education details: discussed dry needling  Person educated: Patient Education method: Explanation Education comprehension: verbalized understanding   HOME EXERCISE PROGRAM: Access Code: [email or web address]:  CLINICAL IMPRESSION: Continued working on CKKohl'suad strengthening to improve functional mobility. Pt had no instances of pain with exercises. Required modification to weighted HS curls to reduce knee flexion  to improve pain levels with exercise. MT done post session to reduce tightness and improve pain along the structures of the TF joint, most TTP and restrictions were found along the medial border of patella.  OBJECTIVE IMPAIRMENTS Abnormal gait, decreased activity tolerance, decreased endurance, decreased mobility, difficulty walking, decreased ROM, decreased strength, increased edema, increased fascial restrictions, increased muscle spasms, and pain.   ACTIVITY LIMITATIONS lifting, bending, sitting, standing, squatting, sleeping, stairs, and transfers  PARTICIPATION LIMITATIONS: cleaning, laundry, shopping, community activity, yard work, and volunteering at [employer].   PERSONAL FACTORS Age, Time since onset of injury/illness/exacerbation, and 1-2 comorbidities: R meniscus tear (previous surgery) and obesity  are also affecting patient's functional outcome.    REHAB POTENTIAL: Good  CLINICAL DECISION MAKING: Stable/uncomplicated  EVALUATION COMPLEXITY: Low   GOALS: Goals reviewed with patient? Yes  SHORT TERM GOALS: Target date: 01/04/2022  Patient will be independent with initial HEP. Baseline: given Goal status: MET  LONG TERM GOALS: Target date: 02/15/2022   Patient will be independent with advanced/ongoing HEP to improve outcomes and carryover.  Baseline: needs advancement as tolerated Goal status: IN PROGRESS  2.  Patient will report at least 75% improvement in R knee pain to improve QOL. Baseline: 5-10/10 R knee pain Goal status: IN PROGRESS 01/17/22- 2-3/10   3.  Patient will demonstrate improved R knee AROM to >/= 2-120 deg to allow for normal gait and stair mechanics. Baseline: 10-115 Goal status: MET 2-120  4.  Patient will demonstrate improved functional LE strength as demonstrated by full R knee extension without quad lag. Baseline: 15 deg quad lag Goal status: MET no quad lag, full extension.  5.  Patient will be able to ambulate 1200' with LRAD and normal gait pattern without increased pain to access community.  Baseline: antalgic, slow, RLE in ER Goal status: IN PROGRESS 1200' in 6 minutes, mild pain medial knee at end.   6. Patient will be able to ascend/descend stairs with 1 HR and reciprocal step pattern safely to access home and community.  Baseline: step to gait, avoids stairs or going up stairs due to pain Goal status: IN PROGRESS 01/17/22 mild pain descending stairs, more crepitus.   7.  Patient will report <40% impairment on LEFS to demonstrate improved functional ability. Baseline: 33/80 indicates 59% disability  Goal status: IN PROGRESS  8.  Patient will be able to ambulate over grassy surfaces without increased R knee pain to volunteer at golf tournaments.  Baseline: unable due to R knee pain Goal status: IN PROGRESS   PLAN: PT FREQUENCY: 1-2x/week  PT DURATION: 8 weeks  PLANNED INTERVENTIONS:  Therapeutic exercises, Therapeutic activity, Neuromuscular re-education, Balance training, Gait training, Patient/Family education, Joint mobilization, Stair training, Aquatic Therapy, Dry Needling, Electrical stimulation, Cryotherapy, Moist heat, Taping, Vasopneumatic device, Ultrasound, Ionotophoresis 45m/ml Dexamethasone, and Manual therapy  PLAN FOR NEXT SESSION:  continue to progress R quad/hip strengthening, manual therapy/modalities PRN.      BArtist Pais PTA 01/22/22 3:05 PM

## 2022-01-25 ENCOUNTER — Ambulatory Visit: Payer: Medicare Other

## 2022-01-25 DIAGNOSIS — R262 Difficulty in walking, not elsewhere classified: Secondary | ICD-10-CM | POA: Diagnosis not present

## 2022-01-25 DIAGNOSIS — M25661 Stiffness of right knee, not elsewhere classified: Secondary | ICD-10-CM | POA: Diagnosis not present

## 2022-01-25 DIAGNOSIS — R600 Localized edema: Secondary | ICD-10-CM

## 2022-01-25 DIAGNOSIS — M25561 Pain in right knee: Secondary | ICD-10-CM | POA: Diagnosis not present

## 2022-01-25 NOTE — Therapy (Signed)
OUTPATIENT PHYSICAL THERAPY LOWER EXTREMITY TREATMENT NOTE     Patient Name: Heather Pierce MRN: [id number] DOB:Sep 22, 1950, 71 y.o., female Today's Date: 01/25/2022   PT End of Session - 01/25/22 1156     Visit Number 10    Number of Visits 16    Date for PT Re-Evaluation 02/15/22    Authorization Type [employer] Medicare    PT Start Time 1103    PT Stop Time 1145    PT Time Calculation (min) 42 min    Activity Tolerance Patient tolerated treatment well    Behavior During Therapy WFL for tasks assessed/performed                Past Medical History:  Diagnosis Date   Allergy    GERD (gastroesophageal reflux disease)    Hyperlipidemia    Hypertension    Past Surgical History:  Procedure Laterality Date   KNEE ARTHROSCOPY Right 11/29/2009   Patient Active Problem List   Diagnosis Date Noted   Melanoma of skin ([place]) 11/15/2021   Keratosis 11/15/2021   Acute pain of left knee 05/31/2019   Primary osteoarthritis of right knee 05/03/2019   Obesity (BMI 30-39.9) 07/15/2013   Hyperlipidemia 09/25/2010   Preventative health care 09/25/2010   GERD 03/15/2008   Essential hypertension 12/17/2007    PCP: Ann Held, DO  REFERRING PROVIDER: Rosemarie Ax, MD  REFERRING DIAG: M17.11 (ICD-10-CM) - Primary osteoarthritis of right knee  THERAPY DIAG:  Acute pain of right knee  Stiffness of right knee, not elsewhere classified  Localized edema  Difficulty in walking, not elsewhere classified  Rationale for Evaluation and Treatment Rehabilitation  ONSET DATE: ~ 2 years ago  SUBJECTIVE:   SUBJECTIVE STATEMENT: Pt reports a little more pain in knee this morning, no tape on it today.  PERTINENT HISTORY: HTN, previous R knee surgery  PAIN:  Are you having pain? Yes: NPRS scale: 3-4/10 Pain location: R knee Pain description: ache -across anterior joint line Aggravating factors: walking, twisting Relieving factors: ice/elevation/ibuprofen  PRN  PRECAUTIONS: None  WEIGHT BEARING RESTRICTIONS No  FALLS:  Has patient fallen in last 6 months? 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 Chronic recurrent medial meniscal tear. The medial meniscus is diffusely macerated with an enlarging parameniscal cyst medially. 2. Progressive advanced medial compartment osteoarthritis with prominent subchondral edema throughout the medial femoral condyle and medial tibial plateau. 3. Large complex knee joint effusion. 4. The lateral meniscus, cruciate and collateral ligaments are intact.  PATIENT SURVEYS:  LEFS 33/80 = 41% ability; 59% disability  COGNITION:  Overall cognitive status: Within functional limits for tasks assessed     SENSATION: WFL  EDEMA:  Edema on R, wearing compression brace around knee.  R 47 cm, L 49 cm circumferential at joint line.   MUSCLE LENGTH: Hamstrings: Right ~80 deg; Left 90 deg Ely's (prone knee bend for quadriceps): Right 95 deg; Left 105 deg  POSTURE: No Significant postural limitations  PALPATION: Tenderness R MCL, posterior knee (hamstrings), distal R patella at joint line  LOWER EXTREMITY ROM:  Passive ROM Right 6/23 Left 6/23 Right 01/17/22  Knee flexion 115 122 120  Knee extension _0 (Blank rows = not tested)  LOWER EXTREMITY MMT:  MMT Right eval Left eval Right 01/17/22  Hip flexion 4+ 4+ 5  Hip extension 4+ 5   Hip abduction 5 5   Hip adduction 5 5   Knee flexion 5 5   Knee extension 15 deg quad lag, 3/5 limited by pain  and swelling 5 5, no quad lag  Ankle dorsiflexion 5 5    (Blank rows = not tested)  FUNCTIONAL TESTS:  5 times sit to stand: 14 sec without UE assist  GAIT: Distance walked: 73' Assistive device utilized: None Level of assistance: Complete Independence Comments: antalgic, decreased gait speed, RLE in external rotation, decreased heel strike, decreased stride length.     TODAY'S TREATMENT: 01/25/22 TE: Supine SAQ with VMO ball squeeze x 10 Supine SLR with ER x 10  MT: STM to R VL, RF, VMO, pes anserine, medial border of patella S/L quad stretch manual x 30 sec  01/22/22 Therapeutic Exercise: Nustep L5x41mn Counter squats 2x10 R step ups 6' x 20 Step downs leading with L LE x 20 15# BATCA knee flexion x 15  Manual Therapy: STM to R pes anserine, medial structures of knee, VMO  01/17/22 Therapeutic Exercise: to improve strength and mobility.  Bike L2 x 5 min  SLR 2 x 10 bil Squats with RTB 2 x 10 - cues needed for form Gait x 1200' Stairs - 1 flight Manual Therapy: to decrease muscle spasm and pain and improve mobility STM to rectus femoris and vastus lateralis.    01/16/22 Pt seen for aquatic therapy today.  Treatment took place in water 3.25-4.0 ft [facility] Temp of water was 91.  Pt entered/exited the pool independently via stairs with bilat rail.  Forward walking 2 laps Backward walking 2 laps Side stepping with arm abdct/add 2 laps Heel/toe raises x 15 Squats (10 cues for form) Hip Abdct/ add x 20 with increased speed Hip ext x 12 Walking forward kicks holding yellow noodle Tandem gait forward/  backward holding yellow noodle Adductor stretch at wall  L step down and R retro step up (5 of each) Standing hamstring stretch with foot on 2nd step (with overpressure into ext on R) Once pt dry: application of reg [place] tape to Rt pes anserine area, and across tibial plateau (covering tibial tuberosity) with 25% stretch to  decompress tissue and increase proprioception.    Pt requires the buoyancy and hydrostatic pressure of water for support, and to offload joints by unweighting joint load by at least 50 % in navel deep water and by at least 75-80% in chest to neck deep water.  Viscosity of the water is needed for resistance of strengthening. Water current perturbations provides challenge to standing balance requiring increased core activation.  01/10/22 Therapeutic Exercise: Bike L2x618m Fwd and lateral step up 6' 10x each R side Fwd step down 10x 4' Standing hip abduction red TB at knees 2x10 Standing hip extension red TB at knees 2x10 Squats at counter depth 10x Supine SLR with ER R LE 2x10 Bridges with arms crossed head slightly elevated 10x   PATIENT EDUCATION:  Education details: discussed dry needling  Person educated: Patient Education method: Explanation Education comprehension: verbalized understanding   HOME EXERCISE PROGRAM: Access Code: [email or web address]:  CLINICAL IMPRESSION: Pt reported  that lately her R knee has been bothering her more and notices her patella moving around more w/o tape - she arrived w/o KT tape today. Pt was still tender along the medial border of patella, pes anserine, and distal quads. She demonstrated a good response to MT. Focused exercises on VMO targeted movements improve patellar tracking and improve knee stability.  OBJECTIVE IMPAIRMENTS Abnormal gait, decreased activity tolerance, decreased endurance, decreased mobility, difficulty walking, decreased ROM, decreased strength, increased edema, increased fascial restrictions, increased muscle spasms, and pain.   ACTIVITY LIMITATIONS lifting, bending, sitting, standing, squatting, sleeping, stairs, and transfers  PARTICIPATION LIMITATIONS: cleaning, laundry, shopping, community activity, yard work, and volunteering at [employer].   PERSONAL FACTORS Age, Time since onset of injury/illness/exacerbation,  and 1-2 comorbidities: R meniscus tear (previous surgery) and obesity  are also affecting patient's functional outcome.   REHAB POTENTIAL: Good  CLINICAL DECISION MAKING: Stable/uncomplicated  EVALUATION COMPLEXITY: Low   GOALS: Goals reviewed with patient? Yes  SHORT TERM GOALS: Target date: 01/04/2022  Patient will be independent with initial HEP. Baseline: given Goal status: MET  LONG TERM GOALS: Target date: 02/15/2022   Patient will be independent with advanced/ongoing HEP to improve outcomes and carryover.  Baseline: needs advancement as tolerated Goal status: IN PROGRESS  2.  Patient will report at least 75% improvement in R knee pain to improve QOL. Baseline: 5-10/10 R knee pain Goal status: IN PROGRESS 01/17/22- 2-3/10   3.  Patient will demonstrate improved R knee AROM to >/= 2-120 deg to allow for normal gait and stair mechanics. Baseline: 10-115 Goal status: MET 2-120  4.  Patient will demonstrate improved functional LE strength as demonstrated by full R knee extension without quad lag. Baseline: 15 deg quad lag Goal status: MET no quad lag, full extension.  5.  Patient will be able to ambulate 1200' with LRAD and normal gait pattern without increased pain to access community.  Baseline: antalgic, slow, RLE in ER Goal status: IN PROGRESS 1200' in 6 minutes, mild pain medial knee at end.   6. Patient will be able to ascend/descend stairs with 1 HR and reciprocal step pattern safely to access home and community.  Baseline: step to gait, avoids stairs or going up stairs due to pain Goal status: IN PROGRESS 01/17/22 mild pain descending stairs, more crepitus.   7.  Patient will report <40% impairment on LEFS to demonstrate improved functional ability. Baseline: 33/80 indicates 59% disability  Goal status: IN PROGRESS  8.  Patient will be able to ambulate over grassy surfaces without increased R knee pain to volunteer at golf tournaments.  Baseline: unable due to R  knee pain Goal status: IN PROGRESS   PLAN: PT FREQUENCY: 1-2x/week  PT DURATION: 8 weeks  PLANNED INTERVENTIONS: Therapeutic exercises, Therapeutic activity, Neuromuscular re-education, Balance training, Gait training, Patient/Family education, Joint mobilization, Stair training, Aquatic Therapy, Dry Needling, Electrical stimulation, Cryotherapy, Moist heat, Taping, Vasopneumatic device, Ultrasound, Ionotophoresis 53m/ml Dexamethasone, and Manual therapy  PLAN FOR NEXT SESSION:  continue to progress R quad/hip strengthening, manual therapy/modalities PRN.      BArtist Pais PTA 01/25/22 12:03 PM

## 2022-02-01 ENCOUNTER — Encounter: Payer: Medicare Other | Admitting: Physical Therapy

## 2022-02-01 ENCOUNTER — Ambulatory Visit (HOSPITAL_BASED_OUTPATIENT_CLINIC_OR_DEPARTMENT_OTHER): Payer: Medicare Other | Attending: Family Medicine | Admitting: Physical Therapy

## 2022-02-01 ENCOUNTER — Encounter (HOSPITAL_BASED_OUTPATIENT_CLINIC_OR_DEPARTMENT_OTHER): Payer: Self-pay | Admitting: Physical Therapy

## 2022-02-01 DIAGNOSIS — R262 Difficulty in walking, not elsewhere classified: Secondary | ICD-10-CM | POA: Diagnosis not present

## 2022-02-01 DIAGNOSIS — M25661 Stiffness of right knee, not elsewhere classified: Secondary | ICD-10-CM | POA: Diagnosis not present

## 2022-02-01 DIAGNOSIS — M25561 Pain in right knee: Secondary | ICD-10-CM | POA: Insufficient documentation

## 2022-02-01 DIAGNOSIS — R600 Localized edema: Secondary | ICD-10-CM | POA: Diagnosis not present

## 2022-02-01 NOTE — Therapy (Signed)
OUTPATIENT PHYSICAL THERAPY LOWER EXTREMITY TREATMENT NOTE     Patient Name: Heather Pierce MRN: [id number] DOB:1950/08/10, 71 y.o., female Today's Date: 02/01/2022   PT End of Session - 02/01/22 0958     Visit Number 11    Number of Visits 16    Date for PT Re-Evaluation 02/15/22    Authorization Type [employer] Medicare    PT Start Time 7482    PT Stop Time 1030    PT Time Calculation (min) 38 min    Activity Tolerance Patient tolerated treatment well    Behavior During Therapy [facility] for tasks assessed/performed                Past Medical History:  Diagnosis Date   Allergy    GERD (gastroesophageal reflux disease)    Hyperlipidemia    Hypertension    Past Surgical History:  Procedure Laterality Date   KNEE ARTHROSCOPY Right 11/29/2009   Patient Active Problem List   Diagnosis Date Noted   Melanoma of skin ([place]) 11/15/2021   Keratosis 11/15/2021   Acute pain of left knee 05/31/2019   Primary osteoarthritis of right knee 05/03/2019   Obesity (BMI 30-39.9) 07/15/2013   Hyperlipidemia 09/25/2010   Preventative health care 09/25/2010   GERD 03/15/2008   Essential hypertension 12/17/2007    PCP: Ann Held, DO  REFERRING PROVIDER: Rosemarie Ax, MD  REFERRING DIAG: M17.11 (ICD-10-CM) - Primary osteoarthritis of right knee  THERAPY DIAG:  Acute pain of right knee  Stiffness of right knee, not elsewhere classified  Localized edema  Difficulty in walking, not elsewhere classified  Rationale for Evaluation and Treatment Rehabilitation  ONSET DATE: ~ 2 years ago  SUBJECTIVE:   SUBJECTIVE STATEMENT: Pt reports that she hasn't worn tape since she was at beach. She walked 1 mile on beach with tennis shoes; no pain.  She states that the massage performed at last PT session helped tremendously. She still has "problems with going down stairs".   PERTINENT HISTORY: HTN, previous R knee surgery  PAIN:  Are you having pain? No: NPRS scale:  0/10 Pain location:  Pain description: ache  Aggravating factors: walking, twisting Relieving factors: ice/elevation/ibuprofen PRN  PRECAUTIONS: None  WEIGHT BEARING RESTRICTIONS No  FALLS:  Has patient fallen in last 6 months? 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 Chronic recurrent medial meniscal tear. The medial meniscus is diffusely macerated with an enlarging parameniscal cyst medially. 2. Progressive advanced medial compartment osteoarthritis with prominent subchondral edema throughout the medial femoral condyle and medial tibial plateau. 3. Large complex knee joint effusion. 4. The lateral meniscus, cruciate and collateral ligaments are intact.  PATIENT SURVEYS:  LEFS 33/80 = 41% ability; 59% disability  COGNITION:  Overall cognitive status: Within functional limits for tasks assessed     SENSATION: WFL  EDEMA:  Edema on R, wearing compression brace around knee.  R 47 cm, L 49 cm circumferential at joint line.   MUSCLE LENGTH: Hamstrings: Right ~80 deg; Left 90 deg Ely's (prone knee bend for quadriceps): Right  95 deg; Left 105 deg  POSTURE: No Significant postural limitations  PALPATION: Tenderness R MCL, posterior knee (hamstrings), distal R patella at joint line  LOWER EXTREMITY ROM:  Passive ROM Right 6/23 Left 6/23 Right 01/17/22  Knee flexion 115 122 120  Knee extension '10 2 2   ' (Blank rows = not tested)  LOWER EXTREMITY MMT:  MMT Right eval Left eval Right 01/17/22  Hip flexion 4+ 4+ 5  Hip  extension 4+ 5   Hip abduction 5 5   Hip adduction 5 5   Knee flexion 5 5   Knee extension 15 deg quad lag, 3/5 limited by pain and swelling 5 5, no quad lag  Ankle dorsiflexion 5 5    (Blank rows = not tested)  FUNCTIONAL TESTS:  5 times sit to stand: 14 sec without UE assist  GAIT: Distance walked: 40' Assistive device utilized: None Level of assistance: Complete Independence Comments: antalgic, decreased gait speed, RLE in external rotation, decreased heel strike, decreased stride length.     TODAY'S TREATMENT:  02/01/22 Pt seen for aquatic therapy today.  Treatment took place in water 3.25-4.0 ft in depth at the [facility] pool. Temp of water was 91.  Pt entered/exited the pool independently via stairs with bilat rail.  Forward/ backward walking, multiple laps High knee marching forward/ backward  Side stepping with arm abdct/add 3 laps Lateral step up/ down (straddling step) leading with R/ L Step down with LLE, retro up RLE (some pain in R medial knee)  Squats x 10 Hip Abdct/ add x 20 with increased speed Hip ext x 12 Standing hamstring stretch with foot on 2nd step (with overpressure into ext on R) Standing quad stretch with foot on 2nd step behind her, holding rails  Pt requires the buoyancy and hydrostatic pressure of water for support, and to offload joints by unweighting joint load by at least 50 % in navel deep water and by at least 75-80% in chest to neck deep water.  Viscosity of the water is needed for resistance of strengthening. Water current perturbations provides challenge to standing balance requiring increased core activation.  01/25/22 TE: Supine SAQ with VMO ball squeeze x 10 Supine SLR with ER x 10  MT: STM to R VL, RF, VMO, pes anserine, medial border of patella S/L quad stretch manual x 30 sec  01/22/22 Therapeutic Exercise: Nustep L5x82mn Counter squats 2x10 R step ups 6' x 20 Step downs leading with L LE x 20 15# BATCA knee  flexion x 15  Manual Therapy: STM to R pes anserine, medial structures of knee, VMO  01/17/22 Therapeutic Exercise: to improve strength and mobility.  Bike L2 x 5 min  SLR 2 x 10 bil Squats with RTB 2 x 10 - cues needed for form Gait x 1200' Stairs - 1 flight Manual Therapy: to decrease muscle spasm and pain and improve mobility STM to rectus femoris and vastus lateralis.   PATIENT EDUCATION:  Education details: aquatic exercise progression  Person educated: Patient Education method: Explanation Education comprehension: verbalized understanding   HOME EXERCISE PROGRAM: Access Code: [email or web address] ASSESSMENT:  CLINICAL IMPRESSION: Pt remained pain free except for with retro step ups with RLE and lateral step ups with RLE.  Pain resolved with change in exercise.  Added 2 LE stretches to HEP; will need new handout at next visit.  Pt independent with aquatic exercises.    OBJECTIVE IMPAIRMENTS Abnormal gait, decreased activity tolerance, decreased endurance, decreased mobility, difficulty walking, decreased  ROM, decreased strength, increased edema, increased fascial restrictions, increased muscle spasms, and pain.   ACTIVITY LIMITATIONS lifting, bending, sitting, standing, squatting, sleeping, stairs, and transfers  PARTICIPATION LIMITATIONS: cleaning, laundry, shopping, community activity, yard work, and volunteering at [employer].   PERSONAL FACTORS Age, Time since onset of injury/illness/exacerbation, and 1-2 comorbidities: R meniscus tear (previous surgery) and obesity  are also affecting patient's functional outcome.   REHAB POTENTIAL: Good  CLINICAL DECISION MAKING: Stable/uncomplicated  EVALUATION COMPLEXITY: Low   GOALS: Goals reviewed with patient? Yes  SHORT TERM GOALS: Target date: 01/04/2022  Patient will be independent with initial HEP. Baseline: given Goal status: MET  LONG TERM GOALS: Target date: 02/15/2022   Patient will be independent with  advanced/ongoing HEP to improve outcomes and carryover.  Baseline: needs advancement as tolerated Goal status: IN PROGRESS  2.  Patient will report at least 75% improvement in R knee pain to improve QOL. Baseline: 5-10/10 R knee pain Goal status: IN PROGRESS 01/17/22- 2-3/10   3.  Patient will demonstrate improved R knee AROM to >/= 2-120 deg to allow for normal gait and stair mechanics. Baseline: 10-115 Goal status: MET 2-120  4.  Patient will demonstrate improved functional LE strength as demonstrated by full R knee extension without quad lag. Baseline: 15 deg quad lag Goal status: MET no quad lag, full extension.  5.  Patient will be able to ambulate 1200' with LRAD and normal gait pattern without increased pain to access community.  Baseline: antalgic, slow, RLE in ER Goal status: IN PROGRESS 1200' in 6 minutes, mild pain medial knee at end.   6. Patient will be able to ascend/descend stairs with 1 HR and reciprocal step pattern safely to access home and community.  Baseline: step to gait, avoids stairs or going up stairs due to pain Goal status: IN PROGRESS 01/17/22 mild pain descending stairs, more crepitus.   7.  Patient will report <40% impairment on LEFS to demonstrate improved functional ability. Baseline: 33/80 indicates 59% disability  Goal status: IN PROGRESS  8.  Patient will be able to ambulate over grassy surfaces without increased R knee pain to volunteer at golf tournaments.  Baseline: unable due to R knee pain Goal status: IN PROGRESS   PLAN: PT FREQUENCY: 1-2x/week  PT DURATION: 8 weeks  PLANNED INTERVENTIONS: Therapeutic exercises, Therapeutic activity, Neuromuscular re-education, Balance training, Gait training, Patient/Family education, Joint mobilization, Stair training, Aquatic Therapy, Dry Needling, Electrical stimulation, Cryotherapy, Moist heat, Taping, Vasopneumatic device, Ultrasound, Ionotophoresis 36m/ml Dexamethasone, and Manual therapy  PLAN FOR  NEXT SESSION:  continue to progress R quad/hip strengthening, manual therapy/modalities PRN.      JKerin Perna PTA 02/01/22 1:10 PM

## 2022-02-05 ENCOUNTER — Ambulatory Visit: Payer: Medicare Other | Attending: Family Medicine | Admitting: Physical Therapy

## 2022-02-05 ENCOUNTER — Encounter: Payer: Self-pay | Admitting: Physical Therapy

## 2022-02-05 DIAGNOSIS — M25561 Pain in right knee: Secondary | ICD-10-CM | POA: Diagnosis not present

## 2022-02-05 DIAGNOSIS — R600 Localized edema: Secondary | ICD-10-CM | POA: Diagnosis not present

## 2022-02-05 DIAGNOSIS — M25661 Stiffness of right knee, not elsewhere classified: Secondary | ICD-10-CM | POA: Insufficient documentation

## 2022-02-05 DIAGNOSIS — R262 Difficulty in walking, not elsewhere classified: Secondary | ICD-10-CM | POA: Diagnosis not present

## 2022-02-05 NOTE — Patient Instructions (Signed)

## 2022-02-05 NOTE — Therapy (Signed)
OUTPATIENT PHYSICAL THERAPY TREATMENT NOTE     Patient Name: Heather Pierce MRN: [id number] DOB:1951-04-15, 71 y.o., female Today's Date: 02/05/2022   PT End of Session - 02/05/22 1407     Visit Number 12    Number of Visits 16    Date for PT Re-Evaluation 02/15/22    Authorization Type [employer]    Progress Note Due on Visit 16    PT Start Time 1404    PT Stop Time 1455    PT Time Calculation (min) 51 min    Activity Tolerance Patient tolerated treatment well    Behavior During Therapy WFL for tasks assessed/performed                Past Medical History:  Diagnosis Date   Allergy    GERD (gastroesophageal reflux disease)    Hyperlipidemia    Hypertension    Past Surgical History:  Procedure Laterality Date   KNEE ARTHROSCOPY Right 11/29/2009   Patient Active Problem List   Diagnosis Date Noted   Melanoma of skin ([place]) 11/15/2021   Keratosis 11/15/2021   Acute pain of left knee 05/31/2019   Primary osteoarthritis of right knee 05/03/2019   Obesity (BMI 30-39.9) 07/15/2013   Hyperlipidemia 09/25/2010   Preventative health care 09/25/2010   GERD 03/15/2008   Essential hypertension 12/17/2007    PCP: Ann Held, DO  REFERRING PROVIDER: Rosemarie Ax, MD  REFERRING DIAG: M17.11 (ICD-10-CM) - Primary osteoarthritis of right knee  THERAPY DIAG:  Acute pain of right knee  Stiffness of right knee, not elsewhere classified  Localized edema  Difficulty in walking, not elsewhere classified  Rationale for Evaluation and Treatment Rehabilitation  ONSET DATE: ~ 2 years ago  SUBJECTIVE:   SUBJECTIVE STATEMENT: Pt. Reports still doing well, tape helps, going down stairs is the hardest part.  Back to walking more.  Knows knee is "there" rather than real pain.    PERTINENT HISTORY: HTN, previous R knee surgery  PAIN:  Are you having pain? No: NPRS scale: 1-2/10 Pain location:  Pain description: ache  Aggravating factors: walking,  twisting Relieving factors: ice/elevation/ibuprofen PRN  PRECAUTIONS: None  WEIGHT BEARING RESTRICTIONS No  FALLS:  Has patient fallen in last 6 months? 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 Chronic recurrent medial meniscal tear. The medial meniscus is diffusely macerated with an enlarging parameniscal cyst medially. 2. Progressive advanced medial compartment osteoarthritis with prominent subchondral edema throughout the medial femoral condyle and medial tibial plateau. 3. Large complex knee joint effusion. 4. The lateral meniscus, cruciate and collateral ligaments are intact.  PATIENT SURVEYS:  LEFS 33/80 = 41% ability; 59% disability  COGNITION:  Overall cognitive status: Within functional limits for tasks assessed     SENSATION: WFL  EDEMA:  Edema on R, wearing compression brace around knee.  R 47 cm, L 49 cm circumferential at joint line.   MUSCLE LENGTH: Hamstrings: Right ~80 deg; Left 90 deg Ely's (prone knee bend for quadriceps): Right 95 deg; Left 105 deg  POSTURE: No  Significant postural limitations  PALPATION: Tenderness R MCL, posterior knee (hamstrings), distal R patella at joint line  LOWER EXTREMITY ROM:  Passive ROM Right 6/23 Left 6/23 Right 01/17/22  Knee flexion 115 122 120  Knee extension '10 2 2   ' (Blank rows = not tested)  LOWER EXTREMITY MMT:  MMT Right eval Left eval Right 01/17/22  Hip flexion 4+ 4+ 5  Hip extension 4+ 5   Hip abduction 5 5   Hip adduction 5 5   Knee flexion 5 5    Knee extension 15 deg quad lag, 3/5 limited by pain and swelling 5 5, no quad lag  Ankle dorsiflexion 5 5    (Blank rows = not tested)  FUNCTIONAL TESTS:  5 times sit to stand: 14 sec without UE assist  GAIT: Distance walked: 42' Assistive device utilized: None Level of assistance: Complete Independence Comments: antalgic, decreased gait speed, RLE in external rotation, decreased heel strike, decreased stride length.     TODAY'S TREATMENT: 02/05/22 Therapeutic Exercise: to improve strength and mobility.   Nustep L5 x 6 min   Isometric wall squats 3 x 10 sec hold, 5 x 15 sec hold  Step down, 2" step 1UE support, 3 x 10 bil   Star excursion pattern 180 deg x 10 bil   Heel raises 2 x 10 leaning against wall  Review of current HEP and progression.   Manual Therapy: to decrease muscle spasm and pain and improve mobility STM/TPR R  quad, skilled palpation and monitoring during dry needling. Trigger Point Dry-Needling  Treatment instructions: Expect mild to moderate muscle soreness. Patient verbalized understanding of these instructions and education.  Patient Consent Given: Yes Education handout provided: Yes Muscles treated: R quad Treatment response/outcome: Twitch Response Elicited and Palpable Increase in Muscle Length   02/01/22 Pt seen for aquatic therapy today.  Treatment took place in water 3.25-4.0 ft in depth at the [facility] pool. Temp of water was 91.  Pt entered/exited the pool independently via stairs with bilat rail.  Forward/ backward walking, multiple laps High knee marching forward/ backward  Side stepping with arm abdct/add 3 laps Lateral step up/ down (straddling step) leading with R/ L Step down with LLE, retro up RLE (some pain in R medial knee)  Squats x 10 Hip Abdct/ add x 20 with increased speed Hip ext x 12 Standing hamstring stretch with foot on 2nd step (with overpressure into ext on R) Standing quad stretch with foot on 2nd step behind  her, holding rails  Pt requires the buoyancy and hydrostatic pressure of water for support, and to offload joints by unweighting joint load by at least 50 % in navel deep water and by at least 75-80% in chest to neck deep water.  Viscosity of the water is needed for resistance of strengthening. Water current perturbations provides challenge to standing balance requiring increased core activation.  01/25/22 TE: Supine SAQ with VMO ball squeeze x 10 Supine SLR with ER x 10  MT: STM to R VL, RF, VMO, pes anserine, medial border of patella S/L quad stretch manual x 30 sec   PATIENT EDUCATION:  Education details: education on dry needling  Person educated: Patient Education method: Explanation and Handouts Education comprehension: verbalized understanding   HOME EXERCISE PROGRAM: Access Code: [email or web address]  ASSESSMENT:  CLINICAL IMPRESSION: Devora Tortorella is making good progress towards goals reporting decreased knee pain and tolerance to exercise.  Today focused on progressing quad strengthening exercises, eccentric control and SLS activities  for glute strengthening, tolerated exercises well without increased knee pain.  Noted large knot in R quad that she has been taping, After explanation of DN rational, procedures, outcomes and potential side effects, patient verbalized consent to DN treatment in conjunction with manual STM/DTM and TPR to reduce ttp/muscle tension.  However, there was no twitch response from R quad, advised to continue applying Ktape to area to see if improves through gentle stretch.  Antasia Haider continues to demonstrate potential for improvement and would benefit from continued skilled therapy to address impairments.      OBJECTIVE IMPAIRMENTS Abnormal gait, decreased activity tolerance, decreased endurance, decreased mobility, difficulty walking, decreased ROM, decreased strength, increased edema, increased fascial restrictions, increased muscle spasms, and pain.    ACTIVITY LIMITATIONS lifting, bending, sitting, standing, squatting, sleeping, stairs, and transfers  PARTICIPATION LIMITATIONS: cleaning, laundry, shopping, community activity, yard work, and volunteering at [employer].   PERSONAL FACTORS Age, Time since onset of injury/illness/exacerbation, and 1-2 comorbidities: R meniscus tear (previous surgery) and obesity  are also affecting patient's functional outcome.   REHAB POTENTIAL: Good  CLINICAL DECISION MAKING: Stable/uncomplicated  EVALUATION COMPLEXITY: Low   GOALS: Goals reviewed with patient? Yes  SHORT TERM GOALS: Target date: 01/04/2022  Patient will be independent with initial HEP. Baseline: given Goal status: MET  LONG TERM GOALS: Target date: 02/15/2022   Patient will be independent with advanced/ongoing HEP to improve outcomes and carryover.  Baseline: needs advancement as tolerated Goal status: IN PROGRESS  2.  Patient will report at least 75% improvement in R knee pain to improve QOL. Baseline: 5-10/10 R knee pain Goal status: IN PROGRESS 01/17/22- 2-3/10   3.  Patient will demonstrate improved R knee AROM to >/= 2-120 deg to allow for normal gait and stair mechanics. Baseline: 10-115 Goal status: MET 2-120  4.  Patient will demonstrate improved functional LE strength as demonstrated by full R knee extension without quad lag. Baseline: 15 deg quad lag Goal status: MET no quad lag, full extension.  5.  Patient will be able to ambulate 1200' with LRAD and normal gait pattern without increased pain to access community.  Baseline: antalgic, slow, RLE in ER Goal status: IN PROGRESS 1200' in 6 minutes, mild pain medial knee at end.   6. Patient will be able to ascend/descend stairs with 1 HR and reciprocal step pattern safely to access home and community.  Baseline: step to gait, avoids stairs or going up stairs due to pain Goal status: IN PROGRESS 01/17/22 mild pain descending stairs, more crepitus.   7.   Patient will report <40% impairment on LEFS to demonstrate improved functional ability. Baseline: 33/80 indicates 59% disability  Goal status: IN PROGRESS  8.  Patient will be able to ambulate over grassy surfaces without increased R knee pain to volunteer at golf tournaments.  Baseline: unable due to R knee pain Goal status: IN PROGRESS   PLAN: PT FREQUENCY: 1-2x/week  PT DURATION: 8 weeks  PLANNED INTERVENTIONS: Therapeutic exercises, Therapeutic activity, Neuromuscular re-education, Balance training, Gait training, Patient/Family education, Joint mobilization, Stair training, Aquatic Therapy, Dry Needling, Electrical stimulation, Cryotherapy, Moist heat, Taping, Vasopneumatic device, Ultrasound, Ionotophoresis 28m/ml Dexamethasone, and Manual therapy  PLAN FOR NEXT SESSION:  continue to progress R quad/hip strengthening, manual therapy/modalities PRN.      ERennie Natter PT, DPT  02/05/22 6:18 PM

## 2022-02-07 ENCOUNTER — Ambulatory Visit: Payer: Medicare Other

## 2022-02-07 DIAGNOSIS — R262 Difficulty in walking, not elsewhere classified: Secondary | ICD-10-CM

## 2022-02-07 DIAGNOSIS — M25561 Pain in right knee: Secondary | ICD-10-CM | POA: Diagnosis not present

## 2022-02-07 DIAGNOSIS — M25661 Stiffness of right knee, not elsewhere classified: Secondary | ICD-10-CM

## 2022-02-07 DIAGNOSIS — R600 Localized edema: Secondary | ICD-10-CM

## 2022-02-07 NOTE — Therapy (Signed)
OUTPATIENT PHYSICAL THERAPY TREATMENT NOTE     Patient Name: Heather Pierce MRN: [id number] DOB:09-02-1950, 71 y.o., female Today's Date: 02/07/2022   PT End of Session - 02/07/22 1535     Visit Number 13    Number of Visits 16    Date for PT Re-Evaluation 02/15/22    Authorization Type [employer] Medicare    Progress Note Due on Visit 16    PT Start Time 1449    PT Stop Time 1530    PT Time Calculation (min) 41 min    Activity Tolerance Patient tolerated treatment well    Behavior During Therapy WFL for tasks assessed/performed                 Past Medical History:  Diagnosis Date   Allergy    GERD (gastroesophageal reflux disease)    Hyperlipidemia    Hypertension    Past Surgical History:  Procedure Laterality Date   KNEE ARTHROSCOPY Right 11/29/2009   Patient Active Problem List   Diagnosis Date Noted   Melanoma of skin ([place]) 11/15/2021   Keratosis 11/15/2021   Acute pain of left knee 05/31/2019   Primary osteoarthritis of right knee 05/03/2019   Obesity (BMI 30-39.9) 07/15/2013   Hyperlipidemia 09/25/2010   Preventative health care 09/25/2010   GERD 03/15/2008   Essential hypertension 12/17/2007    PCP: Ann Held, DO  REFERRING PROVIDER: Rosemarie Ax, MD  REFERRING DIAG: M17.11 (ICD-10-CM) - Primary osteoarthritis of right knee  THERAPY DIAG:  Acute pain of right knee  Stiffness of right knee, not elsewhere classified  Localized edema  Difficulty in walking, not elsewhere classified  Rationale for Evaluation and Treatment Rehabilitation  ONSET DATE: ~ 2 years ago  SUBJECTIVE:   SUBJECTIVE STATEMENT: Doing good , was able to do stairs w/o pain going up.  PERTINENT HISTORY: HTN, previous R knee surgery  PAIN:  Are you having pain? No: NPRS scale: 0/10 Pain location:  Pain description: ache  Aggravating factors: walking, twisting Relieving factors: ice/elevation/ibuprofen PRN  PRECAUTIONS: None  WEIGHT BEARING  RESTRICTIONS No  FALLS:  Has patient fallen in last 6 months? 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 Chronic recurrent medial meniscal tear. The medial meniscus is diffusely macerated with an enlarging parameniscal cyst medially. 2. Progressive advanced medial compartment osteoarthritis with prominent subchondral edema throughout the medial femoral condyle and medial tibial plateau. 3. Large complex knee joint effusion. 4. The lateral meniscus, cruciate and collateral ligaments are intact.  PATIENT SURVEYS:  LEFS 33/80 = 41% ability; 59% disability  COGNITION:  Overall cognitive status: Within functional limits for tasks assessed     SENSATION: WFL  EDEMA:  Edema on R, wearing compression brace around knee.  R 47 cm, L 49 cm circumferential at joint line.   MUSCLE LENGTH: Hamstrings: Right ~80 deg; Left 90 deg Ely's (prone knee bend for quadriceps): Right 95 deg; Left 105 deg  POSTURE: No Significant postural limitations  PALPATION: Tenderness R MCL, posterior knee (hamstrings), distal R patella at joint line  LOWER EXTREMITY ROM:  Passive ROM Right 6/23 Left 6/23 Right 01/17/22  Knee flexion 115 122 120  Knee extension _0 (Blank rows = not tested)  LOWER EXTREMITY MMT:  MMT Right eval Left eval Right 01/17/22  Hip flexion 4+ 4+ 5  Hip extension 4+ 5   Hip abduction 5 5   Hip adduction 5 5   Knee flexion 5 5   Knee extension 15 deg quad lag, 3/5 limited by pain and swelling 5 5, no quad lag  Ankle  dorsiflexion 5 5    (Blank rows = not tested)  FUNCTIONAL TESTS:  5 times sit to stand: 14 sec without UE assist  GAIT: Distance walked: 61' Assistive device utilized: None Level of assistance: Complete Independence Comments: antalgic, decreased gait speed, RLE in external rotation, decreased heel strike, decreased stride length.     TODAY'S TREATMENT: 02/07/22 Therapeutic Exercise: Rec Bike [id number] Ecc step down 2x10 on 4' step  Wall squats 10x5", 1 rep for 20 sec hold Golfers lifts x 10 bil  Manual Therapy: STM to R medial knee, VL, RF  02/05/22 Therapeutic Exercise: to improve strength and mobility.   Nustep L5 x 6 min   Isometric wall squats 3 x 10 sec hold, 5 x 15 sec hold  Step down, 2" step 1UE support, 3 x 10 bil   Star excursion pattern 180 deg x 10 bil   Heel raises 2 x 10 leaning against wall  Review of current HEP and progression.   Manual Therapy: to decrease muscle spasm and pain and improve mobility STM/TPR R  quad, skilled palpation and monitoring during dry needling. Trigger Point Dry-Needling  Treatment instructions: Expect mild to moderate muscle soreness. Patient verbalized understanding of these instructions and education.  Patient Consent Given: Yes Education handout provided: Yes Muscles treated: R quad Treatment response/outcome: Twitch Response Elicited and Palpable Increase in Muscle Length   02/01/22 Pt seen for aquatic therapy today.  Treatment took place in water 3.25-4.0 ft [facility] Temp of water was 91.  Pt entered/exited the pool independently via stairs with bilat rail.  Forward/ backward walking, multiple laps High knee marching forward/ backward  Side stepping with arm abdct/add 3 laps Lateral step up/ down (straddling step) leading with R/ L Step down with LLE, retro up RLE (some pain in R medial knee)  Squats x 10 Hip Abdct/ add x 20 with increased speed Hip ext x 12 Standing hamstring stretch  with foot on 2nd step (with overpressure into ext on R) Standing quad stretch with foot on 2nd step behind her, holding rails  Pt requires the buoyancy and hydrostatic pressure of water for support, and to offload joints by unweighting joint load by at least 50 % in navel deep water and by at least 75-80% in chest to neck deep water.  Viscosity of the water is needed for resistance of strengthening. Water current perturbations provides challenge to standing balance requiring increased core activation.  01/25/22 TE: Supine SAQ with VMO ball squeeze x 10 Supine SLR with ER x 10  MT: STM to R VL, RF, VMO, pes anserine, medial border of patella S/L quad stretch manual x 30 sec   PATIENT EDUCATION:  Education details: education on dry needling  Person educated: Patient Education method: Explanation and Handouts Education comprehension: verbalized understanding   HOME EXERCISE PROGRAM: Access Code: [email or web address] ASSESSMENT:  CLINICAL IMPRESSION:  Pt responded well to treatment. Still noting  some popping and difficulty w/ eccentric step downs. Progressed eccentric quad strengthening to improve strength and functional mobility w/ stairs. Good response to MT, she continues to have restrictions along the VL and RF but better post manual.    OBJECTIVE IMPAIRMENTS Abnormal gait, decreased activity tolerance, decreased endurance, decreased mobility, difficulty walking, decreased ROM, decreased strength, increased edema, increased fascial restrictions, increased muscle spasms, and pain.   ACTIVITY LIMITATIONS lifting, bending, sitting, standing, squatting, sleeping, stairs, and transfers  PARTICIPATION LIMITATIONS: cleaning, laundry, shopping, community activity, yard work, and volunteering at [employer].   PERSONAL FACTORS Age, Time since onset of injury/illness/exacerbation, and 1-2 comorbidities: R meniscus tear (previous surgery) and obesity  are also affecting patient's functional  outcome.   REHAB POTENTIAL: Good  CLINICAL DECISION MAKING: Stable/uncomplicated  EVALUATION COMPLEXITY: Low   GOALS: Goals reviewed with patient? Yes  SHORT TERM GOALS: Target date: 01/04/2022  Patient will be independent with initial HEP. Baseline: given Goal status: MET  LONG TERM GOALS: Target date: 02/15/2022   Patient will be independent with advanced/ongoing HEP to improve outcomes and carryover.  Baseline: needs advancement as tolerated Goal status: IN PROGRESS  2.  Patient will report at least 75% improvement in R knee pain to improve QOL. Baseline: 5-10/10 R knee pain Goal status: IN PROGRESS 01/17/22- 2-3/10   3.  Patient will demonstrate improved R knee AROM to >/= 2-120 deg to allow for normal gait and stair mechanics. Baseline: 10-115 Goal status: MET 2-120  4.  Patient will demonstrate improved functional LE strength as demonstrated by full R knee extension without quad lag. Baseline: 15 deg quad lag Goal status: MET no quad lag, full extension.  5.  Patient will be able to ambulate 1200' with LRAD and normal gait pattern without increased pain to access community.  Baseline: antalgic, slow, RLE in ER Goal status: IN PROGRESS 1200' in 6 minutes, mild pain medial knee at end.   6. Patient will be able to ascend/descend stairs with 1 HR and reciprocal step pattern safely to access home and community.  Baseline: step to gait, avoids stairs or going up stairs due to pain Goal status: IN PROGRESS 01/17/22 mild pain descending stairs, more crepitus.   7.  Patient will report <40% impairment on LEFS to demonstrate improved functional ability. Baseline: 33/80 indicates 59% disability  Goal status: IN PROGRESS  8.  Patient will be able to ambulate over grassy surfaces without increased R knee pain to volunteer at golf tournaments.  Baseline: unable due to R knee pain Goal status: IN PROGRESS   PLAN: PT FREQUENCY: 1-2x/week  PT DURATION: 8 weeks  PLANNED  INTERVENTIONS: Therapeutic exercises, Therapeutic activity, Neuromuscular re-education, Balance training, Gait training, Patient/Family education, Joint mobilization, Stair training, Aquatic Therapy, Dry Needling, Electrical stimulation, Cryotherapy, Moist heat, Taping, Vasopneumatic device, Ultrasound, Ionotophoresis 24m/ml Dexamethasone, and Manual therapy  PLAN FOR NEXT SESSION:  continue to progress R quad/hip strengthening, manual therapy/modalities PRN.      BArtist Pais PTA 02/07/22 3:36 PM

## 2022-02-13 ENCOUNTER — Ambulatory Visit: Payer: Medicare Other | Admitting: Physical Therapy

## 2022-02-20 ENCOUNTER — Ambulatory Visit: Payer: Medicare Other | Admitting: Physical Therapy

## 2022-02-20 ENCOUNTER — Encounter: Payer: Self-pay | Admitting: Physical Therapy

## 2022-02-20 DIAGNOSIS — M25561 Pain in right knee: Secondary | ICD-10-CM

## 2022-02-20 DIAGNOSIS — R262 Difficulty in walking, not elsewhere classified: Secondary | ICD-10-CM | POA: Diagnosis not present

## 2022-02-20 DIAGNOSIS — M25661 Stiffness of right knee, not elsewhere classified: Secondary | ICD-10-CM | POA: Diagnosis not present

## 2022-02-20 DIAGNOSIS — R600 Localized edema: Secondary | ICD-10-CM | POA: Diagnosis not present

## 2022-02-20 NOTE — Therapy (Signed)
OUTPATIENT PHYSICAL THERAPY TREATMENT NOTE  PHYSICAL THERAPY DISCHARGE SUMMARY  Visits from Start of Care: 14  Current functional level related to goals / functional outcomes: Decreased R knee pain, improved strength, all goals met, LEFS improved form 33/80 to 63/80.    Remaining deficits: Intermittent R medial knee pain, primarily on stairs.    Education / Equipment: HEP  Plan: Patient agrees to discharge.  Patient is being discharged due to meeting the stated rehab goals.       Patient Name: Heather Pierce MRN: [id number] DOB:Aug 08, 1950, 71 y.o., female Today's Date: 02/20/2022   PT End of Session - 02/20/22 1532     Visit Number 14    Number of Visits 16    Date for PT Re-Evaluation 02/15/22    Authorization Type [employer]    Progress Note Due on Visit 90    PT Start Time 1528    PT Stop Time 1617    PT Time Calculation (min) 49 min    Activity Tolerance Patient tolerated treatment well    Behavior During Therapy WFL for tasks assessed/performed                 Past Medical History:  Diagnosis Date   Allergy    GERD (gastroesophageal reflux disease)    Hyperlipidemia    Hypertension    Past Surgical History:  Procedure Laterality Date   KNEE ARTHROSCOPY Right 11/29/2009   Patient Active Problem List   Diagnosis Date Noted   Melanoma of skin ([place]) 11/15/2021   Keratosis 11/15/2021   Acute pain of left knee 05/31/2019   Primary osteoarthritis of right knee 05/03/2019   Obesity (BMI 30-39.9) 07/15/2013   Hyperlipidemia 09/25/2010   Preventative health care 09/25/2010   GERD 03/15/2008   Essential hypertension 12/17/2007    PCP: Ann Held, DO  REFERRING PROVIDER: Rosemarie Ax, MD  REFERRING DIAG: M17.11 (ICD-10-CM) - Primary osteoarthritis of right knee  THERAPY DIAG:  Acute pain of right knee  Stiffness of right knee, not elsewhere classified  Localized edema  Difficulty in walking, not elsewhere  classified  Rationale for Evaluation and Treatment Rehabilitation  ONSET DATE: ~ 2 years ago  SUBJECTIVE:   SUBJECTIVE STATEMENT: Patient reports knee has good days and bad days, popping still bothers her.  Would love for it to not  hurt at all but pain is very manageable.  Weekend had some sharp pain in medial knee but stopped.  The KT tape seems to make it feel better.   She puts the KT tape on if she knows going to be doing more walking.  Feels ready for discharge today.  PERTINENT HISTORY: HTN, previous R knee surgery  PAIN:  Are you having pain? No: NPRS scale: 0/10 Pain location:  Pain description: ache  Aggravating factors: walking, twisting Relieving factors: ice/elevation/ibuprofen PRN  PRECAUTIONS: None  WEIGHT BEARING RESTRICTIONS No  FALLS:  Has patient fallen in last 6 months? 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 Chronic recurrent medial meniscal tear. The medial meniscus  is diffusely macerated with an enlarging parameniscal cyst medially. 2. Progressive advanced medial compartment osteoarthritis with prominent subchondral edema throughout the medial femoral condyle and medial tibial plateau. 3. Large complex knee joint effusion. 4. The lateral meniscus, cruciate and collateral ligaments are intact.  PATIENT SURVEYS:  LEFS 33/80 = 41% ability; 59% disability  COGNITION:  Overall cognitive status: Within functional limits for tasks assessed     SENSATION: WFL  EDEMA:  Edema on R, wearing compression  brace around knee.  R 47 cm, L 49 cm circumferential at joint line.   MUSCLE LENGTH: Hamstrings: Right ~80 deg; Left 90 deg Ely's (prone knee bend for quadriceps): Right 95 deg; Left 105 deg  POSTURE: No Significant postural limitations  PALPATION: Tenderness R MCL, posterior knee (hamstrings), distal R patella at joint line  LOWER EXTREMITY ROM:  Passive ROM Right 6/23 Left 6/23 Right 01/17/22  Knee flexion 115 122 120  Knee extension '10 2 2   ' (Blank rows = not tested)  LOWER EXTREMITY MMT:  MMT Right eval Left eval Right 01/17/22  Hip flexion 4+ 4+ 5  Hip extension 4+ 5   Hip abduction 5 5   Hip adduction 5 5   Knee flexion 5 5   Knee extension 15 deg quad lag, 3/5 limited by pain and swelling 5 5, no quad lag  Ankle dorsiflexion 5 5    (Blank rows = not tested)  FUNCTIONAL TESTS:  5 times sit to stand: 14 sec without UE assist  GAIT: Distance walked: 51' Assistive device utilized: None Level of assistance: Complete Independence Comments: antalgic, decreased gait speed, RLE in external rotation, decreased heel strike, decreased stride length.     TODAY'S TREATMENT: 02/20/2022 Therapeutic Activity:  to review goals and progress.    Nustep L5 x 6 min  2 flights stairs reciprocal step and 1 HR Gait x 1000' over grassy surfaces Review of HEP and plan for future Recommendation for [facility] aerobic arthritis class. LEFS KT tape R knee - demonstrated another taping method and how to tear tape to avoid touching sticky part.    02/07/22 Therapeutic Exercise: Rec Bike L2x39mn Ecc step down 2x10 on 4' step  Wall squats 10x5", 1 rep for 20 sec hold Golfers lifts x 10 bil  Manual Therapy: STM to R medial knee, VL, RF  02/05/22 Therapeutic Exercise: to improve strength and mobility.   Nustep L5 x 6 min   Isometric wall squats 3 x 10 sec hold, 5 x 15 sec hold  Step down, 2" step 1UE support, 3 x 10 bil   Star excursion pattern 180 deg x 10 bil   Heel raises 2 x 10  leaning against wall  Review of current HEP and progression.   Manual Therapy: to decrease muscle spasm and pain and improve mobility STM/TPR R  quad, skilled palpation and monitoring during dry needling. Trigger Point Dry-Needling  Treatment instructions: Expect mild to moderate muscle soreness. Patient verbalized understanding of these instructions and education.  Patient Consent Given: Yes Education handout provided: Yes Muscles treated: R quad Treatment response/outcome: Twitch Response Elicited and Palpable Increase in Muscle Length   02/01/22 Pt seen for aquatic therapy today.  Treatment took place in water 3.25-4.0 ft [facility] Temp of water was 91.  Pt entered/exited the pool independently via stairs with bilat rail.  Forward/ backward walking, multiple laps High knee marching forward/ backward  Side stepping with arm abdct/add 3 laps Lateral step up/ down (  straddling step) leading with R/ L Step down with LLE, retro up RLE (some pain in R medial knee)  Squats x 10 Hip Abdct/ add x 20 with increased speed Hip ext x 12 Standing hamstring stretch with foot on 2nd step (with overpressure into ext on R) Standing quad stretch with foot on 2nd step behind her, holding rails  Pt requires the buoyancy and hydrostatic pressure of water for support, and to offload joints by unweighting joint load by at least 50 % in navel deep water and by at least 75-80% in chest to neck deep water.  Viscosity of the water is needed for resistance of strengthening. Water current perturbations provides challenge to standing balance requiring increased core activation.   PATIENT EDUCATION:  Education details: KT taping method  Person educated: Patient Education method: Customer service manager Education comprehension: verbalized understanding   HOME EXERCISE PROGRAM: Access Code: [email or web address]  ASSESSMENT:  CLINICAL IMPRESSION: Heather Pierce has made good  progress and reports only intermittent R knee pain, primarily with stairs.  Today she was able to ascend/descend 2 flights of stairs with HR and reciprocal step and minimal discomfort.  She was also able to ambulate over uneven, grassy surfaces  and step up and down curb without difficulty.  She feels she has the tools to continue progress on own, and will ask to return to PT if pain worsens in the future.  Demonstrated another taping method to see if improves patellar tracking and popping.  She  has met all goals and is ready and agreeable to discharge today.    OBJECTIVE IMPAIRMENTS Abnormal gait, decreased activity tolerance, decreased endurance, decreased mobility, difficulty walking, decreased ROM, decreased strength, increased edema, increased fascial restrictions, increased muscle spasms, and pain.   ACTIVITY LIMITATIONS lifting, bending, sitting, standing, squatting, sleeping, stairs, and transfers  PARTICIPATION LIMITATIONS: cleaning, laundry, shopping, community activity, yard work, and volunteering at [employer].   PERSONAL FACTORS Age, Time since onset of injury/illness/exacerbation, and 1-2 comorbidities: R meniscus tear (previous surgery) and obesity  are also affecting patient's functional outcome.   REHAB POTENTIAL: Good  CLINICAL DECISION MAKING: Stable/uncomplicated  EVALUATION COMPLEXITY: Low   GOALS: Goals reviewed with patient? Yes  SHORT TERM GOALS: Target date: 01/04/2022  Patient will be independent with initial HEP. Baseline: given Goal status: MET  LONG TERM GOALS: Target date: 02/15/2022   Patient will be independent with advanced/ongoing HEP to improve outcomes and carryover.  Baseline: needs advancement as tolerated Goal status: MET  2.  Patient will report at least 75% improvement in R knee pain to improve QOL. Baseline: 5-10/10 R knee pain Goal status: MET 01/17/22- 2-3/10  02/20/22- 75% improvement.   3.  Patient will demonstrate improved R knee  AROM to >/= 2-120 deg to allow for normal gait and stair mechanics. Baseline: 10-115 Goal status: MET 2-120  4.  Patient will demonstrate improved functional LE strength as demonstrated by full R knee extension without quad lag. Baseline: 15 deg quad lag Goal status: MET no quad lag, full extension.  5.  Patient will be able to ambulate 1200' with LRAD and normal gait pattern without increased pain to access community.  Baseline: antalgic, slow, RLE in ER Goal status: MET able to walk 1/2 mile 2x/day now without increased L knee pain    6. Patient will be able to ascend/descend stairs with 1 HR and reciprocal step pattern safely to access home and community.  Baseline: step to gait, avoids stairs or going up stairs due  to pain Goal status: MET 01/17/22 mild pain descending stairs, more crepitus. 02/20/22- able to ascend/descend with reciprocal step, completed 2 flights with only mild knee pain.   7.  Patient will report <40% impairment on LEFS to demonstrate improved functional ability. Baseline: 33/80 indicates 59% disability  Goal status: MET  63/80=  78.8% ability   8.  Patient will be able to ambulate over grassy surfaces without increased R knee pain to volunteer at golf tournaments.  Baseline: unable due to R knee pain Goal status: MET  02/20/22- no difficulty ambulating over grass and up and down curbs around [facility].   PLAN: PT FREQUENCY: 1-2x/week  PT DURATION: 8 weeks  PLANNED INTERVENTIONS: Therapeutic exercises, Therapeutic activity, Neuromuscular re-education, Balance training, Gait training, Patient/Family education, Joint mobilization, Stair training, Aquatic Therapy, Dry Needling, Electrical stimulation, Cryotherapy, Moist heat, Taping, Vasopneumatic device, Ultrasound, Ionotophoresis 30m/ml Dexamethasone, and Manual therapy  PLAN FOR NEXT SESSION:  [facility], [facility]  02/20/22 5:27 PM

## 2022-03-01 ENCOUNTER — Ambulatory Visit: Payer: Self-pay

## 2022-03-01 ENCOUNTER — Encounter: Payer: Self-pay | Admitting: Family Medicine

## 2022-03-01 ENCOUNTER — Ambulatory Visit: Payer: Medicare Other | Admitting: Family Medicine

## 2022-03-01 VITALS — BP 152/84 | Ht 64.0 in | Wt 206.0 lb

## 2022-03-01 DIAGNOSIS — M1711 Unilateral primary osteoarthritis, right knee: Secondary | ICD-10-CM

## 2022-03-01 MED ORDER — TRIAMCINOLONE ACETONIDE 40 MG/ML IJ SUSP
40.0000 mg | Freq: Once | INTRAMUSCULAR | Status: AC
  Administered 2022-03-01: 40 mg via INTRA_ARTICULAR

## 2022-03-01 NOTE — Progress Notes (Signed)
  Heather Pierce - 71 y.o. female MRN [id number]  Date of birth: May 19, 1951  SUBJECTIVE:  Including CC & ROS.  No chief complaint on file.   Heather Pierce is a 71 y.o. female that is presenting with acute on chronic right knee pain.  The pain is started getting worse over the past few days.  She is having limited range of motion.    Review of Systems See HPI   HISTORY: Past Medical, Surgical, Social, and Family History Reviewed & Updated per EMR.   Pertinent Historical Findings include:  Past Medical History:  Diagnosis Date   Allergy    GERD (gastroesophageal reflux disease)    Hyperlipidemia    Hypertension     Past Surgical History:  Procedure Laterality Date   KNEE ARTHROSCOPY Right 11/29/2009     PHYSICAL EXAM:  VS: BP (!) 152/84 (BP Location: Left Arm, Patient Position: Sitting)   Ht '5\' 4"'$  (1.626 m)   Wt 206 lb (93.4 kg)   LMP 09/24/2005   BMI 35.36 kg/m  Physical Exam Gen: NAD, alert, cooperative with exam, well-appearing MSK:  Neurovascularly intact     Aspiration/Injection Procedure Note Keiry Kowal 1951/05/08  Procedure: Aspiration and Injection Indications: Right knee pain  Procedure Details Consent: Risks of procedure as well as the alternatives and risks of each were explained to the (patient/caregiver).  Consent for procedure obtained. Time Out: Verified patient identification, verified procedure, site/side was marked, verified correct patient position, special equipment/implants available, medications/allergies/relevent history reviewed, required imaging and test results available.  Performed.  The area was cleaned with iodine and alcohol swabs.    The right knee superior lateral suprapatellar pouch was injected using 3 cc's of 1% lidocaine on a 21 gauge 1 1/2" needle. An 18 gauge 1 1/2" needle was used for aspiration. The syringe was switched and a mixture of 1 cc's of 40 mg Kenalog and 4 cc's of 0.25% bupivacaine was injected.  Ultrasound was  used. Images were obtained in long views showing the injection.    Amount of Fluid Aspirated:  162m Character of Fluid: clear and straw colored Fluid was sent for: n/a  A sterile dressing was applied.  Patient did tolerate procedure well.     ASSESSMENT & PLAN:   Primary osteoarthritis of right knee Acute on chronic in nature.  Her symptoms have started returning.  Having significant swelling on exam that is limiting her range of motion. -Counseled on home exercise therapy and supportive care. -Aspiration and injection. -Pursue Zilretta going forward.

## 2022-03-01 NOTE — Patient Instructions (Signed)
Good to see you Please use ice as needed  Please send me a message in MyChart with any questions or updates.  Please see me back in November.   --Dr. Raeford Razor

## 2022-03-01 NOTE — Assessment & Plan Note (Signed)
Acute on chronic in nature.  Her symptoms have started returning.  Having significant swelling on exam that is limiting her range of motion. -Counseled on home exercise therapy and supportive care. -Aspiration and injection. -Pursue Zilretta going forward.

## 2022-03-14 DIAGNOSIS — D1801 Hemangioma of skin and subcutaneous tissue: Secondary | ICD-10-CM | POA: Diagnosis not present

## 2022-03-14 DIAGNOSIS — X32XXXS Exposure to sunlight, sequela: Secondary | ICD-10-CM | POA: Diagnosis not present

## 2022-03-14 DIAGNOSIS — L821 Other seborrheic keratosis: Secondary | ICD-10-CM | POA: Diagnosis not present

## 2022-03-14 DIAGNOSIS — Z85820 Personal history of malignant melanoma of skin: Secondary | ICD-10-CM | POA: Diagnosis not present

## 2022-03-14 DIAGNOSIS — L570 Actinic keratosis: Secondary | ICD-10-CM | POA: Diagnosis not present

## 2022-03-14 DIAGNOSIS — L814 Other melanin hyperpigmentation: Secondary | ICD-10-CM | POA: Diagnosis not present

## 2022-03-18 ENCOUNTER — Ambulatory Visit (INDEPENDENT_AMBULATORY_CARE_PROVIDER_SITE_OTHER): Payer: Medicare Other

## 2022-03-18 VITALS — Ht 64.0 in | Wt 204.0 lb

## 2022-03-18 DIAGNOSIS — Z0000 Encounter for general adult medical examination without abnormal findings: Secondary | ICD-10-CM | POA: Diagnosis not present

## 2022-03-18 NOTE — Progress Notes (Signed)
Subjective:   Heather Pierce is a 71 y.o. female who presents for Medicare Annual (Subsequent) preventive examination.  I connected with Winni today by telephone and verified that I am speaking with the correct person using two identifiers. Location patient: home Location provider: work Persons participating in the virtual visit: patient, Marine scientist.    I discussed the limitations, risks, security and privacy concerns of performing an evaluation and management service by telephone and the availability of in person appointments. I also discussed with the patient that there may be a patient responsible charge related to this service. The patient expressed understanding and verbally consented to this telephonic visit.    Interactive audio and video telecommunications were attempted between this provider and patient, however failed, due to patient having technical difficulties OR patient did not have access to video capability.  We continued and completed visit with audio only.  Some vital signs may be absent or patient reported.   Time Spent with patient on telephone encounter: 30 minutes   Review of Systems     Cardiac Risk Factors include: advanced age (>50mn, >>70women);obesity (BMI >30kg/m2);dyslipidemia;hypertension     Objective:    Today's Vitals   03/18/22 1001 03/18/22 1002  Weight: 204 lb (92.5 kg)   Height: '5\' 4"'$  (1.626 m)   PainSc:  2    Body mass index is 35.02 kg/m.     03/18/2022   10:05 AM 12/21/2021    8:52 AM 06/01/2020    9:45 AM 05/17/2019    1:17 PM 05/08/2018    2:06 PM 06/26/2017    4:10 PM 05/03/2016    9:38 AM  Advanced Directives  Does Patient Have a Medical Advance Directive? Yes Yes Yes Yes Yes Yes No  Type of AParamedicof [place]umboldtLiving will Healthcare Power of AAlto Bonito HeightsLiving will HJAARSLiving will [place]Living will [place]St. [place]   Does patient want to make changes to medical advance directive?  No - Patient declined  No - Patient declined No - Patient declined No - Patient declined   Copy of HRavalliin Chart? No - copy requested Yes - validated most recent copy scanned in chart (See row information) Yes - validated most recent copy scanned in chart (See row information) No - copy requested Yes - validated most recent copy scanned in chart (See row information)    Would patient like information on creating a medical advance directive?       No - patient declined information;Yes - Educational materials given    Current Medications (verified) Outpatient Encounter Medications as of 03/18/2022  Medication Sig   ibuprofen (ADVIL) 200 MG tablet Take 200 mg by mouth every 6 (six) hours as needed.   lisinopril-hydrochlorothiazide (ZESTORETIC) 20-25 MG tablet TAKE 1 TABLET BY MOUTH EVERY DAY   simvastatin (ZOCOR) 40 MG tablet TAKE 1 TABLET BY MOUTH EVERY DAY   No facility-administered encounter medications on file as of 03/18/2022.    Allergies (verified) Patient has no known allergies.   History: Past Medical History:  Diagnosis Date   Allergy    GERD (gastroesophageal reflux disease)    Hyperlipidemia    Hypertension    Past Surgical History:  Procedure Laterality Date   KNEE ARTHROSCOPY Right 11/29/2009   Family History  Problem Relation Age of Onset   Jaundice Father    GER disease Father    COPD Father    Dementia Father  Osteoarthritis Father        In nursing home   Cancer Father        melanoma   Alzheimer's disease Father    Colon polyps Father    Alzheimer's disease Mother    Dementia Mother    Arthritis Other    Hyperlipidemia Other    Hypertension Other    Coronary artery disease Other        1st degree relative '@60s'$    Melanoma Other    Colon cancer Maternal Grandmother 89   Social History   Socioeconomic History   Marital status: Single    Spouse name: Not on  file   Number of children: Not on file   Years of education: Not on file   Highest education level: Not on file  Occupational History   Occupation: retired    Comment: [employer]    Employer: [employer]  Tobacco Use   Smoking status: Never   Smokeless tobacco: Never  Substance and Sexual Activity   Alcohol use: Yes    Comment: occ. maybe 2 drinks per month per pt.   Drug use: No   Sexual activity: Never    Partners: Male    Birth control/protection: None  Other Topics Concern   Not on file  Social History Narrative   Exercise-- walking dog   Social Determinants of Health   Financial Resource Strain: Low Risk  (03/18/2022)   Overall Financial Resource Strain (CARDIA)    Difficulty of Paying Living Expenses: Not hard at all  Food Insecurity: No Food Insecurity (03/18/2022)   Hunger Vital Sign    Worried About Running Out of Food in the Last Year: Never true    Ran Out of Food in the Last Year: Never true  Transportation Needs: No Transportation Needs (03/18/2022)   PRAPARE - Hydrologist (Medical): No    Lack of Transportation (Non-Medical): No  Physical Activity: Sufficiently Active (03/18/2022)   Exercise Vital Sign    Days of Exercise per Week: 7 days    Minutes of Exercise per Session: 30 min  Stress: No Stress Concern Present (03/18/2022)   [place]    Feeling of Stress : Not at all  Social Connections: Socially Isolated (03/18/2022)   Social Connection and Isolation Panel [NHANES]    Frequency of Communication with Friends and Family: Twice a week    Frequency of Social Gatherings with Friends and Family: Twice a week    Attends Religious Services: Never    Marine scientist or Organizations: No    Attends Music therapist: Never    Marital Status: Never married    Tobacco Counseling Counseling given: Not Answered   Clinical Intake:  Pre-visit  preparation completed: Yes  Pain : 0-10 Pain Score: 2  Pain Type: Chronic pain Pain Location: Knee Pain Orientation: Right Pain Onset: More than a month ago Pain Frequency: Constant     BMI - recorded: 35.02 Nutritional Status: BMI > 30  Obese Nutritional Risks: None Diabetes: No  How often do you need to have someone help you when you read instructions, pamphlets, or other written materials from your doctor or pharmacy?: 1 - Never  Diabetic?No  Interpreter Needed?: No  Information entered by :: Caroleen Hamman LPN   Activities of Daily Living    03/18/2022   10:10 AM 03/11/2022    5:52 PM  In your present state of  health, do you have any difficulty performing the following activities:  Hearing? 0 0  Vision? 0 0  Difficulty concentrating or making decisions? 0 0  Walking or climbing stairs? 0 0  Dressing or bathing? 0 0  Doing errands, shopping? 0 0  Preparing Food and eating ? N N  Using the Toilet? N N  In the past six months, have you accidently leaked urine? N N  Do you have problems with loss of bowel control? N N  Managing your Medications? N N  Managing your Finances? N N  Housekeeping or managing your Housekeeping? N N    Patient Care Team: Carollee Herter, Alferd Apa, DO as PCP - General Suzan Slick, NP as Nurse Practitioner (Orthopedic Surgery)  Indicate any recent Medical Services you may have received from other than [facility] providers in the past year (date may be approximate).     Assessment:   This is a routine wellness examination for [place].  Hearing/Vision screen Hearing Screening - Comments:: No issues Vision Screening - Comments:: Last eye exam-08/2021-Dr. Kathlen Mody  Dietary issues and exercise activities discussed: Current Exercise Habits: Home exercise routine, Type of exercise: walking, Time (Minutes): 30, Frequency (Times/Week): 7, Weekly Exercise (Minutes/Week): 210, Intensity: Mild, Exercise limited by: None identified   Goals Addressed              This Visit's Progress    DIET - EAT MORE FRUITS AND VEGETABLES   Not on track    DIET - [place]   On track    Patient Stated   On track    Increase activity by walking       Depression Screen    03/18/2022   10:10 AM 11/15/2021    8:52 AM 06/01/2020    9:51 AM 05/17/2019    1:28 PM 05/08/2018    2:06 PM 05/03/2016    9:20 AM 11/12/2012    8:21 AM  PHQ 2/9 Scores  PHQ - 2 Score 1 0 0 0 0 0 0    Fall Risk    03/18/2022   10:08 AM 03/11/2022    5:52 PM 11/15/2021    8:52 AM 06/01/2020    9:50 AM 05/17/2019    1:27 PM  Fall Risk   Falls in the past year? 0 0 0 0 0  Number falls in past yr: 0 0 0 0 0  Injury with Fall? 0 0 0 0 0  Follow up Falls prevention discussed  Falls evaluation completed Falls prevention discussed Education provided;Falls prevention discussed    FALL RISK PREVENTION PERTAINING TO THE HOME:  Any stairs in or around the home? Yes  If so, are there any without handrails? No  Home free of loose throw rugs in walkways, pet beds, electrical cords, etc? Yes  Adequate lighting in your home to reduce risk of falls? Yes   ASSISTIVE DEVICES UTILIZED TO PREVENT FALLS:  Life alert? No  Use of a cane, walker or w/c? No  Grab bars in the bathroom? Yes  Shower chair or bench in shower? No  Elevated toilet seat or a handicapped toilet? No   TIMED UP AND GO:  Was the test performed? No . Phone visit   Cognitive Function:Normal cognitive status assessed by this Nurse Health Advisor. No abnormalities found.      05/03/2016    9:44 AM  MMSE - Mini Mental State Exam  Orientation to time 5  Orientation to Place 5  Registration 3  Attention/  Calculation 5  Recall 3  Language- name 2 objects 2  Language- repeat 1  Language- follow 3 step command 3  Language- read & follow direction 1  Write a sentence 1  Copy design 1  Total score 30        03/18/2022   10:18 AM  6CIT Screen  What Year? 0 points  What month? 0 points  What  time? 0 points  Count back from 20 0 points  Months in reverse 0 points  Repeat phrase 0 points  Total Score 0 points    Immunizations Immunization History  Administered Date(s) Administered   Fluad Quad(high Dose 65+) 03/05/2019, 03/28/2020, 04/24/2021   Influenza, High Dose Seasonal PF 04/15/2017, 05/08/2018   Influenza,inj,Quad PF,6+ Mos 05/04/2013, 03/25/2016   Influenza-Unspecified 06/01/2011, 03/22/2014, 03/21/2015   PFIZER Comirnaty(Gray Top)Covid-19 Tri-Sucrose Vaccine 01/25/2021   PFIZER(Purple Top)SARS-COV-2 Vaccination 08/08/2019, 09/02/2019, 04/03/2020   Pfizer Covid-19 Vaccine Bivalent Booster 72yr & up 05/17/2021   Pneumococcal Conjugate-13 05/03/2016   Pneumococcal Polysaccharide-23 05/20/2017   Td 04/18/2009   Zoster, Live 11/12/2012    TDAP status: Due, Education has been provided regarding the importance of this vaccine. Advised may receive this vaccine at local pharmacy or Health Dept. Aware to provide a copy of the vaccination record if obtained from local pharmacy or Health Dept. Verbalized acceptance and understanding.  Flu Vaccine status: Due, Education has been provided regarding the importance of this vaccine. Advised may receive this vaccine at local pharmacy or Health Dept. Aware to provide a copy of the vaccination record if obtained from local pharmacy or Health Dept. Verbalized acceptance and understanding.  Pneumococcal vaccine status: Up to date  Covid-19 vaccine status: Information provided on how to obtain vaccines.   Qualifies for Shingles Vaccine? Yes   Zostavax completed Yes   Shingrix Completed?: No.    Education has been provided regarding the importance of this vaccine. Patient has been advised to call insurance company to determine out of pocket expense if they have not yet received this vaccine. Advised may also receive vaccine at local pharmacy or Health Dept. Verbalized acceptance and understanding.  Screening Tests Health Maintenance   Topic Date Due   Zoster Vaccines- Shingrix (1 of 2) Never done   TETANUS/TDAP  04/19/2019   COVID-19 Vaccine (6 - Pfizer risk series) 07/12/2021   INFLUENZA VACCINE  01/29/2022   MAMMOGRAM  09/12/2022   COLONOSCOPY (Pts 45-454yrInsurance coverage will need to be confirmed)  01/16/2023   DEXA SCAN  09/12/2023   Pneumonia Vaccine 6531Years old  Completed   Hepatitis C Screening  Completed   HPV VACCINES  Aged Out    Health Maintenance  Health Maintenance Due  Topic Date Due   Zoster Vaccines- Shingrix (1 of 2) Never done   TETANUS/TDAP  04/19/2019   COVID-19 Vaccine (6 - Pfizer risk series) 07/12/2021   INFLUENZA VACCINE  01/29/2022    Colorectal cancer screening: Type of screening: Colonoscopy. Completed 01/15/2013. Repeat every 10 years  Mammogram status: Completed bilateral 09/11/2021. Repeat every year  Bone Density status: Completed 09/11/2021. Results reflect: Bone density results: NORMAL. Repeat every 2 years.  Lung Cancer Screening: (Low Dose CT Chest recommended if Age 71-80ears, 30 pack-year currently smoking OR have quit w/in 15years.) does not qualify.     Additional Screening:  Hepatitis C Screening: Completed 04/03/2015  Vision Screening: Recommended annual ophthalmology exams for early detection of glaucoma and other disorders of the eye. Is the patient up to date with their  annual eye exam?  Yes  Who is the provider or what is the name of the office in which the patient attends annual eye exams? Dr. Kathlen Mody   Dental Screening: Recommended annual dental exams for proper oral hygiene  Community Resource Referral / Chronic Care Management: CRR required this visit?  No   CCM required this visit?  No      Plan:     I have personally reviewed and noted the following in the patient's chart:   Medical and social history Use of alcohol, tobacco or illicit drugs  Current medications and supplements including opioid prescriptions. Patient is not currently  taking opioid prescriptions. Functional ability and status Nutritional status Physical activity Advanced directives List of other physicians Hospitalizations, surgeries, and ER visits in previous 12 months Vitals Screenings to include cognitive, depression, and falls Referrals and appointments  In addition, I have reviewed and discussed with patient certain preventive protocols, quality metrics, and best practice recommendations. A written personalized care plan for preventive services as well as general preventive health recommendations were provided to patient.   Due to this being a telephonic visit, the after visit summary with patients personalized plan was offered to patient via mail or my-chart. Patient would like to access on my-chart.  Marta Antu, LPN   7/48/2707  Nurse Health Advisor  Nurse Notes: None

## 2022-03-18 NOTE — Patient Instructions (Signed)
Heather Pierce , Thank you for taking time to complete your Medicare Wellness Visit. I appreciate your ongoing commitment to your health goals. Please review the following plan we discussed and let me know if I can assist you in the future.   Screening recommendations/referrals: Colonoscopy: Completed 01/15/2013-Due 01/16/2023 Mammogram: Completed 09/11/2021-Due 09/12/2022 Bone Density: Completed 09/11/2021-Due 09/12/2023 Recommended yearly ophthalmology/optometry visit for glaucoma screening and checkup Recommended yearly dental visit for hygiene and checkup  Vaccinations: Influenza vaccine: Due-May obtain vaccine at your local pharmacy. Pneumococcal vaccine: Up to date Tdap vaccine: Due-May obtain vaccine at your local pharmacy. Shingles vaccine: Due-May obtain vaccine at your local pharmacy.   Covid-19:Booster available at your local pharmacy  Advanced directives: Please bring a copy of Living Will and/or Healthcare Power of Attorney for your chart.   Conditions/risks identified: See problem list  Next appointment: Follow up in one year for your annual wellness visit    Preventive Care 65 Years and Older, Female Preventive care refers to lifestyle choices and visits with your health care provider that can promote health and wellness. What does preventive care include? A yearly physical exam. This is also called an annual well check. Dental exams once or twice a year. Routine eye exams. Ask your health care provider how often you should have your eyes checked. Personal lifestyle choices, including: Daily care of your teeth and gums. Regular physical activity. Eating a healthy diet. Avoiding tobacco and drug use. Limiting alcohol use. Practicing safe sex. Taking low-dose aspirin every day. Taking vitamin and mineral supplements as recommended by your health care provider. What happens during an annual well check? The services and screenings done by your health care provider during  your annual well check will depend on your age, overall health, lifestyle risk factors, and family history of disease. Counseling  Your health care provider may ask you questions about your: Alcohol use. Tobacco use. Drug use. Emotional well-being. Home and relationship well-being. Sexual activity. Eating habits. History of falls. Memory and ability to understand (cognition). Work and work Statistician. Reproductive health. Screening  You may have the following tests or measurements: Height, weight, and BMI. Blood pressure. Lipid and cholesterol levels. These may be checked every 5 years, or more frequently if you are over 75 years old. Skin check. Lung cancer screening. You may have this screening every year starting at age 59 if you have a 30-pack-year history of smoking and currently smoke or have quit within the past 15 years. Fecal occult blood test (FOBT) of the stool. You may have this test every year starting at age 39. Flexible sigmoidoscopy or colonoscopy. You may have a sigmoidoscopy every 5 years or a colonoscopy every 10 years starting at age 78. Hepatitis C blood test. Hepatitis B blood test. Sexually transmitted disease (STD) testing. Diabetes screening. This is done by checking your blood sugar (glucose) after you have not eaten for a while (fasting). You may have this done every 1-3 years. Bone density scan. This is done to screen for osteoporosis. You may have this done starting at age 83. Mammogram. This may be done every 1-2 years. Talk to your health care provider about how often you should have regular mammograms. Talk with your health care provider about your test results, treatment options, and if necessary, the need for more tests. Vaccines  Your health care provider may recommend certain vaccines, such as: Influenza vaccine. This is recommended every year. Tetanus, diphtheria, and acellular pertussis (Tdap, Td) vaccine. You may need a Td booster  every 10  years. Zoster vaccine. You may need this after age 26. Pneumococcal 13-valent conjugate (PCV13) vaccine. One dose is recommended after age 34. Pneumococcal polysaccharide (PPSV23) vaccine. One dose is recommended after age 21. Talk to your health care provider about which screenings and vaccines you need and how often you need them. This information is not intended to replace advice given to you by your health care provider. Make sure you discuss any questions you have with your health care provider. Document Released: 07/14/2015 Document Revised: 03/06/2016 Document Reviewed: 04/18/2015 Elsevier Interactive Patient Education  2017 [place] Prevention in the Home Falls can cause injuries. They can happen to people of all ages. There are many things you can do to make your home safe and to help prevent falls. What can I do on the outside of my home? Regularly fix the edges of walkways and driveways and fix any cracks. Remove anything that might make you trip as you walk through a door, such as a raised step or threshold. Trim any bushes or trees on the path to your home. Use bright outdoor lighting. Clear any walking paths of anything that might make someone trip, such as rocks or tools. Regularly check to see if handrails are loose or broken. Make sure that both sides of any steps have handrails. Any raised decks and porches should have guardrails on the edges. Have any leaves, snow, or ice cleared regularly. Use sand or salt on walking paths during winter. Clean up any spills in your garage right away. This includes oil or grease spills. What can I do in the bathroom? Use night lights. Install grab bars by the toilet and in the tub and shower. Do not use towel bars as grab bars. Use non-skid mats or decals in the tub or shower. If you need to sit down in the shower, use a plastic, non-slip stool. Keep the floor dry. Clean up any water that spills on the floor as soon as it  happens. Remove soap buildup in the tub or shower regularly. Attach bath mats securely with double-sided non-slip rug tape. Do not have throw rugs and other things on the floor that can make you trip. What can I do in the bedroom? Use night lights. Make sure that you have a light by your bed that is easy to reach. Do not use any sheets or blankets that are too big for your bed. They should not hang down onto the floor. Have a firm chair that has side arms. You can use this for support while you get dressed. Do not have throw rugs and other things on the floor that can make you trip. What can I do in the kitchen? Clean up any spills right away. Avoid walking on wet floors. Keep items that you use a lot in easy-to-reach places. If you need to reach something above you, use a strong step stool that has a grab bar. Keep electrical cords out of the way. Do not use floor polish or wax that makes floors slippery. If you must use wax, use non-skid floor wax. Do not have throw rugs and other things on the floor that can make you trip. What can I do with my stairs? Do not leave any items on the stairs. Make sure that there are handrails on both sides of the stairs and use them. Fix handrails that are broken or loose. Make sure that handrails are as long as the stairways. Check any carpeting to  make sure that it is firmly attached to the stairs. Fix any carpet that is loose or worn. Avoid having throw rugs at the top or bottom of the stairs. If you do have throw rugs, attach them to the floor with carpet tape. Make sure that you have a light switch at the top of the stairs and the bottom of the stairs. If you do not have them, ask someone to add them for you. What else can I do to help prevent falls? Wear shoes that: Do not have high heels. Have rubber bottoms. Are comfortable and fit you well. Are closed at the toe. Do not wear sandals. If you use a stepladder: Make sure that it is fully opened.  Do not climb a closed stepladder. Make sure that both sides of the stepladder are locked into place. Ask someone to hold it for you, if possible. Clearly mark and make sure that you can see: Any grab bars or handrails. First and last steps. Where the edge of each step is. Use tools that help you move around (mobility aids) if they are needed. These include: Canes. Walkers. Scooters. Crutches. Turn on the lights when you go into a dark area. Replace any light bulbs as soon as they burn out. Set up your furniture so you have a clear path. Avoid moving your furniture around. If any of your floors are uneven, fix them. If there are any pets around you, be aware of where they are. Review your medicines with your doctor. Some medicines can make you feel dizzy. This can increase your chance of falling. Ask your doctor what other things that you can do to help prevent falls. This information is not intended to replace advice given to you by your health care provider. Make sure you discuss any questions you have with your health care provider. Document Released: 04/13/2009 Document Revised: 11/23/2015 Document Reviewed: 07/22/2014 Elsevier Interactive Patient Education  2017 [employer].

## 2022-04-10 ENCOUNTER — Ambulatory Visit: Payer: Medicare Other | Admitting: Family Medicine

## 2022-04-10 ENCOUNTER — Ambulatory Visit: Payer: Self-pay

## 2022-04-10 ENCOUNTER — Encounter: Payer: Self-pay | Admitting: Family Medicine

## 2022-04-10 VITALS — BP 118/70 | Ht 64.0 in | Wt 203.0 lb

## 2022-04-10 DIAGNOSIS — M1711 Unilateral primary osteoarthritis, right knee: Secondary | ICD-10-CM | POA: Diagnosis not present

## 2022-04-10 NOTE — Progress Notes (Signed)
  Heather Pierce - 71 y.o. female MRN [id number]  Date of birth: 05/06/1951  SUBJECTIVE:  Including CC & ROS.  No chief complaint on file.   Heather Pierce is a 71 y.o. female that is  here for acute right knee swelling. No injury. Has been doing well since her last injection.    Review of Systems See HPI   HISTORY: Past Medical, Surgical, Social, and Family History Reviewed & Updated per EMR.   Pertinent Historical Findings include:  Past Medical History:  Diagnosis Date   Allergy    GERD (gastroesophageal reflux disease)    Hyperlipidemia    Hypertension     Past Surgical History:  Procedure Laterality Date   KNEE ARTHROSCOPY Right 11/29/2009     PHYSICAL EXAM:  VS: BP 118/70   Ht '5\' 4"'$  (1.626 m)   Wt 203 lb (92.1 kg)   LMP 09/24/2005   BMI 34.84 kg/m  Physical Exam Gen: NAD, alert, cooperative with exam, well-appearing MSK:  Neurovascularly intact     Aspiration/Injection Procedure Note Heather Pierce Dec 29, 1950  Procedure: Aspiration Indications: right knee pain  Procedure Details Consent: Risks of procedure as well as the alternatives and risks of each were explained to the (patient/caregiver).  Consent for procedure obtained. Time Out: Verified patient identification, verified procedure, site/side was marked, verified correct patient position, special equipment/implants available, medications/allergies/relevent history reviewed, required imaging and test results available.  Performed.  The area was cleaned with iodine and alcohol swabs.    The right knee superior lateral suprapatellar pouch was injected using 3 cc's of 1% lidocaine with a 22 1 1/2" needle.  An 18-gauge 1-1/2 inch needle was used to achieve aspiration.  Ultrasound was used. Images were obtained in long views showing the injection.    Amount of Fluid Aspirated:  159m Character of Fluid: red colored Fluid was sent for: n/a  A sterile dressing was applied.  Patient did tolerate procedure  well.     ASSESSMENT & PLAN:   Primary osteoarthritis of right knee Acutely worsening of her effusion. -Counseled on home exercise therapy and supportive care. -Aspiration today. -pursue Zilretta.

## 2022-04-10 NOTE — Patient Instructions (Signed)
Good to see you Please use ice as needed  Please send me a message in MyChart with any questions or updates.  Please see me back to have the zilretta injection.   --Dr. Raeford Razor

## 2022-04-10 NOTE — Assessment & Plan Note (Signed)
Acutely worsening of her effusion. -Counseled on home exercise therapy and supportive care. -Aspiration today. -pursue Zilretta.

## 2022-04-11 ENCOUNTER — Telehealth: Payer: Self-pay | Admitting: Family Medicine

## 2022-04-11 NOTE — Telephone Encounter (Signed)
Submitted request for right knee zilretta today. Uploaded last office note to portal.

## 2022-04-12 ENCOUNTER — Encounter: Payer: Self-pay | Admitting: Family Medicine

## 2022-04-15 ENCOUNTER — Other Ambulatory Visit: Payer: Self-pay | Admitting: Family Medicine

## 2022-04-15 DIAGNOSIS — I10 Essential (primary) hypertension: Secondary | ICD-10-CM

## 2022-04-16 NOTE — Telephone Encounter (Signed)
Pt informed of below.  OV scheduled 05/09/22.  Rec'd summary of benefits from [employer]. Patient's plan covers Zilretta at 80%. Patient owes 20% of the cost of Zilretta ( approx $250) and a $30 copay.

## 2022-04-29 ENCOUNTER — Encounter: Payer: Self-pay | Admitting: Family Medicine

## 2022-04-29 ENCOUNTER — Ambulatory Visit (INDEPENDENT_AMBULATORY_CARE_PROVIDER_SITE_OTHER): Payer: Medicare Other | Admitting: Family Medicine

## 2022-04-29 VITALS — BP 120/70 | HR 87 | Temp 98.0°F | Resp 18 | Ht 64.0 in | Wt 204.4 lb

## 2022-04-29 DIAGNOSIS — E785 Hyperlipidemia, unspecified: Secondary | ICD-10-CM

## 2022-04-29 DIAGNOSIS — Z23 Encounter for immunization: Secondary | ICD-10-CM

## 2022-04-29 DIAGNOSIS — I10 Essential (primary) hypertension: Secondary | ICD-10-CM | POA: Diagnosis not present

## 2022-04-29 DIAGNOSIS — Z0000 Encounter for general adult medical examination without abnormal findings: Secondary | ICD-10-CM

## 2022-04-29 DIAGNOSIS — M1711 Unilateral primary osteoarthritis, right knee: Secondary | ICD-10-CM

## 2022-04-29 LAB — COMPREHENSIVE METABOLIC PANEL
ALT: 14 U/L (ref 0–35)
AST: 15 U/L (ref 0–37)
Albumin: 4.4 g/dL (ref 3.5–5.2)
Alkaline Phosphatase: 70 U/L (ref 39–117)
BUN: 15 mg/dL (ref 6–23)
CO2: 25 mEq/L (ref 19–32)
Calcium: 9.5 mg/dL (ref 8.4–10.5)
Chloride: 100 mEq/L (ref 96–112)
Creatinine, Ser: 0.74 mg/dL (ref 0.40–1.20)
GFR: 81.43 mL/min (ref >60.00)
Glucose, Bld: 100 mg/dL — ABNORMAL HIGH (ref 70–99)
Potassium: 3.9 mEq/L (ref 3.5–5.1)
Sodium: 135 mEq/L (ref 135–145)
Total Bilirubin: 0.5 mg/dL (ref 0.2–1.2)
Total Protein: 6.3 g/dL (ref 6.0–8.3)

## 2022-04-29 LAB — CBC WITH DIFFERENTIAL/PLATELET
Basophils Absolute: 0 10*3/uL (ref 0.0–0.1)
Basophils Relative: 0.5 % (ref 0.0–3.0)
Eosinophils Absolute: 0.1 10*3/uL (ref 0.0–0.7)
Eosinophils Relative: 1.1 % (ref 0.0–5.0)
HCT: 35.9 % — ABNORMAL LOW (ref 36.0–46.0)
Hemoglobin: 12.2 g/dL (ref 12.0–15.0)
Lymphocytes Relative: 28.6 % (ref 12.0–46.0)
Lymphs Abs: 1.7 10*3/uL (ref 0.7–4.0)
MCHC: 34 g/dL (ref 30.0–36.0)
MCV: 87.2 fl (ref 78.0–100.0)
Monocytes Absolute: 0.4 10*3/uL (ref 0.1–1.0)
Monocytes Relative: 6.1 % (ref 3.0–12.0)
Neutro Abs: 3.8 10*3/uL (ref 1.4–7.7)
Neutrophils Relative %: 63.7 % (ref 43.0–77.0)
Platelets: 218 10*3/uL (ref 150.0–400.0)
RBC: 4.12 Mil/uL (ref 3.87–5.11)
RDW: 12.5 % (ref 11.5–15.5)
WBC: 6 10*3/uL (ref 4.0–10.5)

## 2022-04-29 LAB — LIPID PANEL
Cholesterol: 138 mg/dL (ref 0–200)
HDL: 52.4 mg/dL (ref >39.00)
LDL Cholesterol: 56 mg/dL (ref 0–99)
NonHDL: 85.58
Total CHOL/HDL Ratio: 3
Triglycerides: 149 mg/dL (ref 0.0–149.0)
VLDL: 29.8 mg/dL (ref 0.0–40.0)

## 2022-04-29 NOTE — Assessment & Plan Note (Signed)
[facility] will do injections  Will probably need to see ortho too

## 2022-04-29 NOTE — Assessment & Plan Note (Signed)
ghm utd Check labs  

## 2022-04-29 NOTE — Assessment & Plan Note (Signed)
Encourage heart healthy diet such as MIND or DASH diet, increase exercise, avoid trans fats, simple carbohydrates and processed foods, consider a krill or fish or flaxseed oil cap daily.  °

## 2022-04-29 NOTE — Assessment & Plan Note (Signed)
Well controlled, no changes to meds. Encouraged heart healthy diet such as the DASH diet and exercise as tolerated.  °

## 2022-04-29 NOTE — Progress Notes (Signed)
Subjective:   By signing my name below, I, Heather Pierce, attest that this documentation has been prepared under the direction and in the presence of Ann Held DO 04/29/2022   Patient ID: Heather Pierce, female    DOB: [birth year], 71 y.o.   MRN: [id number]  Chief Complaint  Patient presents with   Annual Exam    Pt states fasting     HPI Patient is in today for a comprehensive physical exam  She reports that the osteoarthritis in her right knee is persistent. She is regularly following up with  Dr. Raeford Razor. She has received Zilretta injections on 03/01/2022 to manage symptoms. She notes that due to swelling of the area, it had to be drained and blood was found within. She also notes that reports found that she had a meniscus in her right knee. She has been icing the area at home, taking Tylenol Arthritis,  and using kinesiology tape. She is scheduled for an appointment with Dr. Raeford Razor on 05/09/2022. She was informed that she would most likely have to undergo a right knee replacement. She would have preferred to have the area repaired rather than replacement. She notes that her left knee is beginning to cause her pain as well. She reports that she does have a power of attorney.   She is supposed to go to [place] on 05/15/2022 and was inquiring about whether she should take the Covid and influenza vaccine together or separate.   She is regularly following up with her dermatologist at [facility]. She reports that she requested for the documents to be sent to this location. She states that the area on her lower shin is improving.  Her blood pressure during today's visit is normal.  BP Readings from Last 3 Encounters:  04/29/22 120/70  04/10/22 118/70  03/01/22 (!) 152/84   Pulse Readings from Last 3 Encounters:  04/29/22 87  11/20/21 80  11/15/21 84   She denies having any fever, new muscle pain, joint pain , new moles, congestion, sinus pain, sore throat,  chest pain, palpations, cough, SOB ,wheezing,n/v/d constipation, blood in stool, dysuria, frequency, hematuria, at this time  She denies of any changes to her family medical history.  Colonoscopy was last completed on 01/15/2013 Dexa was last completed on 09/11/2021 Mammogram was last completed on 09/11/2021 She is interested in receiving an influenza vaccine during today's visit  Past Medical History:  Diagnosis Date   Allergy    GERD (gastroesophageal reflux disease)    Hyperlipidemia    Hypertension     Past Surgical History:  Procedure Laterality Date   KNEE ARTHROSCOPY Right 11/29/2009    Family History  Problem Relation Age of Onset   Jaundice Father    GER disease Father    COPD Father    Dementia Father    Osteoarthritis Father        In nursing home   Cancer Father        melanoma   Alzheimer's disease Father    Colon polyps Father    Alzheimer's disease Mother    Dementia Mother    Arthritis Other    Hyperlipidemia Other    Hypertension Other    Coronary artery disease Other        1st degree relative '@60s'$    Melanoma Other    Colon cancer Maternal Grandmother 60    Social History   Socioeconomic History   Marital status: Single    Spouse name: Not on  file   Number of children: Not on file   Years of education: Not on file   Highest education level: Not on file  Occupational History   Occupation: retired    Comment: [employer]    Employer: [employer]  Tobacco Use   Smoking status: Never   Smokeless tobacco: Never  Substance and Sexual Activity   Alcohol use: Yes    Comment: occ. maybe 2 drinks per month per pt.   Drug use: No   Sexual activity: Never    Partners: Male    Birth control/protection: None  Other Topics Concern   Not on file  Social History Narrative   Exercise-- walking dog   Social Determinants of Health   Financial Resource Strain: Low Risk  (03/18/2022)   Overall Financial Resource Strain (CARDIA)    Difficulty of  Paying Living Expenses: Not hard at all  Food Insecurity: No Food Insecurity (03/18/2022)   Hunger Vital Sign    Worried About Running Out of Food in the Last Year: Never true    Ran Out of Food in the Last Year: Never true  Transportation Needs: No Transportation Needs (03/18/2022)   PRAPARE - Hydrologist (Medical): No    Lack of Transportation (Non-Medical): No  Physical Activity: Sufficiently Active (03/18/2022)   Exercise Vital Sign    Days of Exercise per Week: 7 days    Minutes of Exercise per Session: 30 min  Stress: No Stress Concern Present (03/18/2022)   [place]    Feeling of Stress : Not at all  Social Connections: Socially Isolated (03/18/2022)   Social Connection and Isolation Panel [NHANES]    Frequency of Communication with Friends and Family: Twice a week    Frequency of Social Gatherings with Friends and Family: Twice a week    Attends Religious Services: Never    Marine scientist or Organizations: No    Attends Archivist Meetings: Never    Marital Status: Never married  Intimate Partner Violence: Not At Risk (03/18/2022)   Humiliation, Afraid, Rape, and Kick questionnaire    Fear of Current or Ex-Partner: No    Emotionally Abused: No    Physically Abused: No    Sexually Abused: No    Outpatient Medications Prior to Visit  Medication Sig Dispense Refill   ibuprofen (ADVIL) 200 MG tablet Take 200 mg by mouth every 6 (six) hours as needed.     lisinopril-hydrochlorothiazide (ZESTORETIC) 20-25 MG tablet TAKE 1 TABLET BY MOUTH EVERY DAY 90 tablet 1   simvastatin (ZOCOR) 40 MG tablet TAKE 1 TABLET BY MOUTH EVERY DAY 90 tablet 1   No facility-administered medications prior to visit.    No Known Allergies  Review of Systems  Constitutional:  Negative for fever and malaise/fatigue.  HENT:  Negative for congestion, sinus pain and sore throat.   Eyes:   Negative for blurred vision.  Respiratory:  Negative for cough, shortness of breath and wheezing.   Cardiovascular:  Negative for chest pain, palpitations and leg swelling.  Gastrointestinal:  Negative for abdominal pain, blood in stool, constipation, diarrhea, nausea and vomiting.  Genitourinary:  Negative for dysuria, frequency and hematuria.  Musculoskeletal:  Negative for falls, joint pain and myalgias.  Skin:  Negative for rash.       (-) [place]  Neurological:  Negative for dizziness, loss of consciousness and headaches.  Endo/Heme/Allergies:  Negative for environmental allergies.  Psychiatric/Behavioral:  Negative for depression. The patient is not nervous/anxious.        Objective:    Physical Exam Vitals and nursing note reviewed.  Constitutional:      General: She is not in acute distress.    Appearance: Normal appearance. She is not ill-appearing.  HENT:     Head: Normocephalic and atraumatic.     Right Ear: Tympanic membrane, ear canal and external ear normal.     Left Ear: Tympanic membrane, ear canal and external ear normal.  Eyes:     Extraocular Movements: Extraocular movements intact.     Pupils: Pupils are equal, round, and reactive to light.  Cardiovascular:     Rate and Rhythm: Normal rate and regular rhythm.     Heart sounds: Normal heart sounds. No murmur heard.    No gallop.  Pulmonary:     Effort: Pulmonary effort is normal. No respiratory distress.     Breath sounds: Normal breath sounds. No wheezing or rales.  Abdominal:     General: Bowel sounds are normal. There is no distension.     Palpations: Abdomen is soft.     Tenderness: There is no abdominal tenderness. There is no guarding.  Skin:    General: Skin is warm and dry.  Neurological:     Mental Status: She is alert and oriented to person, place, and time.  Psychiatric:        Judgment: Judgment normal.     BP 120/70 (BP Location: Left Arm, Patient Position: Sitting, Cuff Size: Large)    Pulse 87   Temp 98 F (36.7 C) (Oral)   Resp 18   Ht '5\' 4"'$  (1.626 m)   Wt 204 lb 6.4 oz (92.7 kg)   LMP 09/24/2005   SpO2 98%   BMI 35.09 kg/m  Wt Readings from Last 3 Encounters:  04/29/22 204 lb 6.4 oz (92.7 kg)  04/10/22 203 lb (92.1 kg)  03/18/22 204 lb (92.5 kg)    Diabetic Foot Exam - Simple   No data filed    Lab Results  Component Value Date   WBC 5.4 04/24/2021   HGB 13.2 04/24/2021   HCT 38.5 04/24/2021   PLT 166.0 04/24/2021   GLUCOSE 94 04/24/2021   CHOL 147 04/24/2021   TRIG 123.0 04/24/2021   HDL 56.50 04/24/2021   LDLDIRECT 148.1 04/18/2009   LDLCALC 66 04/24/2021   ALT 19 04/24/2021   AST 19 04/24/2021   NA 137 04/24/2021   K 4.3 04/24/2021   CL 100 04/24/2021   CREATININE 0.90 04/24/2021   BUN 17 04/24/2021   CO2 29 04/24/2021   TSH 1.18 04/03/2015   MICROALBUR 1.2 04/03/2015    Lab Results  Component Value Date   TSH 1.18 04/03/2015   Lab Results  Component Value Date   WBC 5.4 04/24/2021   HGB 13.2 04/24/2021   HCT 38.5 04/24/2021   MCV 87.4 04/24/2021   PLT 166.0 04/24/2021   Lab Results  Component Value Date   NA 137 04/24/2021   K 4.3 04/24/2021   CO2 29 04/24/2021   GLUCOSE 94 04/24/2021   BUN 17 04/24/2021   CREATININE 0.90 04/24/2021   BILITOT 0.7 04/24/2021   ALKPHOS 68 04/24/2021   AST 19 04/24/2021   ALT 19 04/24/2021   PROT 6.5 04/24/2021   ALBUMIN 4.5 04/24/2021   CALCIUM 9.6 04/24/2021   GFR 64.84 04/24/2021   Lab Results  Component Value Date   CHOL 147 04/24/2021  Lab Results  Component Value Date   HDL 56.50 04/24/2021   Lab Results  Component Value Date   LDLCALC 66 04/24/2021   Lab Results  Component Value Date   TRIG 123.0 04/24/2021   Lab Results  Component Value Date   CHOLHDL 3 04/24/2021   No results found for: "HGBA1C"     Assessment & Plan:   Problem List Items Addressed This Visit       Unprioritized   Primary osteoarthritis of right knee    [facility] will do  injections  Will probably need to see ortho too      Preventative health care - Primary    ghm utd Check labs       Hyperlipidemia    Encourage heart healthy diet such as MIND or DASH diet, increase exercise, avoid trans fats, simple carbohydrates and processed foods, consider a krill or fish or flaxseed oil cap daily.       Relevant Orders   CBC with Differential/Platelet   Comprehensive metabolic panel   Lipid panel   Essential hypertension    Well controlled, no changes to meds. Encouraged heart healthy diet such as the DASH diet and exercise as tolerated.       Relevant Orders   CBC with Differential/Platelet   Comprehensive metabolic panel   Lipid panel   Other Visit Diagnoses     Need for influenza vaccination       Relevant Orders   Flu Vaccine QUAD High Dose(Fluad) (Completed)      No orders of the defined types were placed in this encounter.   IAnn Held, DO, personally preformed the services described in this documentation.  All medical record entries made by the scribe were at my direction and in my presence.  I have reviewed the chart and discharge instructions (if applicable) and agree that the record reflects my personal performance and is accurate and complete. 04/29/2022   I,Amber Collins,acting as a scribe for Ann Held, DO.,have documented all relevant documentation on the behalf of Ann Held, DO,as directed by  Ann Held, DO while in the presence of Ann Held, DO.    Ann Held, DO

## 2022-05-09 ENCOUNTER — Ambulatory Visit: Payer: Self-pay

## 2022-05-09 ENCOUNTER — Ambulatory Visit (INDEPENDENT_AMBULATORY_CARE_PROVIDER_SITE_OTHER): Payer: Medicare Other | Admitting: Family Medicine

## 2022-05-09 VITALS — BP 122/78 | Ht 64.0 in | Wt 204.0 lb

## 2022-05-09 DIAGNOSIS — M1711 Unilateral primary osteoarthritis, right knee: Secondary | ICD-10-CM

## 2022-05-09 MED ORDER — TRIAMCINOLONE ACETONIDE 32 MG IX SRER
32.0000 mg | Freq: Once | INTRA_ARTICULAR | Status: AC
  Administered 2022-05-09: 32 mg via INTRA_ARTICULAR

## 2022-05-09 NOTE — Progress Notes (Signed)
  Heather Pierce - 71 y.o. female MRN [id number]  Date of birth: September 28, 1950  SUBJECTIVE:  Including CC & ROS.  No chief complaint on file.   Heather Pierce is a 71 y.o. female that is  here for zilretta injection.   Review of Systems See HPI   HISTORY: Past Medical, Surgical, Social, and Family History Reviewed & Updated per EMR.   Pertinent Historical Findings include:  Past Medical History:  Diagnosis Date   Allergy    GERD (gastroesophageal reflux disease)    Hyperlipidemia    Hypertension     Past Surgical History:  Procedure Laterality Date   KNEE ARTHROSCOPY Right 11/29/2009     PHYSICAL EXAM:  VS: BP 122/78   Ht '5\' 4"'$  (1.626 m)   Wt 204 lb (92.5 kg)   LMP 09/24/2005   BMI 35.02 kg/m  Physical Exam Gen: NAD, alert, cooperative with exam, well-appearing MSK:  Neurovascularly intact     Aspiration/Injection Procedure Note Heather Pierce 07-15-1950  Procedure: Aspiration and Injection Indications: right knee pain  Procedure Details Consent: Risks of procedure as well as the alternatives and risks of each were explained to the (patient/caregiver).  Consent for procedure obtained. Time Out: Verified patient identification, verified procedure, site/side was marked, verified correct patient position, special equipment/implants available, medications/allergies/relevent history reviewed, required imaging and test results available.  Performed.  The area was cleaned with iodine and alcohol swabs.    The right knee superior lateral suprapatellar pouch was injected using 3 cc of 1% lidocaine on a 25-gauge 1-1/2 inch needle.  An 18-gauge 1-1/2 needle was used to achieve aspiration.  The syringe was switched and a mixture containing 5 cc's of 32 mg Zilretta and 4 cc's of 0.25% bupivacaine was injected.  Ultrasound was used. Images were obtained in long views showing the injection.   Amount of Fluid Aspirated:  49m Character of Fluid: clear and straw colored Fluid was  sent for: n/a  A sterile dressing was applied.  Patient did tolerate procedure well.       ASSESSMENT & PLAN:   Primary osteoarthritis of right knee Completed zilretta injection and aspiration

## 2022-05-09 NOTE — Patient Instructions (Signed)
Good to see you Please use ice as needed  Please send me a message in MyChart with any questions or updates.  Please see me back as needed.   --Dr. Aengus Sauceda  

## 2022-05-09 NOTE — Assessment & Plan Note (Signed)
Completed zilretta injection and aspiration

## 2022-05-10 ENCOUNTER — Encounter: Payer: Self-pay | Admitting: Family Medicine

## 2022-05-10 ENCOUNTER — Other Ambulatory Visit: Payer: Self-pay | Admitting: Family Medicine

## 2022-05-10 MED ORDER — HYDROCODONE-ACETAMINOPHEN 5-325 MG PO TABS: 1.0000 | ORAL_TABLET | Freq: Three times a day (TID) | ORAL | 0 refills | Status: DC | PRN

## 2022-05-10 NOTE — Progress Notes (Signed)
Provided norco.   Rosemarie Ax, MD [facility] 05/10/2022, 12:50 PM

## 2022-05-19 ENCOUNTER — Encounter: Payer: Self-pay | Admitting: Family Medicine

## 2022-05-20 ENCOUNTER — Other Ambulatory Visit: Payer: Self-pay | Admitting: Family Medicine

## 2022-05-20 DIAGNOSIS — M1711 Unilateral primary osteoarthritis, right knee: Secondary | ICD-10-CM

## 2022-05-22 DIAGNOSIS — M1990 Unspecified osteoarthritis, unspecified site: Secondary | ICD-10-CM | POA: Diagnosis not present

## 2022-05-30 ENCOUNTER — Other Ambulatory Visit (HOSPITAL_BASED_OUTPATIENT_CLINIC_OR_DEPARTMENT_OTHER): Payer: Self-pay

## 2022-05-30 ENCOUNTER — Encounter: Payer: Self-pay | Admitting: Family Medicine

## 2022-05-30 MED ORDER — AREXVY 120 MCG/0.5ML IM SUSR
0.5000 mL | Freq: Once | INTRAMUSCULAR | 0 refills | Status: AC
  Filled 2022-05-30 (×2): qty 0.5, 1d supply, fill #0

## 2022-06-05 ENCOUNTER — Encounter: Payer: Self-pay | Admitting: Family Medicine

## 2022-06-10 ENCOUNTER — Ambulatory Visit (INDEPENDENT_AMBULATORY_CARE_PROVIDER_SITE_OTHER): Payer: Medicare Other | Admitting: Family Medicine

## 2022-06-10 ENCOUNTER — Encounter: Payer: Self-pay | Admitting: Family Medicine

## 2022-06-10 VITALS — BP 130/70 | HR 94 | Temp 98.1°F | Resp 18 | Ht 64.0 in | Wt 203.0 lb

## 2022-06-10 DIAGNOSIS — Z01818 Encounter for other preprocedural examination: Secondary | ICD-10-CM | POA: Diagnosis not present

## 2022-06-10 NOTE — Assessment & Plan Note (Signed)
Pt cleared for surgery

## 2022-06-10 NOTE — Progress Notes (Signed)
Subjective:   By signing my name below, I, Carylon Perches, attest that this documentation has been prepared under the direction and in the presence of Ann Held DO 06/10/2022   Patient ID: Heather Pierce, female    DOB: March 11, 1951, 71 y.o.   MRN: [id number]  Chief Complaint  Patient presents with   surgical clearance    HPI Patient is in today for an office visit and surgical clearance.   She is requesting forms to fill out for a knee replacement surgery approval. She was seen by Dr.Schmitz for primary osteoarthritis of right knee. She has been regularly receiving Zilretta injections in the L Knee.     Her blood pressure is normal. She notes that she is experiencing pain in both of her knees. She is currently taking Tylenol for alleviation of symptoms  BP Readings from Last 3 Encounters:  06/10/22 130/70  05/09/22 122/78  04/29/22 120/70   Pulse Readings from Last 3 Encounters:  06/10/22 94  04/29/22 87  11/20/21 80   Past Medical History:  Diagnosis Date   Allergy    GERD (gastroesophageal reflux disease)    Hyperlipidemia    Hypertension     Past Surgical History:  Procedure Laterality Date   KNEE ARTHROSCOPY Right 11/29/2009    Family History  Problem Relation Age of Onset   Jaundice Father    GER disease Father    COPD Father    Dementia Father    Osteoarthritis Father        In nursing home   Cancer Father        melanoma   Alzheimer's disease Father    Colon polyps Father    Alzheimer's disease Mother    Dementia Mother    Arthritis Other    Hyperlipidemia Other    Hypertension Other    Coronary artery disease Other        1st degree relative '@60s'$    Melanoma Other    Colon cancer Maternal Grandmother 28    Social History   Socioeconomic History   Marital status: Single    Spouse name: Not on file   Number of children: Not on file   Years of education: Not on file   Highest education level: Not on file  Occupational History    Occupation: retired    Comment: [employer]    Employer: [employer]  Tobacco Use   Smoking status: Never   Smokeless tobacco: Never  Substance and Sexual Activity   Alcohol use: Yes    Comment: occ. maybe 2 drinks per month per pt.   Drug use: No   Sexual activity: Never    Partners: Male    Birth control/protection: None  Other Topics Concern   Not on file  Social History Narrative   Exercise-- walking dog   Social Determinants of Health   Financial Resource Strain: Low Risk  (03/18/2022)   Overall Financial Resource Strain (CARDIA)    Difficulty of Paying Living Expenses: Not hard at all  Food Insecurity: No Food Insecurity (03/18/2022)   Hunger Vital Sign    Worried About Running Out of Food in the Last Year: Never true    Ran Out of Food in the Last Year: Never true  Transportation Needs: No Transportation Needs (03/18/2022)   PRAPARE - Hydrologist (Medical): No    Lack of Transportation (Non-Medical): No  Physical Activity: Sufficiently Active (03/18/2022)   Exercise Vital Sign  Days of Exercise per Week: 7 days    Minutes of Exercise per Session: 30 min  Stress: No Stress Concern Present (03/18/2022)   [place]    Feeling of Stress : Not at all  Social Connections: Socially Isolated (03/18/2022)   Social Connection and Isolation Panel [NHANES]    Frequency of Communication with Friends and Family: Twice a week    Frequency of Social Gatherings with Friends and Family: Twice a week    Attends Religious Services: Never    Marine scientist or Organizations: No    Attends Archivist Meetings: Never    Marital Status: Never married  Intimate Partner Violence: Not At Risk (03/18/2022)   Humiliation, Afraid, Rape, and Kick questionnaire    Fear of Current or Ex-Partner: No    Emotionally Abused: No    Physically Abused: No    Sexually Abused: No     Outpatient Medications Prior to Visit  Medication Sig Dispense Refill   lisinopril-hydrochlorothiazide (ZESTORETIC) 20-25 MG tablet TAKE 1 TABLET BY MOUTH EVERY DAY 90 tablet 1   simvastatin (ZOCOR) 40 MG tablet TAKE 1 TABLET BY MOUTH EVERY DAY 90 tablet 1   HYDROcodone-acetaminophen (NORCO/VICODIN) 5-325 MG tablet Take 1 tablet by mouth every 8 (eight) hours as needed. (Patient not taking: Reported on 06/10/2022) 15 tablet 0   ibuprofen (ADVIL) 200 MG tablet Take 200 mg by mouth every 6 (six) hours as needed. (Patient not taking: Reported on 06/10/2022)     No facility-administered medications prior to visit.    No Known Allergies  Review of Systems  Constitutional:  Negative for chills, fever and malaise/fatigue.  HENT:  Negative for congestion and hearing loss.   Eyes:  Negative for discharge.  Respiratory:  Negative for cough, sputum production and shortness of breath.   Cardiovascular:  Negative for chest pain, palpitations and leg swelling.  Gastrointestinal:  Negative for abdominal pain, blood in stool, constipation, diarrhea, heartburn, nausea and vomiting.  Genitourinary:  Negative for dysuria, frequency, hematuria and urgency.  Musculoskeletal:  Negative for back pain, falls and myalgias.  Skin:  Negative for rash.  Neurological:  Negative for dizziness, sensory change, loss of consciousness, weakness and headaches.  Endo/Heme/Allergies:  Negative for environmental allergies. Does not bruise/bleed easily.  Psychiatric/Behavioral:  Negative for depression and suicidal ideas. The patient is not nervous/anxious and does not have insomnia.        Objective:    Physical Exam Vitals and nursing note reviewed.  Constitutional:      General: She is not in acute distress.    Appearance: Normal appearance. She is not ill-appearing.  HENT:     Head: Normocephalic and atraumatic.     Right Ear: External ear normal.     Left Ear: External ear normal.  Eyes:     Extraocular  Movements: Extraocular movements intact.     Pupils: Pupils are equal, round, and reactive to light.  Cardiovascular:     Rate and Rhythm: Normal rate and regular rhythm.     Heart sounds: Normal heart sounds. No murmur heard.    No gallop.  Pulmonary:     Effort: Pulmonary effort is normal. No respiratory distress.     Breath sounds: Normal breath sounds. No wheezing or rales.  Musculoskeletal:        General: Tenderness present.     Comments: Pain in both kness  R>L  Skin:    General: Skin is  warm and dry.  Neurological:     General: No focal deficit present.     Mental Status: She is alert and oriented to person, place, and time.  Psychiatric:        Mood and Affect: Mood normal.        Behavior: Behavior normal.        Thought Content: Thought content normal.        Judgment: Judgment normal.     BP 130/70 (BP Location: Left Arm, Patient Position: Sitting, Cuff Size: Large)   Pulse 94   Temp 98.1 F (36.7 C) (Oral)   Resp 18   Ht '5\' 4"'$  (1.626 m)   Wt 203 lb (92.1 kg)   LMP 09/24/2005   SpO2 97%   BMI 34.84 kg/m  Wt Readings from Last 3 Encounters:  06/10/22 203 lb (92.1 kg)  05/09/22 204 lb (92.5 kg)  04/29/22 204 lb 6.4 oz (92.7 kg)    Diabetic Foot Exam - Simple   No data filed    Lab Results  Component Value Date   WBC 6.0 04/29/2022   HGB 12.2 04/29/2022   HCT 35.9 (L) 04/29/2022   PLT 218.0 04/29/2022   GLUCOSE 100 (H) 04/29/2022   CHOL 138 04/29/2022   TRIG 149.0 04/29/2022   HDL 52.40 04/29/2022   LDLDIRECT 148.1 04/18/2009   LDLCALC 56 04/29/2022   ALT 14 04/29/2022   AST 15 04/29/2022   NA 135 04/29/2022   K 3.9 04/29/2022   CL 100 04/29/2022   CREATININE 0.74 04/29/2022   BUN 15 04/29/2022   CO2 25 04/29/2022   TSH 1.18 04/03/2015   MICROALBUR 1.2 04/03/2015    Lab Results  Component Value Date   TSH 1.18 04/03/2015   Lab Results  Component Value Date   WBC 6.0 04/29/2022   HGB 12.2 04/29/2022   HCT 35.9 (L) 04/29/2022   MCV  87.2 04/29/2022   PLT 218.0 04/29/2022   Lab Results  Component Value Date   NA 135 04/29/2022   K 3.9 04/29/2022   CO2 25 04/29/2022   GLUCOSE 100 (H) 04/29/2022   BUN 15 04/29/2022   CREATININE 0.74 04/29/2022   BILITOT 0.5 04/29/2022   ALKPHOS 70 04/29/2022   AST 15 04/29/2022   ALT 14 04/29/2022   PROT 6.3 04/29/2022   ALBUMIN 4.4 04/29/2022   CALCIUM 9.5 04/29/2022   GFR 81.43 04/29/2022   Lab Results  Component Value Date   CHOL 138 04/29/2022   Lab Results  Component Value Date   HDL 52.40 04/29/2022   Lab Results  Component Value Date   LDLCALC 56 04/29/2022   Lab Results  Component Value Date   TRIG 149.0 04/29/2022   Lab Results  Component Value Date   CHOLHDL 3 04/29/2022   No results found for: "HGBA1C"     Assessment & Plan:   Problem List Items Addressed This Visit       Unprioritized   Pre-op evaluation - Primary   Relevant Orders   EKG 12-Lead (Completed)   No orders of the defined types were placed in this encounter.   IAnn Held, DO, personally preformed the services described in this documentation.  All medical record entries made by the scribe were at my direction and in my presence.  I have reviewed the chart and discharge instructions (if applicable) and agree that the record reflects my personal performance and is accurate and complete. 06/10/2022   I,Amber Collins,acting as  a scribe for Ann Held, DO.,have documented all relevant documentation on the behalf of Ann Held, DO,as directed by  Ann Held, DO while in the presence of Ann Held, DO.    Ann Held, DO

## 2022-06-26 ENCOUNTER — Telehealth: Payer: Self-pay | Admitting: Family Medicine

## 2022-06-26 NOTE — Telephone Encounter (Signed)
[facility] called stating they received the FL2 form for patient however did not received any of the records they needed along with that (last office note & last labs)   Can we fax this for them or is that medical records?  Their fax is [phone]

## 2022-06-27 NOTE — Telephone Encounter (Signed)
Pt had a pre-op appointment with [place]. We did not do a FL2 form for this patient. OV notes and labs faxed

## 2022-07-02 ENCOUNTER — Other Ambulatory Visit: Payer: Self-pay | Admitting: Family Medicine

## 2022-07-02 DIAGNOSIS — I10 Essential (primary) hypertension: Secondary | ICD-10-CM

## 2022-07-02 DIAGNOSIS — E785 Hyperlipidemia, unspecified: Secondary | ICD-10-CM

## 2022-07-03 NOTE — Telephone Encounter (Signed)
[facility] states they have not received fax and would like s to re-fax notes. Confirmed fax # [phone]

## 2022-07-03 NOTE — Telephone Encounter (Signed)
OV and labs faxed and routed through Epic as well

## 2022-07-08 DIAGNOSIS — R262 Difficulty in walking, not elsewhere classified: Secondary | ICD-10-CM | POA: Diagnosis not present

## 2022-07-08 DIAGNOSIS — M25661 Stiffness of right knee, not elsewhere classified: Secondary | ICD-10-CM | POA: Diagnosis not present

## 2022-07-08 DIAGNOSIS — M1731 Unilateral post-traumatic osteoarthritis, right knee: Secondary | ICD-10-CM | POA: Diagnosis not present

## 2022-07-19 DIAGNOSIS — M170 Bilateral primary osteoarthritis of knee: Secondary | ICD-10-CM | POA: Diagnosis not present

## 2022-07-19 DIAGNOSIS — M1712 Unilateral primary osteoarthritis, left knee: Secondary | ICD-10-CM | POA: Diagnosis not present

## 2022-07-19 DIAGNOSIS — M1711 Unilateral primary osteoarthritis, right knee: Secondary | ICD-10-CM | POA: Diagnosis not present

## 2022-07-31 DIAGNOSIS — M25561 Pain in right knee: Secondary | ICD-10-CM | POA: Diagnosis not present

## 2022-07-31 DIAGNOSIS — M170 Bilateral primary osteoarthritis of knee: Secondary | ICD-10-CM | POA: Diagnosis not present

## 2022-08-15 DIAGNOSIS — Z96651 Presence of right artificial knee joint: Secondary | ICD-10-CM | POA: Diagnosis not present

## 2022-08-15 DIAGNOSIS — M1711 Unilateral primary osteoarthritis, right knee: Secondary | ICD-10-CM | POA: Diagnosis not present

## 2022-08-16 DIAGNOSIS — M1712 Unilateral primary osteoarthritis, left knee: Secondary | ICD-10-CM | POA: Diagnosis not present

## 2022-08-16 DIAGNOSIS — Z96651 Presence of right artificial knee joint: Secondary | ICD-10-CM | POA: Diagnosis not present

## 2022-08-16 DIAGNOSIS — Z7982 Long term (current) use of aspirin: Secondary | ICD-10-CM | POA: Diagnosis not present

## 2022-08-16 DIAGNOSIS — Z471 Aftercare following joint replacement surgery: Secondary | ICD-10-CM | POA: Diagnosis not present

## 2022-08-18 DIAGNOSIS — Z7982 Long term (current) use of aspirin: Secondary | ICD-10-CM | POA: Diagnosis not present

## 2022-08-18 DIAGNOSIS — Z471 Aftercare following joint replacement surgery: Secondary | ICD-10-CM | POA: Diagnosis not present

## 2022-08-18 DIAGNOSIS — Z96651 Presence of right artificial knee joint: Secondary | ICD-10-CM | POA: Diagnosis not present

## 2022-08-18 DIAGNOSIS — M1712 Unilateral primary osteoarthritis, left knee: Secondary | ICD-10-CM | POA: Diagnosis not present

## 2022-08-20 DIAGNOSIS — Z7982 Long term (current) use of aspirin: Secondary | ICD-10-CM | POA: Diagnosis not present

## 2022-08-20 DIAGNOSIS — Z471 Aftercare following joint replacement surgery: Secondary | ICD-10-CM | POA: Diagnosis not present

## 2022-08-20 DIAGNOSIS — Z96651 Presence of right artificial knee joint: Secondary | ICD-10-CM | POA: Diagnosis not present

## 2022-08-20 DIAGNOSIS — M1712 Unilateral primary osteoarthritis, left knee: Secondary | ICD-10-CM | POA: Diagnosis not present

## 2022-08-21 DIAGNOSIS — Z7982 Long term (current) use of aspirin: Secondary | ICD-10-CM | POA: Diagnosis not present

## 2022-08-21 DIAGNOSIS — M1712 Unilateral primary osteoarthritis, left knee: Secondary | ICD-10-CM | POA: Diagnosis not present

## 2022-08-21 DIAGNOSIS — Z471 Aftercare following joint replacement surgery: Secondary | ICD-10-CM | POA: Diagnosis not present

## 2022-08-21 DIAGNOSIS — Z96651 Presence of right artificial knee joint: Secondary | ICD-10-CM | POA: Diagnosis not present

## 2022-08-23 DIAGNOSIS — Z7982 Long term (current) use of aspirin: Secondary | ICD-10-CM | POA: Diagnosis not present

## 2022-08-23 DIAGNOSIS — M1712 Unilateral primary osteoarthritis, left knee: Secondary | ICD-10-CM | POA: Diagnosis not present

## 2022-08-23 DIAGNOSIS — Z471 Aftercare following joint replacement surgery: Secondary | ICD-10-CM | POA: Diagnosis not present

## 2022-08-23 DIAGNOSIS — Z96651 Presence of right artificial knee joint: Secondary | ICD-10-CM | POA: Diagnosis not present

## 2022-08-25 DIAGNOSIS — Z471 Aftercare following joint replacement surgery: Secondary | ICD-10-CM | POA: Diagnosis not present

## 2022-08-25 DIAGNOSIS — Z7982 Long term (current) use of aspirin: Secondary | ICD-10-CM | POA: Diagnosis not present

## 2022-08-25 DIAGNOSIS — Z96651 Presence of right artificial knee joint: Secondary | ICD-10-CM | POA: Diagnosis not present

## 2022-08-25 DIAGNOSIS — M1712 Unilateral primary osteoarthritis, left knee: Secondary | ICD-10-CM | POA: Diagnosis not present

## 2022-08-26 DIAGNOSIS — M1711 Unilateral primary osteoarthritis, right knee: Secondary | ICD-10-CM | POA: Diagnosis not present

## 2022-08-26 DIAGNOSIS — Z96651 Presence of right artificial knee joint: Secondary | ICD-10-CM | POA: Diagnosis not present

## 2022-08-26 DIAGNOSIS — R2689 Other abnormalities of gait and mobility: Secondary | ICD-10-CM | POA: Diagnosis not present

## 2022-08-26 DIAGNOSIS — R531 Weakness: Secondary | ICD-10-CM | POA: Diagnosis not present

## 2022-08-26 DIAGNOSIS — Z9889 Other specified postprocedural states: Secondary | ICD-10-CM | POA: Diagnosis not present

## 2022-08-29 DIAGNOSIS — R531 Weakness: Secondary | ICD-10-CM | POA: Diagnosis not present

## 2022-08-29 DIAGNOSIS — Z9889 Other specified postprocedural states: Secondary | ICD-10-CM | POA: Diagnosis not present

## 2022-08-29 DIAGNOSIS — R2689 Other abnormalities of gait and mobility: Secondary | ICD-10-CM | POA: Diagnosis not present

## 2022-08-29 DIAGNOSIS — Z96651 Presence of right artificial knee joint: Secondary | ICD-10-CM | POA: Diagnosis not present

## 2022-09-02 DIAGNOSIS — Z9889 Other specified postprocedural states: Secondary | ICD-10-CM | POA: Diagnosis not present

## 2022-09-02 DIAGNOSIS — R2689 Other abnormalities of gait and mobility: Secondary | ICD-10-CM | POA: Diagnosis not present

## 2022-09-02 DIAGNOSIS — Z96651 Presence of right artificial knee joint: Secondary | ICD-10-CM | POA: Diagnosis not present

## 2022-09-02 DIAGNOSIS — R531 Weakness: Secondary | ICD-10-CM | POA: Diagnosis not present

## 2022-09-09 DIAGNOSIS — Z9889 Other specified postprocedural states: Secondary | ICD-10-CM | POA: Diagnosis not present

## 2022-09-09 DIAGNOSIS — Z96651 Presence of right artificial knee joint: Secondary | ICD-10-CM | POA: Diagnosis not present

## 2022-09-09 DIAGNOSIS — R2689 Other abnormalities of gait and mobility: Secondary | ICD-10-CM | POA: Diagnosis not present

## 2022-09-09 DIAGNOSIS — R531 Weakness: Secondary | ICD-10-CM | POA: Diagnosis not present

## 2022-09-11 DIAGNOSIS — R531 Weakness: Secondary | ICD-10-CM | POA: Diagnosis not present

## 2022-09-11 DIAGNOSIS — Z9889 Other specified postprocedural states: Secondary | ICD-10-CM | POA: Diagnosis not present

## 2022-09-11 DIAGNOSIS — R2689 Other abnormalities of gait and mobility: Secondary | ICD-10-CM | POA: Diagnosis not present

## 2022-09-11 DIAGNOSIS — Z96651 Presence of right artificial knee joint: Secondary | ICD-10-CM | POA: Diagnosis not present

## 2022-09-16 DIAGNOSIS — M1711 Unilateral primary osteoarthritis, right knee: Secondary | ICD-10-CM | POA: Diagnosis not present

## 2022-09-16 DIAGNOSIS — R2689 Other abnormalities of gait and mobility: Secondary | ICD-10-CM | POA: Diagnosis not present

## 2022-09-16 DIAGNOSIS — Z9889 Other specified postprocedural states: Secondary | ICD-10-CM | POA: Diagnosis not present

## 2022-09-16 DIAGNOSIS — R531 Weakness: Secondary | ICD-10-CM | POA: Diagnosis not present

## 2022-09-16 DIAGNOSIS — Z96651 Presence of right artificial knee joint: Secondary | ICD-10-CM | POA: Diagnosis not present

## 2022-09-17 ENCOUNTER — Encounter: Payer: Self-pay | Admitting: Family Medicine

## 2022-09-18 DIAGNOSIS — R2689 Other abnormalities of gait and mobility: Secondary | ICD-10-CM | POA: Diagnosis not present

## 2022-09-18 DIAGNOSIS — Z9889 Other specified postprocedural states: Secondary | ICD-10-CM | POA: Diagnosis not present

## 2022-09-18 DIAGNOSIS — R531 Weakness: Secondary | ICD-10-CM | POA: Diagnosis not present

## 2022-09-18 DIAGNOSIS — Z96651 Presence of right artificial knee joint: Secondary | ICD-10-CM | POA: Diagnosis not present

## 2022-09-19 ENCOUNTER — Telehealth: Payer: Self-pay | Admitting: *Deleted

## 2022-09-19 NOTE — Telephone Encounter (Signed)
-----   Message from Elberta Spaniel sent at 09/17/2022 11:15 AM EDT ----- Regarding: Pt request Zilretta for L knee Pt request Zilretta for Left knee ( recent surgery on Rt knee)-- Advised pt that we need to do VOB & check for precert requirements prior to her receiving injections. (Pt understands & will schedule appt out in April to allow time for process.  Please let her know when it has been approved.  Thx

## 2022-09-19 NOTE — Telephone Encounter (Signed)
We have no imaging or notes for patient's left knee OA. I called patient. She is gong to have Dr. Jerald Kief office fax over all her medical records regarding her left knee and I will verify Zilretta benefits.

## 2022-09-23 DIAGNOSIS — R531 Weakness: Secondary | ICD-10-CM | POA: Diagnosis not present

## 2022-09-23 DIAGNOSIS — R2689 Other abnormalities of gait and mobility: Secondary | ICD-10-CM | POA: Diagnosis not present

## 2022-09-23 DIAGNOSIS — Z96651 Presence of right artificial knee joint: Secondary | ICD-10-CM | POA: Diagnosis not present

## 2022-09-23 DIAGNOSIS — Z9889 Other specified postprocedural states: Secondary | ICD-10-CM | POA: Diagnosis not present

## 2022-09-23 NOTE — Telephone Encounter (Signed)
Rec'd fax from [employer]. Patient's [place]en is covered by plan at 80%. Patient's responsibility will be 20%. Patient owes a $30 copay for CPT 20610 with remaining covered at 100% by payer. Patient has OOP max of $3800 and has met $160.13. If met, coverage goes to 100% and copays will no longer apply.

## 2022-09-23 NOTE — Telephone Encounter (Signed)
Pt informed of below. OV scheduled.  

## 2022-09-25 DIAGNOSIS — R2689 Other abnormalities of gait and mobility: Secondary | ICD-10-CM | POA: Diagnosis not present

## 2022-09-25 DIAGNOSIS — R531 Weakness: Secondary | ICD-10-CM | POA: Diagnosis not present

## 2022-09-25 DIAGNOSIS — Z9889 Other specified postprocedural states: Secondary | ICD-10-CM | POA: Diagnosis not present

## 2022-09-25 DIAGNOSIS — Z96651 Presence of right artificial knee joint: Secondary | ICD-10-CM | POA: Diagnosis not present

## 2022-09-30 ENCOUNTER — Encounter: Payer: Self-pay | Admitting: Family Medicine

## 2022-09-30 ENCOUNTER — Other Ambulatory Visit: Payer: Self-pay

## 2022-09-30 ENCOUNTER — Ambulatory Visit: Payer: Medicare Other | Admitting: Family Medicine

## 2022-09-30 VITALS — BP 138/60 | Ht 64.0 in | Wt 205.0 lb

## 2022-09-30 DIAGNOSIS — R531 Weakness: Secondary | ICD-10-CM | POA: Diagnosis not present

## 2022-09-30 DIAGNOSIS — M1712 Unilateral primary osteoarthritis, left knee: Secondary | ICD-10-CM

## 2022-09-30 DIAGNOSIS — Z96651 Presence of right artificial knee joint: Secondary | ICD-10-CM | POA: Diagnosis not present

## 2022-09-30 DIAGNOSIS — R2689 Other abnormalities of gait and mobility: Secondary | ICD-10-CM | POA: Diagnosis not present

## 2022-09-30 DIAGNOSIS — Z9889 Other specified postprocedural states: Secondary | ICD-10-CM | POA: Diagnosis not present

## 2022-09-30 MED ORDER — TRIAMCINOLONE ACETONIDE 32 MG IX SRER
32.0000 mg | Freq: Once | INTRA_ARTICULAR | Status: AC
  Administered 2022-09-30: 32 mg via INTRA_ARTICULAR

## 2022-09-30 NOTE — Assessment & Plan Note (Signed)
Acutely occurring with effusion noted on exam today - zilretta injection today.

## 2022-09-30 NOTE — Progress Notes (Signed)
  Heather Pierce - 72 y.o. female MRN [id number]  Date of birth: [birth year]  SUBJECTIVE:  Including CC & ROS.  No chief complaint on file.   Heather Pierce is a 72 y.o. female that is  here for zilretta injection.    Review of Systems See HPI   HISTORY: Past Medical, Surgical, Social, and Family History Reviewed & Updated per EMR.   Pertinent Historical Findings include:  Past Medical History:  Diagnosis Date   Allergy    GERD (gastroesophageal reflux disease)    Hyperlipidemia    Hypertension     Past Surgical History:  Procedure Laterality Date   KNEE ARTHROSCOPY Right 11/29/2009     PHYSICAL EXAM:  VS: BP 138/60 (BP Location: Left Arm, Patient Position: Sitting)   Ht 5\' 4"  (1.626 m)   Wt 205 lb (93 kg)   LMP 09/24/2005   BMI 35.19 kg/m  Physical Exam Gen: NAD, alert, cooperative with exam, well-appearing MSK:  Neurovascularly intact     Aspiration/Injection Procedure Note Heather Pierce 1951/06/26  Procedure: Injection Indications: left knee pain  Procedure Details Consent: Risks of procedure as well as the alternatives and risks of each were explained to the (patient/caregiver).  Consent for procedure obtained. Time Out: Verified patient identification, verified procedure, site/side was marked, verified correct patient position, special equipment/implants available, medications/allergies/relevent history reviewed, required imaging and test results available.  Performed.  The area was cleaned with iodine and alcohol swabs.    The left knee superior lateral suprapatellar pouch was injected using 3 cc of 1% lidocaine on a 25-gauge 1-1/2 inch needle.   The syringe was switched and a mixture containing 5 cc's of 32 mg Zilretta and 4 cc's of 0.25% bupivacaine was injected.  Ultrasound was used. Images were obtained in long views showing the injection.    A sterile dressing was applied.  Patient did tolerate procedure well.       ASSESSMENT & PLAN:   OA  (osteoarthritis) of knee Acutely occurring with effusion noted on exam today - zilretta injection today.

## 2022-09-30 NOTE — Patient Instructions (Signed)
Good to see you Please use ice as needed   Please send me a message in MyChart with any questions or updates.  Please see me back in 4 weeks or as needed if better.   --Dr. Denae Zulueta  

## 2022-10-02 DIAGNOSIS — Z96651 Presence of right artificial knee joint: Secondary | ICD-10-CM | POA: Diagnosis not present

## 2022-10-02 DIAGNOSIS — Z9889 Other specified postprocedural states: Secondary | ICD-10-CM | POA: Diagnosis not present

## 2022-10-02 DIAGNOSIS — R531 Weakness: Secondary | ICD-10-CM | POA: Diagnosis not present

## 2022-10-02 DIAGNOSIS — R2689 Other abnormalities of gait and mobility: Secondary | ICD-10-CM | POA: Diagnosis not present

## 2022-10-07 DIAGNOSIS — R531 Weakness: Secondary | ICD-10-CM | POA: Diagnosis not present

## 2022-10-07 DIAGNOSIS — Z96651 Presence of right artificial knee joint: Secondary | ICD-10-CM | POA: Diagnosis not present

## 2022-10-07 DIAGNOSIS — Z9889 Other specified postprocedural states: Secondary | ICD-10-CM | POA: Diagnosis not present

## 2022-10-07 DIAGNOSIS — R2689 Other abnormalities of gait and mobility: Secondary | ICD-10-CM | POA: Diagnosis not present

## 2022-10-09 ENCOUNTER — Ambulatory Visit: Payer: Medicare Other | Admitting: Family Medicine

## 2022-10-14 ENCOUNTER — Other Ambulatory Visit (HOSPITAL_BASED_OUTPATIENT_CLINIC_OR_DEPARTMENT_OTHER): Payer: Self-pay | Admitting: Family Medicine

## 2022-10-14 ENCOUNTER — Encounter: Payer: Self-pay | Admitting: *Deleted

## 2022-10-14 DIAGNOSIS — Z1231 Encounter for screening mammogram for malignant neoplasm of breast: Secondary | ICD-10-CM

## 2022-10-21 ENCOUNTER — Encounter (HOSPITAL_BASED_OUTPATIENT_CLINIC_OR_DEPARTMENT_OTHER): Payer: Self-pay

## 2022-10-21 ENCOUNTER — Ambulatory Visit (HOSPITAL_BASED_OUTPATIENT_CLINIC_OR_DEPARTMENT_OTHER)
Admission: RE | Admit: 2022-10-21 | Discharge: 2022-10-21 | Disposition: A | Discharge status: Home / Self Care | Payer: Medicare Other | Source: Ambulatory Visit | Attending: Family Medicine | Admitting: Family Medicine

## 2022-10-21 DIAGNOSIS — Z1231 Encounter for screening mammogram for malignant neoplasm of breast: Secondary | ICD-10-CM | POA: Diagnosis not present

## 2022-10-29 ENCOUNTER — Encounter: Payer: Self-pay | Admitting: Family Medicine

## 2022-11-18 ENCOUNTER — Other Ambulatory Visit (HOSPITAL_BASED_OUTPATIENT_CLINIC_OR_DEPARTMENT_OTHER): Payer: Self-pay

## 2022-11-18 MED ORDER — COMIRNATY 30 MCG/0.3ML IM SUSY
0.3000 mL | PREFILLED_SYRINGE | Freq: Once | INTRAMUSCULAR | 0 refills | Status: AC
  Filled 2022-11-18: qty 0.3, 1d supply, fill #0

## 2022-11-20 DIAGNOSIS — M1711 Unilateral primary osteoarthritis, right knee: Secondary | ICD-10-CM | POA: Diagnosis not present

## 2022-11-20 DIAGNOSIS — Z09 Encounter for follow-up examination after completed treatment for conditions other than malignant neoplasm: Secondary | ICD-10-CM | POA: Diagnosis not present

## 2022-12-10 ENCOUNTER — Ambulatory Visit: Payer: Medicare Other | Admitting: Family Medicine

## 2022-12-10 ENCOUNTER — Encounter: Payer: Self-pay | Admitting: Family Medicine

## 2022-12-10 VITALS — BP 140/80 | Ht 64.0 in | Wt 205.0 lb

## 2022-12-10 DIAGNOSIS — M1712 Unilateral primary osteoarthritis, left knee: Secondary | ICD-10-CM

## 2022-12-10 MED ORDER — MELOXICAM 15 MG PO TABS: 15.0000 mg | ORAL_TABLET | Freq: Every day | ORAL | 1 refills | Status: DC

## 2022-12-10 NOTE — Patient Instructions (Addendum)
You flared the arthritis in your knee. Take meloxicam 15mg  daily with food for 7-10 days then as needed. Dont take aleve or ibuprofen while on this. Icing 15 minutes at a time as needed for swelling. Elevate above your heart as needed for swelling. ACE wrap or compression sleeve may be helpful. Follow up with [place] as needed. You can get an injection on or after 7/1.

## 2022-12-11 ENCOUNTER — Encounter: Payer: Self-pay | Admitting: Family Medicine

## 2022-12-11 NOTE — Progress Notes (Signed)
PCP: Donato Schultz, DO  Subjective:   HPI: Patient is a 72 y.o. female here for left knee pain.  Patient last seen on 4/1 and given zilretta injection. Seemed to help until about 2 weeks ago. Doesn't recall acute injury though she did bowl at a birthday party 2 days prior to knee flaring up. Pain is aching, medial.  Past Medical History:  Diagnosis Date   Allergy    GERD (gastroesophageal reflux disease)    Hyperlipidemia    Hypertension     Current Outpatient Medications on File Prior to Visit  Medication Sig Dispense Refill   lisinopril-hydrochlorothiazide (ZESTORETIC) 20-25 MG tablet TAKE 1 TABLET BY MOUTH EVERY DAY 90 tablet 1   simvastatin (ZOCOR) 40 MG tablet TAKE 1 TABLET BY MOUTH EVERY DAY 90 tablet 1   No current facility-administered medications on file prior to visit.    Past Surgical History:  Procedure Laterality Date   KNEE ARTHROSCOPY Right 11/29/2009    No Known Allergies  BP (!) 140/80 (BP Location: Left Arm, Patient Position: Sitting)   Ht 5\' 4"  (1.626 m)   Wt 205 lb (93 kg)   LMP 09/24/2005   BMI 35.19 kg/m       No data to display              No data to display              Objective:  Physical Exam:  Gen: NAD, comfortable in exam room  Left knee: No gross deformity, ecchymoses.  Mod effusion. TTP medial joint line. FROM with normal strength. Negative ant/post drawers. Negative valgus/varus testing. Negative lachman.  Negative mcmurrays, apleys.  NV intact distally.   Assessment & Plan:  1. Left knee pain - 2/2 flare of arthritis.  Meloxicam, icing, elevation, compression.  F/u prn.  Consider injection after 7/1.

## 2022-12-16 DIAGNOSIS — L814 Other melanin hyperpigmentation: Secondary | ICD-10-CM | POA: Diagnosis not present

## 2022-12-16 DIAGNOSIS — D229 Melanocytic nevi, unspecified: Secondary | ICD-10-CM | POA: Diagnosis not present

## 2022-12-16 DIAGNOSIS — L821 Other seborrheic keratosis: Secondary | ICD-10-CM | POA: Diagnosis not present

## 2022-12-16 DIAGNOSIS — D239 Other benign neoplasm of skin, unspecified: Secondary | ICD-10-CM | POA: Diagnosis not present

## 2022-12-16 DIAGNOSIS — Z85820 Personal history of malignant melanoma of skin: Secondary | ICD-10-CM | POA: Diagnosis not present

## 2022-12-16 DIAGNOSIS — L820 Inflamed seborrheic keratosis: Secondary | ICD-10-CM | POA: Diagnosis not present

## 2023-01-07 ENCOUNTER — Other Ambulatory Visit: Payer: Self-pay | Admitting: Family Medicine

## 2023-01-07 DIAGNOSIS — E785 Hyperlipidemia, unspecified: Secondary | ICD-10-CM

## 2023-01-10 ENCOUNTER — Encounter: Payer: Self-pay | Admitting: Family Medicine

## 2023-01-10 ENCOUNTER — Other Ambulatory Visit: Payer: Self-pay

## 2023-01-10 ENCOUNTER — Telehealth: Payer: Self-pay | Admitting: *Deleted

## 2023-01-10 ENCOUNTER — Ambulatory Visit: Payer: Medicare Other | Admitting: Family Medicine

## 2023-01-10 VITALS — BP 130/86 | Ht 64.0 in | Wt 195.0 lb

## 2023-01-10 DIAGNOSIS — M1711 Unilateral primary osteoarthritis, right knee: Secondary | ICD-10-CM | POA: Diagnosis not present

## 2023-01-10 DIAGNOSIS — M1712 Unilateral primary osteoarthritis, left knee: Secondary | ICD-10-CM | POA: Diagnosis not present

## 2023-01-10 MED ORDER — TRIAMCINOLONE ACETONIDE 32 MG IX SRER
32.0000 mg | Freq: Once | INTRA_ARTICULAR | Status: AC
  Administered 2023-01-10: 32 mg via INTRA_ARTICULAR

## 2023-01-10 NOTE — Progress Notes (Signed)
  Heather Pierce - 72 y.o. female MRN [id number]  Date of birth: 01-May-1951    SUBJECTIVE:      Chief Complaint:/ HPI:    Left knee pain and stiffness: Had a Zilretta injection in April that was very helpful for about the first 4 to 6 weeks.  Unfortunately after that she started having symptoms again.  Additionally, about the time she started having symptoms she went bowling and wonders if that aggravated her knee.  Symptoms are mostly pain on the lateral inferior portion of the knee when she stands or does walking.  It feels a little stiff on the left and looks a little swollen to her.No catching, no giving way.  Pain is similar to that she had with the right knee prior to having a knee replacement done this year on the right side.  The left knee pain is not as severe as the right knee was.    PERTINENT  PMH / PSH: I have reviewed the patient's medications, allergies, past medical and surgical history, smoking status.  Pertinent findings that relate to today's visit / issues include: Right TKR 2024 by Dr. Jerl Santos.  OBJECTIVE: BP 130/86   Ht 5\' 4"  (1.626 m)   Wt 195 lb (88.5 kg)   LMP 09/24/2005   BMI 33.47 kg/m   Physical Exam:  Vital signs are reviewed. GENERAL: Well-developed female no acute distress KNEES: Right knee has a normal appearing well-healed scar anterior midline.  Left knee small effusion.  Mild tenderness to palpation across the joint capsule area medially and inferiorly to the patella.  Also some medial joint line tenderness.  Full range of motion but stiffness with the last 10 degrees of extension.  She is ligamentously intact to varus and valgus stress.   ULTRASOUND: Left knee: Small amount of fluid seen in the suprapatellar pouch.  Medial meniscus shows significant abnormality but not a lot of fluid.  It is not extruded.  There are osteophytes at the joint line.  Lateral meniscus maintains its normal appearance.  Osteophytes are also present at the joint line laterally.   Patellar and quadricep tendon are intact.  PROCEDURE: INJECTION: Patient was given informed consent, signed copy in the chart. Appropriate time out was taken. Area prepped and draped in usual sterile fashion.  INJECTION: 3 cc of 1% lidocaine was used for initial anesthesia, utilizing ethylene chloride for needle placement.   After adequate anesthesia, A 21 gauge 1 1/2 inch needle was used to inject a mixture of 4 cc bupivicaine mixed with Zilretta in diluent (per package instructions)  into the left knee using a(n) anterior medial approach.   The patient tolerated the procedure well. There were no complications. Post procedure instructions were given.   ASSESSMENT & PLAN:  See problem based charting & AVS for pt instructions. No problem-specific Assessment & Plan notes found for this encounter.

## 2023-01-10 NOTE — Assessment & Plan Note (Signed)
First injection of Zilretta April, 2024.  Second injection was performed today.  Ultrasound prior to injection showed some small amount of fluid in the suprapatellar pouch.  I discussed with her and I do not think it is worth draining I do not think it would interfere with the shot.  The last Zilretta injection only lasted about 4 to 6 weeks.  She is anxious to see how this wound does.  She is aware that if she is not getting the full 3 months, it might be time to consider going forward with surgical intervention such as knee replacement.  She will follow-up with [place] as needed.

## 2023-01-10 NOTE — Telephone Encounter (Signed)
Rec'd summary of benefits from [employer] for patient's left knee Zilretta. No PA is req'd Patient's responsibility for J3304 will be 20%. With remaining covered at 80% by plan. Patient owes a $30 copay for CPT 20610. Deductibles do not apply. Patient has a $30 copay whether OV is billed or not. Patient has a OOP max of $3800 and has accumulated $735.64. If OOP is met, coverage goes to 100% and copays no longer apply.

## 2023-02-07 DIAGNOSIS — M1711 Unilateral primary osteoarthritis, right knee: Secondary | ICD-10-CM | POA: Diagnosis not present

## 2023-02-07 DIAGNOSIS — M25562 Pain in left knee: Secondary | ICD-10-CM | POA: Diagnosis not present

## 2023-02-11 ENCOUNTER — Telehealth: Payer: Self-pay

## 2023-02-11 NOTE — Telephone Encounter (Signed)
Received Pre-op form from [facility]. Date of Surgery is TBA.  Pt has a CPE appt on 10/31. Please schedule patient for separate appointment.

## 2023-02-21 ENCOUNTER — Encounter: Payer: Self-pay | Admitting: Family Medicine

## 2023-02-21 ENCOUNTER — Ambulatory Visit: Payer: Medicare Other | Admitting: Family Medicine

## 2023-02-21 VITALS — BP 118/70 | HR 81 | Temp 98.0°F | Resp 18 | Ht 64.0 in | Wt 192.8 lb

## 2023-02-21 DIAGNOSIS — I10 Essential (primary) hypertension: Secondary | ICD-10-CM

## 2023-02-21 DIAGNOSIS — E785 Hyperlipidemia, unspecified: Secondary | ICD-10-CM | POA: Diagnosis not present

## 2023-02-21 DIAGNOSIS — Z01818 Encounter for other preprocedural examination: Secondary | ICD-10-CM | POA: Diagnosis not present

## 2023-02-21 NOTE — Progress Notes (Signed)
Established Patient Office Visit  Subjective   Patient ID: Heather Pierce, female    DOB: 03/07/1951  Age: 72 y.o. MRN: [id number]  Chief Complaint  Patient presents with   Pre-op Exam    LTK arthoplasty,      HPI Discussed the use of AI scribe software for clinical note transcription with the patient, who gave verbal consent to proceed.  History of Present Illness   The patient, with a history of right knee replacement, is now preparing for a left knee replacement due to severe pain from bone-on-bone contact. The right knee replacement was successful, with the patient reporting a quick recovery and minimal use of walking aids. The patient has been compliant with physical therapy and has been released from further sessions. The patient has lost approximately 15 pounds since her last visit, which she attributes to a lack of appetite following her previous surgery. The patient also reports a recent emotional stressor, the unexpected loss of their pet.      Patient Active Problem List   Diagnosis Date Noted   Pre-op evaluation 06/10/2022   Melanoma of skin (HCC) 11/15/2021   Keratosis 11/15/2021   OA (osteoarthritis) of knee 05/31/2019   Primary osteoarthritis of right knee 05/03/2019   Obesity (BMI 30-39.9) 07/15/2013   Hyperlipidemia 09/25/2010   Preventative health care 09/25/2010   GERD 03/15/2008   Essential hypertension 12/17/2007   Past Medical History:  Diagnosis Date   Allergy    GERD (gastroesophageal reflux disease)    Hyperlipidemia    Hypertension    Past Surgical History:  Procedure Laterality Date   KNEE ARTHROSCOPY Right 11/29/2009   Social History   Tobacco Use   Smoking status: Never   Smokeless tobacco: Never  Substance Use Topics   Alcohol use: Yes    Comment: occ. maybe 2 drinks per month per pt.   Drug use: No   Social History   Socioeconomic History   Marital status: Single    Spouse name: Not on file   Number of children: Not on file    Years of education: Not on file   Highest education level: Bachelor's degree (e.g., BA, AB, BS)  Occupational History   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    Employer: [employer]  Tobacco Use   Smoking status: Never   Smokeless tobacco: Never  Substance and Sexual Activity   Alcohol use: Yes    Comment: occ. maybe 2 drinks per month per pt.   Drug use: No   Sexual activity: Never    Partners: Male    Birth control/protection: None  Other Topics Concern   Not on file  Social History Narrative   Exercise-- walking dog   Social Determinants of Health   Financial Resource Strain: Low Risk  (02/18/2023)   Overall Financial Resource Strain (CARDIA)    Difficulty of Paying Living Expenses: Not hard at all  Food Insecurity: No Food Insecurity (02/18/2023)   Hunger Vital Sign    Worried About Running Out of Food in the Last Year: Never true    Ran Out of Food in the Last Year: Never true  Transportation Needs: No Transportation Needs (02/18/2023)   PRAPARE - Administrator, Civil Service (Medical): No    Lack of Transportation (Non-Medical): No  Physical Activity: Unknown (02/18/2023)   Exercise Vital Sign    Days of Exercise per Week: Patient declined    Minutes of Exercise per Session: 30 min  Stress: No Stress Concern Present (02/18/2023)   [employer] of Occupational Health - Occupational Stress Questionnaire    Feeling of Stress : Only a little  Social Connections: Unknown (02/18/2023)   Social Connection and Isolation Panel [NHANES]    Frequency of Communication with Friends and Family: Twice a week    Frequency of Social Gatherings with Friends and Family: Once a week    Attends Religious Services: Patient declined    Database administrator or Organizations: No    Attends Banker Meetings: Never    Marital Status: Never married  Intimate Partner Violence: Not At Risk (03/18/2022)   Humiliation, Afraid, Rape, and Kick questionnaire     Fear of Current or Ex-Partner: No    Emotionally Abused: No    Physically Abused: No    Sexually Abused: No   Family Status  Relation Name Status   Father  Deceased   Mother  Deceased   Other  (Not Specified)   Other  (Not Specified)   Other  (Not Specified)   Other  (Not Specified)   Other  (Not Specified)   MGM  (Not Specified)  No partnership data on file   Family History  Problem Relation Age of Onset   Jaundice Father    GER disease Father    COPD Father    Dementia Father    Osteoarthritis Father        In nursing home   Cancer Father        melanoma   Alzheimer's disease Father    Colon polyps Father    Alzheimer's disease Mother    Dementia Mother    Arthritis Other    Hyperlipidemia Other    Hypertension Other    Coronary artery disease Other        1st degree relative @60s    Melanoma Other    Colon cancer Maternal Grandmother 60   No Known Allergies    Review of Systems  Constitutional:  Negative for chills, fever and malaise/fatigue.  HENT:  Negative for congestion and hearing loss.   Eyes:  Negative for blurred vision and discharge.  Respiratory:  Negative for cough, sputum production and shortness of breath.   Cardiovascular:  Negative for chest pain, palpitations and leg swelling.  Gastrointestinal:  Negative for abdominal pain, blood in stool, constipation, diarrhea, heartburn, nausea and vomiting.  Genitourinary:  Negative for dysuria, frequency, hematuria and urgency.  Musculoskeletal:  Negative for back pain, falls and myalgias.  Skin:  Negative for rash.  Neurological:  Negative for dizziness, sensory change, loss of consciousness, weakness and headaches.  Endo/Heme/Allergies:  Negative for environmental allergies. Does not bruise/bleed easily.  Psychiatric/Behavioral:  Negative for depression and suicidal ideas. The patient is not nervous/anxious and does not have insomnia.       Objective:     BP 118/70 (BP Location: Left Arm, Patient  Position: Sitting, Cuff Size: Large)   Pulse 81   Temp 98 F (36.7 C) (Oral)   Resp 18   Ht 5\' 4"  (1.626 m)   Wt 192 lb 12.8 oz (87.5 kg)   LMP 09/24/2005   SpO2 98%   BMI 33.09 kg/m  BP Readings from Last 3 Encounters:  02/21/23 118/70  01/10/23 130/86  12/10/22 (!) 140/80   Wt Readings from Last 3 Encounters:  02/21/23 192 lb 12.8 oz (87.5 kg)  01/10/23 195 lb (88.5 kg)  12/10/22 205 lb (93 kg)   SpO2 Readings from Last 3 Encounters:  02/21/23 98%  06/10/22 97%  04/29/22 98%      Physical Exam Vitals and nursing note reviewed.  Constitutional:      General: She is not in acute distress.    Appearance: Normal appearance. She is well-developed.  HENT:     Head: Normocephalic and atraumatic.  Eyes:     General: No scleral icterus.       Right eye: No discharge.        Left eye: No discharge.  Cardiovascular:     Rate and Rhythm: Normal rate and regular rhythm.     Heart sounds: No murmur heard. Pulmonary:     Effort: Pulmonary effort is normal. No respiratory distress.     Breath sounds: Normal breath sounds.  Musculoskeletal:        General: Normal range of motion.     Cervical back: Normal range of motion and neck supple.     Right lower leg: No edema.     Left lower leg: No edema.  Skin:    General: Skin is warm and dry.  Neurological:     General: No focal deficit present.     Mental Status: She is alert and oriented to person, place, and time.  Psychiatric:        Mood and Affect: Mood normal.        Behavior: Behavior normal.        Thought Content: Thought content normal.        Judgment: Judgment normal.      No results found for any visits on 02/21/23.  Last CBC Lab Results  Component Value Date   WBC 6.0 04/29/2022   HGB 12.2 04/29/2022   HCT 35.9 (L) 04/29/2022   MCV 87.2 04/29/2022   RDW 12.5 04/29/2022   PLT 218.0 04/29/2022   Last metabolic panel Lab Results  Component Value Date   GLUCOSE 100 (H) 04/29/2022   NA 135  04/29/2022   K 3.9 04/29/2022   CL 100 04/29/2022   CO2 25 04/29/2022   BUN 15 04/29/2022   CREATININE 0.74 04/29/2022   GFR 81.43 04/29/2022   CALCIUM 9.5 04/29/2022   PROT 6.3 04/29/2022   ALBUMIN 4.4 04/29/2022   BILITOT 0.5 04/29/2022   ALKPHOS 70 04/29/2022   AST 15 04/29/2022   ALT 14 04/29/2022   Last lipids Lab Results  Component Value Date   CHOL 138 04/29/2022   HDL 52.40 04/29/2022   LDLCALC 56 04/29/2022   LDLDIRECT 148.1 04/18/2009   TRIG 149.0 04/29/2022   CHOLHDL 3 04/29/2022   Last hemoglobin A1c No results found for: "HGBA1C" Last thyroid functions Lab Results  Component Value Date   TSH 1.18 04/03/2015   Last vitamin D No results found for: "25OHVITD2", "25OHVITD3", "VD25OH" Last vitamin B12 and Folate No results found for: "VITAMINB12", "FOLATE"   EKG--  nsr  The 10-year ASCVD risk score (Arnett DK, et al., 2019) is: 12.4%    Assessment & Plan:   Problem List Items Addressed This Visit       Unprioritized   Pre-op evaluation    Surgery center to check the labs  Pt cleared for knee replacement       Hyperlipidemia    Encourage heart healthy diet such as MIND or DASH diet, increase exercise, avoid trans fats, simple carbohydrates and processed foods, consider a krill or fish or flaxseed oil cap daily.        Essential hypertension    Well controlled, no changes  to meds. Encouraged heart healthy diet such as the DASH diet and exercise as tolerated.        Other Visit Diagnoses     Preop examination    -  Primary   Relevant Orders   EKG 12-Lead (Completed)       No follow-ups on file.    Donato Schultz, DO                 1

## 2023-02-21 NOTE — Assessment & Plan Note (Addendum)
Surgery center to check the labs  Pt cleared for knee replacement

## 2023-02-21 NOTE — Assessment & Plan Note (Signed)
Encourage heart healthy diet such as MIND or DASH diet, increase exercise, avoid trans fats, simple carbohydrates and processed foods, consider a krill or fish or flaxseed oil cap daily.  °

## 2023-02-21 NOTE — Assessment & Plan Note (Signed)
Well controlled, no changes to meds. Encouraged heart healthy diet such as the DASH diet and exercise as tolerated.  °

## 2023-02-24 ENCOUNTER — Telehealth: Payer: Self-pay | Admitting: Family Medicine

## 2023-02-24 NOTE — Telephone Encounter (Signed)
[facility] states they are still needing the last ov notes and any labs from the last 6 months. She states we can fax this to [phone] or [phone].

## 2023-02-24 NOTE — Telephone Encounter (Signed)
OV, labs, and clearance faxed agai

## 2023-03-10 DIAGNOSIS — M25562 Pain in left knee: Secondary | ICD-10-CM | POA: Diagnosis not present

## 2023-03-10 DIAGNOSIS — M25561 Pain in right knee: Secondary | ICD-10-CM | POA: Diagnosis not present

## 2023-03-20 ENCOUNTER — Other Ambulatory Visit: Payer: Self-pay | Admitting: Family Medicine

## 2023-03-20 DIAGNOSIS — I10 Essential (primary) hypertension: Secondary | ICD-10-CM

## 2023-04-17 DIAGNOSIS — G8918 Other acute postprocedural pain: Secondary | ICD-10-CM | POA: Diagnosis not present

## 2023-04-17 DIAGNOSIS — Z96652 Presence of left artificial knee joint: Secondary | ICD-10-CM | POA: Diagnosis not present

## 2023-04-17 DIAGNOSIS — M1712 Unilateral primary osteoarthritis, left knee: Secondary | ICD-10-CM | POA: Diagnosis not present

## 2023-04-18 DIAGNOSIS — Z96653 Presence of artificial knee joint, bilateral: Secondary | ICD-10-CM | POA: Diagnosis not present

## 2023-04-18 DIAGNOSIS — Z604 Social exclusion and rejection: Secondary | ICD-10-CM | POA: Diagnosis not present

## 2023-04-18 DIAGNOSIS — Z471 Aftercare following joint replacement surgery: Secondary | ICD-10-CM | POA: Diagnosis not present

## 2023-04-18 DIAGNOSIS — I10 Essential (primary) hypertension: Secondary | ICD-10-CM | POA: Diagnosis not present

## 2023-04-18 DIAGNOSIS — Z79891 Long term (current) use of opiate analgesic: Secondary | ICD-10-CM | POA: Diagnosis not present

## 2023-04-18 DIAGNOSIS — Z7982 Long term (current) use of aspirin: Secondary | ICD-10-CM | POA: Diagnosis not present

## 2023-04-21 DIAGNOSIS — Z96653 Presence of artificial knee joint, bilateral: Secondary | ICD-10-CM | POA: Diagnosis not present

## 2023-04-21 DIAGNOSIS — Z604 Social exclusion and rejection: Secondary | ICD-10-CM | POA: Diagnosis not present

## 2023-04-21 DIAGNOSIS — Z79891 Long term (current) use of opiate analgesic: Secondary | ICD-10-CM | POA: Diagnosis not present

## 2023-04-21 DIAGNOSIS — Z7982 Long term (current) use of aspirin: Secondary | ICD-10-CM | POA: Diagnosis not present

## 2023-04-21 DIAGNOSIS — I10 Essential (primary) hypertension: Secondary | ICD-10-CM | POA: Diagnosis not present

## 2023-04-21 DIAGNOSIS — Z471 Aftercare following joint replacement surgery: Secondary | ICD-10-CM | POA: Diagnosis not present

## 2023-04-23 DIAGNOSIS — I10 Essential (primary) hypertension: Secondary | ICD-10-CM | POA: Diagnosis not present

## 2023-04-23 DIAGNOSIS — Z7982 Long term (current) use of aspirin: Secondary | ICD-10-CM | POA: Diagnosis not present

## 2023-04-23 DIAGNOSIS — Z79891 Long term (current) use of opiate analgesic: Secondary | ICD-10-CM | POA: Diagnosis not present

## 2023-04-23 DIAGNOSIS — Z471 Aftercare following joint replacement surgery: Secondary | ICD-10-CM | POA: Diagnosis not present

## 2023-04-23 DIAGNOSIS — Z604 Social exclusion and rejection: Secondary | ICD-10-CM | POA: Diagnosis not present

## 2023-04-23 DIAGNOSIS — Z96653 Presence of artificial knee joint, bilateral: Secondary | ICD-10-CM | POA: Diagnosis not present

## 2023-04-25 DIAGNOSIS — Z7982 Long term (current) use of aspirin: Secondary | ICD-10-CM | POA: Diagnosis not present

## 2023-04-25 DIAGNOSIS — Z79891 Long term (current) use of opiate analgesic: Secondary | ICD-10-CM | POA: Diagnosis not present

## 2023-04-25 DIAGNOSIS — Z96653 Presence of artificial knee joint, bilateral: Secondary | ICD-10-CM | POA: Diagnosis not present

## 2023-04-25 DIAGNOSIS — Z471 Aftercare following joint replacement surgery: Secondary | ICD-10-CM | POA: Diagnosis not present

## 2023-04-25 DIAGNOSIS — I10 Essential (primary) hypertension: Secondary | ICD-10-CM | POA: Diagnosis not present

## 2023-04-25 DIAGNOSIS — Z604 Social exclusion and rejection: Secondary | ICD-10-CM | POA: Diagnosis not present

## 2023-04-27 DIAGNOSIS — Z96653 Presence of artificial knee joint, bilateral: Secondary | ICD-10-CM | POA: Diagnosis not present

## 2023-04-27 DIAGNOSIS — Z604 Social exclusion and rejection: Secondary | ICD-10-CM | POA: Diagnosis not present

## 2023-04-27 DIAGNOSIS — Z7982 Long term (current) use of aspirin: Secondary | ICD-10-CM | POA: Diagnosis not present

## 2023-04-27 DIAGNOSIS — Z79891 Long term (current) use of opiate analgesic: Secondary | ICD-10-CM | POA: Diagnosis not present

## 2023-04-27 DIAGNOSIS — I10 Essential (primary) hypertension: Secondary | ICD-10-CM | POA: Diagnosis not present

## 2023-04-27 DIAGNOSIS — Z471 Aftercare following joint replacement surgery: Secondary | ICD-10-CM | POA: Diagnosis not present

## 2023-04-28 DIAGNOSIS — M25662 Stiffness of left knee, not elsewhere classified: Secondary | ICD-10-CM | POA: Diagnosis not present

## 2023-04-28 DIAGNOSIS — Z96652 Presence of left artificial knee joint: Secondary | ICD-10-CM | POA: Diagnosis not present

## 2023-04-28 DIAGNOSIS — M6281 Muscle weakness (generalized): Secondary | ICD-10-CM | POA: Diagnosis not present

## 2023-04-28 DIAGNOSIS — M25562 Pain in left knee: Secondary | ICD-10-CM | POA: Diagnosis not present

## 2023-04-30 DIAGNOSIS — M6281 Muscle weakness (generalized): Secondary | ICD-10-CM | POA: Diagnosis not present

## 2023-04-30 DIAGNOSIS — M25662 Stiffness of left knee, not elsewhere classified: Secondary | ICD-10-CM | POA: Diagnosis not present

## 2023-04-30 DIAGNOSIS — Z96652 Presence of left artificial knee joint: Secondary | ICD-10-CM | POA: Diagnosis not present

## 2023-05-01 ENCOUNTER — Encounter: Payer: Medicare Other | Admitting: Family Medicine

## 2023-05-06 DIAGNOSIS — Z96652 Presence of left artificial knee joint: Secondary | ICD-10-CM | POA: Diagnosis not present

## 2023-05-06 DIAGNOSIS — M25662 Stiffness of left knee, not elsewhere classified: Secondary | ICD-10-CM | POA: Diagnosis not present

## 2023-05-06 DIAGNOSIS — M6281 Muscle weakness (generalized): Secondary | ICD-10-CM | POA: Diagnosis not present

## 2023-05-08 DIAGNOSIS — M6281 Muscle weakness (generalized): Secondary | ICD-10-CM | POA: Diagnosis not present

## 2023-05-08 DIAGNOSIS — Z96652 Presence of left artificial knee joint: Secondary | ICD-10-CM | POA: Diagnosis not present

## 2023-05-08 DIAGNOSIS — M25662 Stiffness of left knee, not elsewhere classified: Secondary | ICD-10-CM | POA: Diagnosis not present

## 2023-05-13 DIAGNOSIS — Z96652 Presence of left artificial knee joint: Secondary | ICD-10-CM | POA: Diagnosis not present

## 2023-05-13 DIAGNOSIS — M25662 Stiffness of left knee, not elsewhere classified: Secondary | ICD-10-CM | POA: Diagnosis not present

## 2023-05-13 DIAGNOSIS — M6281 Muscle weakness (generalized): Secondary | ICD-10-CM | POA: Diagnosis not present

## 2023-05-16 DIAGNOSIS — M25662 Stiffness of left knee, not elsewhere classified: Secondary | ICD-10-CM | POA: Diagnosis not present

## 2023-05-16 DIAGNOSIS — M6281 Muscle weakness (generalized): Secondary | ICD-10-CM | POA: Diagnosis not present

## 2023-05-16 DIAGNOSIS — Z96652 Presence of left artificial knee joint: Secondary | ICD-10-CM | POA: Diagnosis not present

## 2023-05-19 DIAGNOSIS — Z96652 Presence of left artificial knee joint: Secondary | ICD-10-CM | POA: Diagnosis not present

## 2023-05-19 DIAGNOSIS — M25662 Stiffness of left knee, not elsewhere classified: Secondary | ICD-10-CM | POA: Diagnosis not present

## 2023-05-19 DIAGNOSIS — M6281 Muscle weakness (generalized): Secondary | ICD-10-CM | POA: Diagnosis not present

## 2023-05-21 DIAGNOSIS — Z96652 Presence of left artificial knee joint: Secondary | ICD-10-CM | POA: Diagnosis not present

## 2023-05-21 DIAGNOSIS — M25662 Stiffness of left knee, not elsewhere classified: Secondary | ICD-10-CM | POA: Diagnosis not present

## 2023-05-21 DIAGNOSIS — M6281 Muscle weakness (generalized): Secondary | ICD-10-CM | POA: Diagnosis not present

## 2023-06-05 ENCOUNTER — Encounter: Payer: Self-pay | Admitting: Family Medicine

## 2023-06-05 ENCOUNTER — Other Ambulatory Visit (HOSPITAL_BASED_OUTPATIENT_CLINIC_OR_DEPARTMENT_OTHER): Payer: Self-pay | Admitting: Family Medicine

## 2023-06-05 ENCOUNTER — Ambulatory Visit: Payer: Medicare Other | Admitting: Family Medicine

## 2023-06-05 ENCOUNTER — Ambulatory Visit: Payer: Medicare Other | Admitting: *Deleted

## 2023-06-05 VITALS — BP 124/54 | HR 80 | Ht 64.0 in | Wt 194.6 lb

## 2023-06-05 VITALS — BP 124/54 | HR 80 | Temp 97.7°F | Resp 18 | Ht 64.0 in | Wt 194.6 lb

## 2023-06-05 DIAGNOSIS — Z0000 Encounter for general adult medical examination without abnormal findings: Secondary | ICD-10-CM

## 2023-06-05 DIAGNOSIS — Z139 Encounter for screening, unspecified: Secondary | ICD-10-CM

## 2023-06-05 DIAGNOSIS — Z23 Encounter for immunization: Secondary | ICD-10-CM

## 2023-06-05 DIAGNOSIS — E2839 Other primary ovarian failure: Secondary | ICD-10-CM | POA: Diagnosis not present

## 2023-06-05 DIAGNOSIS — E785 Hyperlipidemia, unspecified: Secondary | ICD-10-CM

## 2023-06-05 DIAGNOSIS — I10 Essential (primary) hypertension: Secondary | ICD-10-CM | POA: Diagnosis not present

## 2023-06-05 LAB — CBC WITH DIFFERENTIAL/PLATELET
Basophils Absolute: 0 10*3/uL (ref 0.0–0.1)
Basophils Relative: 0.4 % (ref 0.0–3.0)
Eosinophils Absolute: 0.1 10*3/uL (ref 0.0–0.7)
Eosinophils Relative: 1.3 % (ref 0.0–5.0)
HCT: 36.8 % (ref 36.0–46.0)
Hemoglobin: 12.8 g/dL (ref 12.0–15.0)
Lymphocytes Relative: 14.6 % (ref 12.0–46.0)
Lymphs Abs: 1 10*3/uL (ref 0.7–4.0)
MCHC: 34.8 g/dL (ref 30.0–36.0)
MCV: 88.3 fL (ref 78.0–100.0)
Monocytes Absolute: 0.5 10*3/uL (ref 0.1–1.0)
Monocytes Relative: 7.4 % (ref 3.0–12.0)
Neutro Abs: 5.3 10*3/uL (ref 1.4–7.7)
Neutrophils Relative %: 76.3 % (ref 43.0–77.0)
Platelets: 193 10*3/uL (ref 150.0–400.0)
RBC: 4.17 Mil/uL (ref 3.87–5.11)
RDW: 13.3 % (ref 11.5–15.5)
WBC: 6.9 10*3/uL (ref 4.0–10.5)

## 2023-06-05 LAB — COMPREHENSIVE METABOLIC PANEL
ALT: 13 U/L (ref 0–35)
AST: 15 U/L (ref 0–37)
Albumin: 4.5 g/dL (ref 3.5–5.2)
Alkaline Phosphatase: 74 U/L (ref 39–117)
BUN: 24 mg/dL — ABNORMAL HIGH (ref 6–23)
CO2: 30 meq/L (ref 19–32)
Calcium: 9.6 mg/dL (ref 8.4–10.5)
Chloride: 100 meq/L (ref 96–112)
Creatinine, Ser: 0.81 mg/dL (ref 0.40–1.20)
GFR: 72.49 mL/min (ref >60.00)
Glucose, Bld: 93 mg/dL (ref 70–99)
Potassium: 3.8 meq/L (ref 3.5–5.1)
Sodium: 138 meq/L (ref 135–145)
Total Bilirubin: 0.6 mg/dL (ref 0.2–1.2)
Total Protein: 6.7 g/dL (ref 6.0–8.3)

## 2023-06-05 LAB — LIPID PANEL
Cholesterol: 163 mg/dL (ref 0–200)
HDL: 57.3 mg/dL (ref >39.00)
LDL Cholesterol: 78 mg/dL (ref 0–99)
NonHDL: 105.5
Total CHOL/HDL Ratio: 3
Triglycerides: 137 mg/dL (ref 0.0–149.0)
VLDL: 27.4 mg/dL (ref 0.0–40.0)

## 2023-06-05 LAB — TSH: TSH: 0.93 u[IU]/mL (ref 0.35–5.50)

## 2023-06-05 MED ORDER — SIMVASTATIN 40 MG PO TABS: 40.0000 mg | ORAL_TABLET | Freq: Every day | ORAL | 1 refills | Status: DC

## 2023-06-05 MED ORDER — LISINOPRIL-HYDROCHLOROTHIAZIDE 20-25 MG PO TABS: 1.0000 | ORAL_TABLET | Freq: Every day | ORAL | 1 refills | Status: DC

## 2023-06-05 NOTE — Progress Notes (Signed)
Subjective:   Heather Pierce is a 72 y.o. female who presents for Medicare Annual (Subsequent) preventive examination.  Visit Complete: In person  Patient Medicare AWV questionnaire was completed by the patient on 06/01/23; I have confirmed that all information answered by patient is correct and no changes since this date.  Cardiac Risk Factors include: advanced age (>66men, >24 women);dyslipidemia;hypertension;obesity (BMI >30kg/m2)     Objective:    Today's Vitals   06/05/23 0941  BP: (!) 124/54  Pulse: 80  Weight: 194 lb 9.6 oz (88.3 kg)  Height: 5\' 4"  (1.626 m)   Body mass index is 33.4 kg/m.     06/05/2023   10:09 AM 03/18/2022   10:05 AM 12/21/2021    8:52 AM 06/01/2020    9:45 AM 05/17/2019    1:17 PM 05/08/2018    2:06 PM 06/26/2017    4:10 PM  Advanced Directives  Does Patient Have a Medical Advance Directive? Yes Yes Yes Yes Yes Yes Yes  Type of Estate agent of [place];Living will Healthcare Power of [place];Living will Healthcare Power of [employer] of [place];Living will Healthcare Power of [place];Living will Healthcare Power of [place];Living will Healthcare Power of Attorney  Does patient want to make changes to medical advance directive? No - Patient declined  No - Patient declined  No - Patient declined No - Patient declined No - Patient declined  Copy of Healthcare Power of Attorney in Chart? No - copy requested No - copy requested Yes - validated most recent copy scanned in chart (See row information) Yes - validated most recent copy scanned in chart (See row information) No - copy requested Yes - validated most recent copy scanned in chart (See row information)     Current Medications (verified) Outpatient Encounter Medications as of 06/05/2023  Medication Sig   ibuprofen (ADVIL) 800 MG tablet Take 400 mg by mouth 2 (two) times daily as needed.   lisinopril-hydrochlorothiazide (ZESTORETIC) 20-25 MG tablet Take 1  tablet by mouth daily.   simvastatin (ZOCOR) 40 MG tablet Take 1 tablet (40 mg total) by mouth daily.   No facility-administered encounter medications on file as of 06/05/2023.    Allergies (verified) Patient has no known allergies.   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   KNEE ARTHROSCOPY Right 11/29/2009   Family History  Problem Relation Age of Onset   Jaundice Father    GER disease Father    COPD Father    Dementia Father    Osteoarthritis Father        In nursing home   Cancer Father        melanoma   Alzheimer's disease Father    Colon polyps Father    Arthritis Father    Alzheimer's disease Mother    Dementia Mother    Arthritis Other    Hyperlipidemia Other    Hypertension Other    Coronary artery disease Other        1st degree relative @60s    Melanoma Other    Colon cancer Maternal Grandmother 13   Social History   Socioeconomic History   Marital status: Single    Spouse name: Not on file   Number of children: Not  on file   Years of education: Not on file   Highest education level: Bachelor's degree (e.g., BA, AB, BS)  Occupational History   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    Employer: [employer]  Tobacco Use   Smoking status: Never   Smokeless tobacco: Never  Substance and Sexual Activity   Alcohol use: Yes    Comment: Social   Drug use: No   Sexual activity: Not Currently    Partners: Male    Birth control/protection: Abstinence, Post-menopausal  Other Topics Concern   Not on file  Social History Narrative   Exercise-- walking dog   Social Determinants of Health   Financial Resource Strain: Low Risk  (06/01/2023)   Overall Financial Resource Strain (CARDIA)    Difficulty of Paying Living  Expenses: Not hard at all  Food Insecurity: No Food Insecurity (06/01/2023)   Hunger Vital Sign    Worried About Running Out of Food in the Last Year: Never true    Ran Out of Food in the Last Year: Never true  Transportation Needs: No Transportation Needs (06/01/2023)   PRAPARE - Administrator, Civil Service (Medical): No    Lack of Transportation (Non-Medical): No  Physical Activity: Patient Declined (06/01/2023)   Exercise Vital Sign    Days of Exercise per Week: Patient declined    Minutes of Exercise per Session: Patient declined  Stress: No Stress Concern Present (06/01/2023)   [employer] of Occupational Health - Occupational Stress Questionnaire    Feeling of Stress : Not at all  Social Connections: Unknown (06/01/2023)   Social Connection and Isolation Panel [NHANES]    Frequency of Communication with Friends and Family: Once a week    Frequency of Social Gatherings with Friends and Family: Once a week    Attends Religious Services: Patient declined    Database administrator or Organizations: No    Attends Engineer, structural: Never    Marital Status: Never married    Tobacco Counseling Counseling given: Not Answered   Clinical Intake:  Pre-visit preparation completed: Yes  Pain : No/denies pain  BMI - recorded: 33.4 Nutritional Status: BMI > 30  Obese Nutritional Risks: None Diabetes: No  How often do you need to have someone help you when you read instructions, pamphlets, or other written materials from your doctor or pharmacy?: 1 - Never  Interpreter Needed?: No  Information entered by :: [employer], CMA   Activities of Daily Living    06/01/2023   12:51 PM  In your present state of health, do you have any difficulty performing the following activities:  Hearing? 0  Vision? 0  Difficulty concentrating or making decisions? 0  Walking or climbing stairs? 1  Dressing or bathing? 0  Doing errands, shopping? 0  Preparing Food  and eating ? N  Using the Toilet? N  In the past six months, have you accidently leaked urine? N  Do you have problems with loss of bowel control? N  Managing your Medications? N  Managing your Finances? N  Housekeeping or managing your Housekeeping? N    Patient Care Team: Zola Button, Grayling Congress, DO as PCP - General Adonis Huguenin, NP as Nurse Practitioner (Orthopedic Surgery)  Indicate any recent Medical Services you may have received from other than [facility] providers in the past year (date may be approximate).     Assessment:   This is a routine wellness examination for [place].  Hearing/Vision screen No results found.   Goals Addressed   None    Depression Screen    06/05/2023   10:07 AM 04/29/2022   10:03 AM 03/18/2022   10:10 AM 11/15/2021    8:52 AM 06/01/2020    9:51 AM 05/17/2019    1:28 PM 05/08/2018    2:06 PM  PHQ 2/9 Scores  PHQ - 2 Score 2 1 1  0 0 0 0    Fall Risk    06/01/2023   12:51 PM 04/29/2022   10:03 AM 03/18/2022   10:08 AM 03/11/2022    5:52 PM 11/15/2021    8:52 AM  Fall Risk   Falls in the past year? 0 0 0 0 0  Number falls in past yr: 0 0 0 0 0  Injury with Fall? 0 0 0 0 0  Risk for fall due to : No Fall Risks      Follow up Falls evaluation completed Falls evaluation completed Falls prevention discussed  Falls evaluation completed    MEDICARE RISK AT HOME: Medicare Risk at Home Any stairs in or around the home?: Yes If so, are there any without handrails?: No Home free of loose throw rugs in walkways, pet beds, electrical cords, etc?: Yes Adequate lighting in your home to reduce risk of falls?: Yes Life alert?: No Use of a cane, walker or w/c?: No Grab bars in the bathroom?: No Shower chair or bench in shower?: Yes Elevated toilet seat or a handicapped toilet?: No  TIMED UP AND GO:  Was the test performed?  Yes  Length of time to ambulate 10 feet: 7 sec Gait slow and steady without use of assistive device    Cognitive Function:     05/03/2016    9:44 AM  MMSE - Mini Mental State Exam  Orientation to time 5  Orientation to Place 5  Registration 3  Attention/ Calculation 5  Recall 3  Language- name 2 objects 2  Language- repeat 1  Language- follow 3 step command 3  Language- read & follow direction 1  Write a sentence 1  Copy design 1  Total score 30        06/05/2023   10:19 AM 03/18/2022   10:18 AM  6CIT Screen  What Year? 0 points 0 points  What month? 0 points 0 points  What time? 0 points 0 points  Count back from 20 0 points 0 points  Months in reverse 0 points 0 points  Repeat phrase 0 points 0 points  Total Score 0 points 0 points    Immunizations Immunization History  Administered Date(s) Administered   Fluad Quad(high Dose 65+) 03/05/2019, 03/28/2020, 04/24/2021, 04/29/2022   Influenza, High Dose Seasonal PF 04/15/2017, 05/08/2018   Influenza,inj,Quad PF,6+ Mos 05/04/2013, 03/25/2016   Influenza-Unspecified 06/01/2011, 03/22/2014, 03/21/2015   PFIZER Comirnaty(Gray Top)Covid-19 Tri-Sucrose Vaccine 01/25/2021   PFIZER(Purple Top)SARS-COV-2 Vaccination 08/08/2019, 09/02/2019, 04/03/2020   Pfizer Covid-19 Vaccine Bivalent Booster 51yrs & up 05/17/2021   Pfizer(Comirnaty)Fall Seasonal Vaccine 12 years and older 11/18/2022   Pneumococcal Conjugate-13 05/03/2016   Pneumococcal Polysaccharide-23 05/20/2017   Respiratory Syncytial Virus Vaccine,Recomb Aduvanted(Arexvy) 05/30/2022   Td 04/18/2009   Zoster, Live 11/12/2012    TDAP status: Due, Education has been provided regarding the importance of this vaccine. Advised may receive this vaccine at local pharmacy or Health Dept. Aware to provide a copy of the vaccination record if obtained from local pharmacy or Health Dept. Verbalized acceptance and understanding.  Flu Vaccine status: Completed  at today's visit  Pneumococcal vaccine status: Up to date  Covid-19 vaccine status: Information provided on how to obtain vaccines.   Qualifies for  Shingles Vaccine? Yes   Zostavax completed Yes   Shingrix Completed?: No.    Education has been provided regarding the importance of this vaccine. Patient has been advised to call insurance company to determine out of pocket expense if they have not yet received this vaccine. Advised may also receive vaccine at local pharmacy or Health Dept. Verbalized acceptance and understanding.  Screening Tests Health Maintenance  Topic Date Due   Zoster Vaccines- Shingrix (1 of 2) 12/13/1969   DTaP/Tdap/Td (2 - Tdap) 04/19/2019   Colonoscopy  01/16/2023   INFLUENZA VACCINE  01/30/2023   COVID-19 Vaccine (7 - 2023-24 season) 03/02/2023   Medicare Annual Wellness (AWV)  03/19/2023   DEXA SCAN  09/12/2023   MAMMOGRAM  10/21/2023   Pneumonia Vaccine 62+ Years old  Completed   Hepatitis C Screening  Completed   HPV VACCINES  Aged Out    Health Maintenance  Health Maintenance Due  Topic Date Due   Zoster Vaccines- Shingrix (1 of 2) 12/13/1969   DTaP/Tdap/Td (2 - Tdap) 04/19/2019   Colonoscopy  01/16/2023   INFLUENZA VACCINE  01/30/2023   COVID-19 Vaccine (7 - 2023-24 season) 03/02/2023   Medicare Annual Wellness (AWV)  03/19/2023    Colorectal cancer screening: Type of screening: Colonoscopy. Completed 01/15/13. Repeat every 10 years  Mammogram status: Completed 10/21/22. Repeat every year  Bone Density status: Completed 09/11/21. Results reflect: Bone density results: NORMAL. Repeat every 2 years.  Lung Cancer Screening: (Low Dose CT Chest recommended if Age 51-80 years, 20 pack-year currently smoking OR have quit w/in 15years.) does not qualify.   Additional Screening:  Hepatitis C Screening: does qualify; Completed 04/03/15  Vision Screening: Recommended annual ophthalmology exams for early detection of glaucoma and other disorders of the eye. Is the patient up to date with their annual eye exam?  Yes  Who is the provider or what is the name of the office in which the patient attends  annual eye exams? [facility] If pt is not established with a provider, would they like to be referred to a provider to establish care? No .   Dental Screening: Recommended annual dental exams for proper oral hygiene  Diabetic Foot Exam: N/a  Community Resource Referral / Chronic Care Management: CRR required this visit?  No   CCM required this visit?  No     Plan:     I have personally reviewed and noted the following in the patient's chart:   Medical and social history Use of alcohol, tobacco or illicit drugs  Current medications and supplements including opioid prescriptions. Patient is not currently taking opioid prescriptions. Functional ability and status Nutritional status Physical activity Advanced directives List of other physicians Hospitalizations, surgeries, and ER visits in previous 12 months Vitals Screenings to include cognitive, depression, and falls Referrals and appointments  In addition, I have reviewed and discussed with patient certain preventive protocols, quality metrics, and best practice recommendations. A written personalized care plan for preventive services as well as general preventive health recommendations were provided to patient.     Donne Anon, CMA   06/05/2023   After Visit Summary: (In Person-Declined) Patient declined AVS at this time.  Nurse Notes: None

## 2023-06-05 NOTE — Assessment & Plan Note (Signed)
Ghm utd Check labs  Health Maintenance  Topic Date Due   Zoster Vaccines- Shingrix (1 of 2) 12/13/1969   DTaP/Tdap/Td (2 - Tdap) 04/19/2019   Colonoscopy  01/16/2023   COVID-19 Vaccine (7 - 2023-24 season) 03/02/2023   DEXA SCAN  09/12/2023   MAMMOGRAM  10/21/2023   Medicare Annual Wellness (AWV)  06/04/2024   Pneumonia Vaccine 31+ Years old  Completed   INFLUENZA VACCINE  Completed   Hepatitis C Screening  Completed   HPV VACCINES  Aged Out

## 2023-06-05 NOTE — Progress Notes (Signed)
Established Patient Office Visit  Subjective   Patient ID: Heather Pierce, female    DOB: Dec 28, 1950  Age: 72 y.o. MRN: [id number]  Chief Complaint  Patient presents with   Annual Exam    HPI Discussed the use of AI scribe software for clinical note transcription with the patient, who gave verbal consent to proceed.  History of Present Illness   The patient, with a history of bilateral knee surgeries in February and October of the current year, presents for a routine physical and lab work. She reports that the recovery from the second surgery has been more challenging, with significant bruising and discomfort. The patient describes the pain as more of an ache than acute pain, which has been managed with one extra strength Tylenol and half of an 800mg  ibuprofen tablet. She has been experiencing sinus issues due to weather changes but denies any other new or concerning symptoms. The patient has been maintaining her weight despite the surgeries and is compliant with her current medication regimen. She has not had any recent dental work due to the recent knee surgeries and plans to schedule a colonoscopy and bone density test in the upcoming year.      Patient Active Problem List   Diagnosis Date Noted   Pre-op evaluation 06/10/2022   Melanoma of skin (HCC) 11/15/2021   Keratosis 11/15/2021   OA (osteoarthritis) of knee 05/31/2019   Primary osteoarthritis of right knee 05/03/2019   Obesity (BMI 30-39.9) 07/15/2013   Hyperlipidemia 09/25/2010   Preventative health care 09/25/2010   GERD 03/15/2008   Essential hypertension 12/17/2007   Past Medical History:  Diagnosis Date   Allergy    Arthritis    GERD (gastroesophageal reflux disease)    Hyperlipidemia    Hypertension    Past Surgical History:  Procedure Laterality Date   JOINT REPLACEMENT  Feb/Oct 24   Both knees replaced this year   KNEE ARTHROSCOPY Right 11/29/2009   Social History   Tobacco Use   Smoking status: Never    Smokeless tobacco: Never  Substance Use Topics   Alcohol use: Yes    Comment: Social   Drug use: No   Social History   Socioeconomic History   Marital status: Single    Spouse name: Not on file   Number of children: Not on file   Years of education: Not on file   Highest education level: Bachelor's degree (e.g., BA, AB, BS)  Occupational History   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    Employer: [employer]  Tobacco Use   Smoking status: Never   Smokeless tobacco: Never  Substance and Sexual Activity   Alcohol use: Yes    Comment: Social   Drug use: No   Sexual activity: Not Currently    Partners: Male    Birth control/protection: Abstinence, Post-menopausal  Other Topics Concern   Not on file  Social History Narrative   Exercise--walking   Social Determinants of Health   Financial Resource Strain: Low Risk  (06/01/2023)   Overall Financial Resource Strain (CARDIA)    Difficulty of Paying Living Expenses: Not hard at all  Food Insecurity: No Food Insecurity (06/01/2023)   Hunger Vital Sign    Worried About Running Out of Food in the Last Year: Never true    Ran Out of Food in the Last Year: Never true  Transportation Needs: No Transportation Needs (06/01/2023)   PRAPARE - Administrator, Civil Service (Medical): No  Lack of Transportation (Non-Medical): No  Physical Activity: Patient Declined (06/01/2023)   Exercise Vital Sign    Days of Exercise per Week: Patient declined    Minutes of Exercise per Session: Patient declined  Stress: No Stress Concern Present (06/01/2023)   [employer] of Occupational Health - Occupational Stress Questionnaire    Feeling of Stress : Not at all  Social Connections: Unknown (06/01/2023)   Social Connection and Isolation Panel [NHANES]    Frequency of Communication with Friends and Family: Once a week    Frequency of Social Gatherings with Friends and Family: Once a week    Attends Religious Services:  Patient declined    Database administrator or Organizations: No    Attends Banker Meetings: Never    Marital Status: Never married  Intimate Partner Violence: Not At Risk (06/05/2023)   Humiliation, Afraid, Rape, and Kick questionnaire    Fear of Current or Ex-Partner: No    Emotionally Abused: No    Physically Abused: No    Sexually Abused: No   Family Status  Relation Name Status   Father Jaleiyah Berstler Deceased   Mother  Deceased   Other  (Not Specified)   Other  (Not Specified)   Other  (Not Specified)   Other  (Not Specified)   Other  (Not Specified)   MGM  (Not Specified)  No partnership data on file   Family History  Problem Relation Age of Onset   Jaundice Father    GER disease Father    COPD Father    Dementia Father    Osteoarthritis Father        In nursing home   Cancer Father        melanoma   Alzheimer's disease Father    Colon polyps Father    Arthritis Father    Alzheimer's disease Mother    Dementia Mother    Arthritis Other    Hyperlipidemia Other    Hypertension Other    Coronary artery disease Other        1st degree relative @60s    Melanoma Other    Colon cancer Maternal Grandmother 60   No Known Allergies    Review of Systems  Constitutional:  Negative for chills, fever and malaise/fatigue.  HENT:  Negative for congestion and hearing loss.   Eyes:  Negative for blurred vision and discharge.  Respiratory:  Negative for cough, sputum production and shortness of breath.   Cardiovascular:  Negative for chest pain, palpitations and leg swelling.  Gastrointestinal:  Negative for abdominal pain, blood in stool, constipation, diarrhea, heartburn, nausea and vomiting.  Genitourinary:  Negative for dysuria, frequency, hematuria and urgency.  Musculoskeletal:  Negative for back pain, falls and myalgias.  Skin:  Negative for rash.  Neurological:  Negative for dizziness, sensory change, loss of consciousness, weakness and headaches.   Endo/Heme/Allergies:  Negative for environmental allergies. Does not bruise/bleed easily.  Psychiatric/Behavioral:  Negative for depression and suicidal ideas. The patient is not nervous/anxious and does not have insomnia.       Objective:     BP (!) 124/54   Pulse 80   Temp 97.7 F (36.5 C)   Resp 18   Ht 5\' 4"  (1.626 m)   Wt 194 lb 9.6 oz (88.3 kg)   LMP 09/24/2005   SpO2 96%   BMI 33.40 kg/m  BP Readings from Last 3 Encounters:  06/05/23 (!) 124/54  06/05/23 (!) 124/54  02/21/23 118/70  Wt Readings from Last 3 Encounters:  06/05/23 194 lb 9.6 oz (88.3 kg)  06/05/23 194 lb 9.6 oz (88.3 kg)  02/21/23 192 lb 12.8 oz (87.5 kg)   SpO2 Readings from Last 3 Encounters:  06/05/23 96%  02/21/23 98%  06/10/22 97%      Physical Exam Vitals and nursing note reviewed.  Constitutional:      General: She is not in acute distress.    Appearance: Normal appearance. She is well-developed.  HENT:     Head: Normocephalic and atraumatic.     Right Ear: Tympanic membrane, ear canal and external ear normal. There is no impacted cerumen.     Left Ear: Tympanic membrane, ear canal and external ear normal. There is no impacted cerumen.     Nose: Nose normal.     Mouth/Throat:     Mouth: Mucous membranes are moist.     Pharynx: Oropharynx is clear. No oropharyngeal exudate or posterior oropharyngeal erythema.  Eyes:     General: No scleral icterus.       Right eye: No discharge.        Left eye: No discharge.     Conjunctiva/sclera: Conjunctivae normal.     Pupils: Pupils are equal, round, and reactive to light.  Neck:     Thyroid: No thyromegaly or thyroid tenderness.     Vascular: No JVD.  Cardiovascular:     Rate and Rhythm: Normal rate and regular rhythm.     Heart sounds: Normal heart sounds. No murmur heard. Pulmonary:     Effort: Pulmonary effort is normal. No respiratory distress.     Breath sounds: Normal breath sounds.  Abdominal:     General: Bowel sounds are  normal. There is no distension.     Palpations: Abdomen is soft. There is no mass.     Tenderness: There is no abdominal tenderness. There is no guarding or rebound.  Genitourinary:    Vagina: Normal.  Musculoskeletal:        General: Normal range of motion.     Cervical back: Normal range of motion and neck supple.     Right lower leg: No edema.     Left lower leg: No edema.  Lymphadenopathy:     Cervical: No cervical adenopathy.  Skin:    General: Skin is warm and dry.     Findings: No erythema or rash.  Neurological:     Mental Status: She is alert and oriented to person, place, and time.     Cranial Nerves: No cranial nerve deficit.     Deep Tendon Reflexes: Reflexes are normal and symmetric.  Psychiatric:        Mood and Affect: Mood normal.        Behavior: Behavior normal.        Thought Content: Thought content normal.        Judgment: Judgment normal.      No results found for any visits on 06/05/23.  Last CBC Lab Results  Component Value Date   WBC 6.0 04/29/2022   HGB 12.2 04/29/2022   HCT 35.9 (L) 04/29/2022   MCV 87.2 04/29/2022   RDW 12.5 04/29/2022   PLT 218.0 04/29/2022   Last metabolic panel Lab Results  Component Value Date   GLUCOSE 100 (H) 04/29/2022   NA 135 04/29/2022   K 3.9 04/29/2022   CL 100 04/29/2022   CO2 25 04/29/2022   BUN 15 04/29/2022   CREATININE 0.74 04/29/2022   GFR 81.43  04/29/2022   CALCIUM 9.5 04/29/2022   PROT 6.3 04/29/2022   ALBUMIN 4.4 04/29/2022   BILITOT 0.5 04/29/2022   ALKPHOS 70 04/29/2022   AST 15 04/29/2022   ALT 14 04/29/2022   Last lipids Lab Results  Component Value Date   CHOL 138 04/29/2022   HDL 52.40 04/29/2022   LDLCALC 56 04/29/2022   LDLDIRECT 148.1 04/18/2009   TRIG 149.0 04/29/2022   CHOLHDL 3 04/29/2022   Last hemoglobin A1c No results found for: "HGBA1C" Last thyroid functions Lab Results  Component Value Date   TSH 1.18 04/03/2015   Last vitamin D No results found for:  "25OHVITD2", "25OHVITD3", "VD25OH" Last vitamin B12 and Folate No results found for: "VITAMINB12", "FOLATE"    The 10-year ASCVD risk score (Arnett DK, et al., 2019) is: 13.6%    Assessment & Plan:   Problem List Items Addressed This Visit       Unprioritized   Hyperlipidemia   Relevant Medications   lisinopril-hydrochlorothiazide (ZESTORETIC) 20-25 MG tablet   simvastatin (ZOCOR) 40 MG tablet   Other Relevant Orders   CBC with Differential/Platelet   Comprehensive metabolic panel   Lipid panel   TSH   Essential hypertension   Relevant Medications   lisinopril-hydrochlorothiazide (ZESTORETIC) 20-25 MG tablet   simvastatin (ZOCOR) 40 MG tablet   Other Relevant Orders   CBC with Differential/Platelet   Comprehensive metabolic panel   Lipid panel   TSH   Preventative health care - Primary    Ghm utd Check labs  Health Maintenance  Topic Date Due   Zoster Vaccines- Shingrix (1 of 2) 12/13/1969   DTaP/Tdap/Td (2 - Tdap) 04/19/2019   Colonoscopy  01/16/2023   COVID-19 Vaccine (7 - 2023-24 season) 03/02/2023   DEXA SCAN  09/12/2023   MAMMOGRAM  10/21/2023   Medicare Annual Wellness (AWV)  06/04/2024   Pneumonia Vaccine 49+ Years old  Completed   INFLUENZA VACCINE  Completed   Hepatitis C Screening  Completed   HPV VACCINES  Aged Out         Other Visit Diagnoses     Hyperlipidemia LDL goal <100       Relevant Medications   lisinopril-hydrochlorothiazide (ZESTORETIC) 20-25 MG tablet   simvastatin (ZOCOR) 40 MG tablet   Estrogen deficiency       Relevant Orders   DG BONE DENSITY (DXA)     Assessment and Plan    Postoperative Knee Pain   Following bilateral knee surgeries in February and October, she has experienced postoperative knee pain, with the right knee surgery in October presenting more issues, including significant bruising and prolonged pain. She is managing the pain with extra strength Tylenol and ibuprofen, with a current focus on gradually  reducing the ibuprofen dosage due to the discussed risks of prolonged NSAID use, such as gastrointestinal issues and kidney function. The plan is to continue with extra strength Tylenol and ibuprofen, gradually tapering off the ibuprofen as tolerated.  Sinus Congestion   She is experiencing sinus congestion likely due to weather changes, without other symptoms. The approach includes managing the congestion with over-the-counter medications as needed, and monitoring for any worsening symptoms.  General Health Maintenance   During a discussion on routine health maintenance, it was noted that she is up to date with her flu vaccination and mammogram, though a colonoscopy has been deferred. The importance of shingles and tetanus vaccines, the need for a COVID-19 booster, scheduling a bone density scan for March 2025, and a mammogram for  April 2025 were emphasized. Additionally, there is a plan to follow up on the colonoscopy as per GI recommendations, administer the tetanus and shingles vaccines at the pharmacy, and schedule the COVID-19 booster shot.        Return in about 1 year (around 06/04/2024) for fasting, annual exam.    Donato Schultz, DO

## 2023-06-05 NOTE — Patient Instructions (Signed)
Preventive Care 65 Years and Older, Female Preventive care refers to lifestyle choices and visits with your health care provider that can promote health and wellness. Preventive care visits are also called wellness exams. What can I expect for my preventive care visit? Counseling Your health care provider may ask you questions about your: Medical history, including: Past medical problems. Family medical history. Pregnancy and menstrual history. History of falls. Current health, including: Memory and ability to understand (cognition). Emotional well-being. Home life and relationship well-being. Sexual activity and sexual health. Lifestyle, including: Alcohol, nicotine or tobacco, and drug use. Access to firearms. Diet, exercise, and sleep habits. Work and work environment. Sunscreen use. Safety issues such as seatbelt and bike helmet use. Physical exam Your health care provider will check your: Height and weight. These may be used to calculate your BMI (body mass index). BMI is a measurement that tells if you are at a healthy weight. Waist circumference. This measures the distance around your waistline. This measurement also tells if you are at a healthy weight and may help predict your risk of certain diseases, such as type 2 diabetes and high blood pressure. Heart rate and blood pressure. Body temperature. Skin for abnormal spots. What immunizations do I need?  Vaccines are usually given at various ages, according to a schedule. Your health care provider will recommend vaccines for you based on your age, medical history, and lifestyle or other factors, such as travel or where you work. What tests do I need? Screening Your health care provider may recommend screening tests for certain conditions. This may include: Lipid and cholesterol levels. Hepatitis C test. Hepatitis B test. HIV (human immunodeficiency virus) test. STI (sexually transmitted infection) testing, if you are at  risk. Lung cancer screening. Colorectal cancer screening. Diabetes screening. This is done by checking your blood sugar (glucose) after you have not eaten for a while (fasting). Mammogram. Talk with your health care provider about how often you should have regular mammograms. BRCA-related cancer screening. This may be done if you have a family history of breast, ovarian, tubal, or peritoneal cancers. Bone density scan. This is done to screen for osteoporosis. Talk with your health care provider about your test results, treatment options, and if necessary, the need for more tests. Follow these instructions at home: Eating and drinking  Eat a diet that includes fresh fruits and vegetables, whole grains, lean protein, and low-fat dairy products. Limit your intake of foods with high amounts of sugar, saturated fats, and salt. Take vitamin and mineral supplements as recommended by your health care provider. Do not drink alcohol if your health care provider tells you not to drink. If you drink alcohol: Limit how much you have to 0-1 drink a day. Know how much alcohol is in your drink. In the [place] one drink equals one 12 oz bottle of beer (355 mL), one 5 oz glass of wine (148 mL), or one 1 oz glass of hard liquor (44 mL). Lifestyle Brush your teeth every morning and night with fluoride toothpaste. Floss one time each day. Exercise for at least 30 minutes 5 or more days each week. Do not use any products that contain nicotine or tobacco. These products include cigarettes, chewing tobacco, and vaping devices, such as e-cigarettes. If you need help quitting, ask your health care provider. Do not use drugs. If you are sexually active, practice safe sex. Use a condom or other form of protection in order to prevent STIs. Take aspirin only as told by   your health care provider. Make sure that you understand how much to take and what form to take. Work with your health care provider to find out whether it  is safe and beneficial for you to take aspirin daily. Ask your health care provider if you need to take a cholesterol-lowering medicine (statin). Find healthy ways to manage stress, such as: Meditation, yoga, or listening to music. Journaling. Talking to a trusted person. Spending time with friends and family. Minimize exposure to UV radiation to reduce your risk of skin cancer. Safety Always wear your seat belt while driving or riding in a vehicle. Do not drive: If you have been drinking alcohol. Do not ride with someone who has been drinking. When you are tired or distracted. While texting. If you have been using any mind-altering substances or drugs. Wear a helmet and other protective equipment during sports activities. If you have firearms in your house, make sure you follow all gun safety procedures. What's next? Visit your health care provider once a year for an annual wellness visit. Ask your health care provider how often you should have your eyes and teeth checked. Stay up to date on all vaccines. This information is not intended to replace advice given to you by your health care provider. Make sure you discuss any questions you have with your health care provider. Document Revised: 12/13/2020 Document Reviewed: 12/13/2020 Elsevier Patient Education  2024 [employer]  

## 2023-06-05 NOTE — Patient Instructions (Signed)
Heather Pierce , Thank you for taking time to come for your Medicare Wellness Visit. I appreciate your ongoing commitment to your health goals. Please review the following plan we discussed and let me know if I can assist you in the future.     This is a list of the screening recommended for you and due dates:  Health Maintenance  Topic Date Due   Zoster (Shingles) Vaccine (1 of 2) 12/13/1969   DTaP/Tdap/Td vaccine (2 - Tdap) 04/19/2019   Colon Cancer Screening  01/16/2023   COVID-19 Vaccine (7 - 2023-24 season) 03/02/2023   DEXA scan (bone density measurement)  09/12/2023   Mammogram  10/21/2023   Medicare Annual Wellness Visit  06/04/2024   Pneumonia Vaccine  Completed   Flu Shot  Completed   Hepatitis C Screening  Completed   HPV Vaccine  Aged Out    Next appointment: Follow up in one year for your annual wellness visit   Preventive Care 65 Years and Older, Female Preventive care refers to lifestyle choices and visits with your health care provider that can promote health and wellness. What does preventive care include? A yearly physical exam. This is also called an annual well check. Dental exams once or twice a year. Routine eye exams. Ask your health care provider how often you should have your eyes checked. Personal lifestyle choices, including: Daily care of your teeth and gums. Regular physical activity. Eating a healthy diet. Avoiding tobacco and drug use. Limiting alcohol use. Practicing safe sex. Taking low-dose aspirin every day. Taking vitamin and mineral supplements as recommended by your health care provider. What happens during an annual well check? The services and screenings done by your health care provider during your annual well check will depend on your age, overall health, lifestyle risk factors, and family history of disease. Counseling  Your health care provider may ask you questions about your: Alcohol use. Tobacco use. Drug use. Emotional  well-being. Home and relationship well-being. Sexual activity. Eating habits. History of falls. Memory and ability to understand (cognition). Work and work Astronomer. Reproductive health. Screening  You may have the following tests or measurements: Height, weight, and BMI. Blood pressure. Lipid and cholesterol levels. These may be checked every 5 years, or more frequently if you are over 23 years old. Skin check. Lung cancer screening. You may have this screening every year starting at age 26 if you have a 30-pack-year history of smoking and currently smoke or have quit within the past 15 years. Fecal occult blood test (FOBT) of the stool. You may have this test every year starting at age 72. Flexible sigmoidoscopy or colonoscopy. You may have a sigmoidoscopy every 5 years or a colonoscopy every 10 years starting at age 53. Hepatitis C blood test. Hepatitis B blood test. Sexually transmitted disease (STD) testing. Diabetes screening. This is done by checking your blood sugar (glucose) after you have not eaten for a while (fasting). You may have this done every 1-3 years. Bone density scan. This is done to screen for osteoporosis. You may have this done starting at age 36. Mammogram. This may be done every 1-2 years. Talk to your health care provider about how often you should have regular mammograms. Talk with your health care provider about your test results, treatment options, and if necessary, the need for more tests. Vaccines  Your health care provider may recommend certain vaccines, such as: Influenza vaccine. This is recommended every year. Tetanus, diphtheria, and acellular pertussis (Tdap, Td) vaccine.  You may need a Td booster every 10 years. Zoster vaccine. You may need this after age 88. Pneumococcal 13-valent conjugate (PCV13) vaccine. One dose is recommended after age 15. Pneumococcal polysaccharide (PPSV23) vaccine. One dose is recommended after age 4. Talk to your  health care provider about which screenings and vaccines you need and how often you need them. This information is not intended to replace advice given to you by your health care provider. Make sure you discuss any questions you have with your health care provider. Document Released: 07/14/2015 Document Revised: 03/06/2016 Document Reviewed: 04/18/2015 Elsevier Interactive Patient Education  2017 [employer].  Fall Prevention in the Home Falls can cause injuries. They can happen to people of all ages. There are many things you can do to make your home safe and to help prevent falls. What can I do on the outside of my home? Regularly fix the edges of walkways and driveways and fix any cracks. Remove anything that might make you trip as you walk through a door, such as a raised step or threshold. Trim any bushes or trees on the path to your home. Use bright outdoor lighting. Clear any walking paths of anything that might make someone trip, such as rocks or tools. Regularly check to see if handrails are loose or broken. Make sure that both sides of any steps have handrails. Any raised decks and porches should have guardrails on the edges. Have any leaves, snow, or ice cleared regularly. Use sand or salt on walking paths during winter. Clean up any spills in your garage right away. This includes oil or grease spills. What can I do in the bathroom? Use night lights. Install grab bars by the toilet and in the tub and shower. Do not use towel bars as grab bars. Use non-skid mats or decals in the tub or shower. If you need to sit down in the shower, use a plastic, non-slip stool. Keep the floor dry. Clean up any water that spills on the floor as soon as it happens. Remove soap buildup in the tub or shower regularly. Attach bath mats securely with double-sided non-slip rug tape. Do not have throw rugs and other things on the floor that can make you trip. What can I do in the bedroom? Use night  lights. Make sure that you have a light by your bed that is easy to reach. Do not use any sheets or blankets that are too big for your bed. They should not hang down onto the floor. Have a firm chair that has side arms. You can use this for support while you get dressed. Do not have throw rugs and other things on the floor that can make you trip. What can I do in the kitchen? Clean up any spills right away. Avoid walking on wet floors. Keep items that you use a lot in easy-to-reach places. If you need to reach something above you, use a strong step stool that has a grab bar. Keep electrical cords out of the way. Do not use floor polish or wax that makes floors slippery. If you must use wax, use non-skid floor wax. Do not have throw rugs and other things on the floor that can make you trip. What can I do with my stairs? Do not leave any items on the stairs. Make sure that there are handrails on both sides of the stairs and use them. Fix handrails that are broken or loose. Make sure that handrails are as long as  the stairways. Check any carpeting to make sure that it is firmly attached to the stairs. Fix any carpet that is loose or worn. Avoid having throw rugs at the top or bottom of the stairs. If you do have throw rugs, attach them to the floor with carpet tape. Make sure that you have a light switch at the top of the stairs and the bottom of the stairs. If you do not have them, ask someone to add them for you. What else can I do to help prevent falls? Wear shoes that: Do not have high heels. Have rubber bottoms. Are comfortable and fit you well. Are closed at the toe. Do not wear sandals. If you use a stepladder: Make sure that it is fully opened. Do not climb a closed stepladder. Make sure that both sides of the stepladder are locked into place. Ask someone to hold it for you, if possible. Clearly mark and make sure that you can see: Any grab bars or handrails. First and last  steps. Where the edge of each step is. Use tools that help you move around (mobility aids) if they are needed. These include: Canes. Walkers. Scooters. Crutches. Turn on the lights when you go into a dark area. Replace any light bulbs as soon as they burn out. Set up your furniture so you have a clear path. Avoid moving your furniture around. If any of your floors are uneven, fix them. If there are any pets around you, be aware of where they are. Review your medicines with your doctor. Some medicines can make you feel dizzy. This can increase your chance of falling. Ask your doctor what other things that you can do to help prevent falls. This information is not intended to replace advice given to you by your health care provider. Make sure you discuss any questions you have with your health care provider. Document Released: 04/13/2009 Document Revised: 11/23/2015 Document Reviewed: 07/22/2014 Elsevier Interactive Patient Education  2017 [employer].

## 2023-06-09 DIAGNOSIS — M6281 Muscle weakness (generalized): Secondary | ICD-10-CM | POA: Diagnosis not present

## 2023-06-09 DIAGNOSIS — M25662 Stiffness of left knee, not elsewhere classified: Secondary | ICD-10-CM | POA: Diagnosis not present

## 2023-06-09 DIAGNOSIS — Z96652 Presence of left artificial knee joint: Secondary | ICD-10-CM | POA: Diagnosis not present

## 2023-06-13 ENCOUNTER — Other Ambulatory Visit (HOSPITAL_BASED_OUTPATIENT_CLINIC_OR_DEPARTMENT_OTHER): Payer: Self-pay

## 2023-06-13 MED ORDER — COMIRNATY 30 MCG/0.3ML IM SUSY
0.3000 mL | PREFILLED_SYRINGE | Freq: Once | INTRAMUSCULAR | 0 refills | Status: AC
  Filled 2023-06-13: qty 0.3, 1d supply, fill #0

## 2023-06-16 ENCOUNTER — Encounter: Payer: Self-pay | Admitting: Family Medicine

## 2023-08-04 DIAGNOSIS — M1712 Unilateral primary osteoarthritis, left knee: Secondary | ICD-10-CM | POA: Diagnosis not present

## 2023-08-04 DIAGNOSIS — M1711 Unilateral primary osteoarthritis, right knee: Secondary | ICD-10-CM | POA: Diagnosis not present

## 2023-08-04 DIAGNOSIS — M1990 Unspecified osteoarthritis, unspecified site: Secondary | ICD-10-CM | POA: Diagnosis not present

## 2023-10-29 ENCOUNTER — Other Ambulatory Visit (HOSPITAL_BASED_OUTPATIENT_CLINIC_OR_DEPARTMENT_OTHER): Payer: Medicare Other

## 2023-10-29 ENCOUNTER — Inpatient Hospital Stay (HOSPITAL_BASED_OUTPATIENT_CLINIC_OR_DEPARTMENT_OTHER): Admission: RE | Admit: 2023-10-29 | Payer: Medicare Other | Source: Ambulatory Visit

## 2023-11-06 ENCOUNTER — Encounter: Payer: Self-pay | Admitting: Family Medicine

## 2023-11-11 ENCOUNTER — Other Ambulatory Visit (HOSPITAL_BASED_OUTPATIENT_CLINIC_OR_DEPARTMENT_OTHER)

## 2023-11-11 ENCOUNTER — Inpatient Hospital Stay (HOSPITAL_BASED_OUTPATIENT_CLINIC_OR_DEPARTMENT_OTHER): Admission: RE | Admit: 2023-11-11 | Source: Ambulatory Visit

## 2023-11-18 ENCOUNTER — Ambulatory Visit (HOSPITAL_BASED_OUTPATIENT_CLINIC_OR_DEPARTMENT_OTHER)
Admission: RE | Admit: 2023-11-18 | Discharge: 2023-11-18 | Disposition: A | Discharge status: Home / Self Care | Source: Ambulatory Visit | Attending: Family Medicine | Admitting: Family Medicine

## 2023-11-18 ENCOUNTER — Encounter (HOSPITAL_BASED_OUTPATIENT_CLINIC_OR_DEPARTMENT_OTHER): Payer: Self-pay

## 2023-11-18 DIAGNOSIS — Z1231 Encounter for screening mammogram for malignant neoplasm of breast: Secondary | ICD-10-CM | POA: Diagnosis not present

## 2023-11-18 DIAGNOSIS — Z139 Encounter for screening, unspecified: Secondary | ICD-10-CM

## 2023-12-05 ENCOUNTER — Other Ambulatory Visit: Payer: Self-pay | Admitting: Family Medicine

## 2023-12-05 DIAGNOSIS — I10 Essential (primary) hypertension: Secondary | ICD-10-CM

## 2023-12-16 DIAGNOSIS — Z85820 Personal history of malignant melanoma of skin: Secondary | ICD-10-CM | POA: Diagnosis not present

## 2023-12-16 DIAGNOSIS — L814 Other melanin hyperpigmentation: Secondary | ICD-10-CM | POA: Diagnosis not present

## 2023-12-16 DIAGNOSIS — D1801 Hemangioma of skin and subcutaneous tissue: Secondary | ICD-10-CM | POA: Diagnosis not present

## 2023-12-16 DIAGNOSIS — Z08 Encounter for follow-up examination after completed treatment for malignant neoplasm: Secondary | ICD-10-CM | POA: Diagnosis not present

## 2023-12-16 DIAGNOSIS — L817 Pigmented purpuric dermatosis: Secondary | ICD-10-CM | POA: Diagnosis not present

## 2023-12-16 DIAGNOSIS — L821 Other seborrheic keratosis: Secondary | ICD-10-CM | POA: Diagnosis not present

## 2023-12-28 ENCOUNTER — Other Ambulatory Visit: Payer: Self-pay | Admitting: Family Medicine

## 2023-12-28 DIAGNOSIS — I10 Essential (primary) hypertension: Secondary | ICD-10-CM

## 2024-01-01 ENCOUNTER — Other Ambulatory Visit: Payer: Self-pay | Admitting: Family Medicine

## 2024-01-01 DIAGNOSIS — I10 Essential (primary) hypertension: Secondary | ICD-10-CM

## 2024-01-05 ENCOUNTER — Other Ambulatory Visit: Payer: Self-pay | Admitting: Family Medicine

## 2024-01-05 DIAGNOSIS — E785 Hyperlipidemia, unspecified: Secondary | ICD-10-CM

## 2024-01-26 ENCOUNTER — Encounter: Payer: Self-pay | Admitting: Family Medicine

## 2024-02-09 ENCOUNTER — Ambulatory Visit (INDEPENDENT_AMBULATORY_CARE_PROVIDER_SITE_OTHER): Admitting: Family Medicine

## 2024-02-09 ENCOUNTER — Encounter: Payer: Self-pay | Admitting: Family Medicine

## 2024-02-09 VITALS — BP 118/70 | HR 75 | Temp 98.3°F | Resp 18 | Ht 64.0 in | Wt 193.6 lb

## 2024-02-09 DIAGNOSIS — E785 Hyperlipidemia, unspecified: Secondary | ICD-10-CM

## 2024-02-09 DIAGNOSIS — I10 Essential (primary) hypertension: Secondary | ICD-10-CM

## 2024-02-09 LAB — COMPREHENSIVE METABOLIC PANEL WITH GFR
ALT: 17 U/L (ref 0–35)
AST: 17 U/L (ref 0–37)
Albumin: 4.5 g/dL (ref 3.5–5.2)
Alkaline Phosphatase: 75 U/L (ref 39–117)
BUN: 14 mg/dL (ref 6–23)
CO2: 31 meq/L (ref 19–32)
Calcium: 9.7 mg/dL (ref 8.4–10.5)
Chloride: 97 meq/L (ref 96–112)
Creatinine, Ser: 0.76 mg/dL (ref 0.40–1.20)
GFR: 77.88 mL/min (ref >60.00)
Glucose, Bld: 96 mg/dL (ref 70–99)
Potassium: 4.4 meq/L (ref 3.5–5.1)
Sodium: 136 meq/L (ref 135–145)
Total Bilirubin: 0.7 mg/dL (ref 0.2–1.2)
Total Protein: 6.4 g/dL (ref 6.0–8.3)

## 2024-02-09 LAB — LIPID PANEL
Cholesterol: 160 mg/dL (ref 0–200)
HDL: 67.1 mg/dL (ref >39.00)
LDL Cholesterol: 66 mg/dL (ref 0–99)
NonHDL: 92.81
Total CHOL/HDL Ratio: 2
Triglycerides: 135 mg/dL (ref 0.0–149.0)
VLDL: 27 mg/dL (ref 0.0–40.0)

## 2024-02-09 LAB — CBC WITH DIFFERENTIAL/PLATELET
Basophils Absolute: 0 K/uL (ref 0.0–0.1)
Basophils Relative: 0.4 % (ref 0.0–3.0)
Eosinophils Absolute: 0.1 K/uL (ref 0.0–0.7)
Eosinophils Relative: 1.1 % (ref 0.0–5.0)
HCT: 40.2 % (ref 36.0–46.0)
Hemoglobin: 13.7 g/dL (ref 12.0–15.0)
Lymphocytes Relative: 21.2 % (ref 12.0–46.0)
Lymphs Abs: 1.2 K/uL (ref 0.7–4.0)
MCHC: 34.1 g/dL (ref 30.0–36.0)
MCV: 87.6 fl (ref 78.0–100.0)
Monocytes Absolute: 0.5 K/uL (ref 0.1–1.0)
Monocytes Relative: 8.7 % (ref 3.0–12.0)
Neutro Abs: 3.9 K/uL (ref 1.4–7.7)
Neutrophils Relative %: 68.6 % (ref 43.0–77.0)
Platelets: 188 K/uL (ref 150.0–400.0)
RBC: 4.59 Mil/uL (ref 3.87–5.11)
RDW: 13.5 % (ref 11.5–15.5)
WBC: 5.7 K/uL (ref 4.0–10.5)

## 2024-02-09 MED ORDER — SIMVASTATIN 40 MG PO TABS: 40.0000 mg | ORAL_TABLET | Freq: Every day | ORAL | 1 refills | Status: AC

## 2024-02-09 MED ORDER — LISINOPRIL-HYDROCHLOROTHIAZIDE 20-25 MG PO TABS: 1.0000 | ORAL_TABLET | Freq: Every day | ORAL | 0 refills | Status: DC

## 2024-02-09 MED ORDER — LISINOPRIL-HYDROCHLOROTHIAZIDE 20-25 MG PO TABS
1.0000 | ORAL_TABLET | Freq: Every day | ORAL | 1 refills | Status: AC
Start: 2024-02-09 — End: ?

## 2024-02-09 NOTE — Assessment & Plan Note (Signed)
 Well controlled, no changes to meds. Encouraged heart healthy diet such as the DASH diet and exercise as tolerated.

## 2024-02-09 NOTE — Progress Notes (Signed)
 Subjective:    Patient ID: Heather Pierce, female    DOB: 10-02-1950, 73 y.o.   MRN: [id number]  Chief Complaint  Patient presents with   Hypertension   Hyperlipidemia   Follow-up    HPI Patient is in today for f/u htn and cholesterol.  Discussed the use of AI scribe software for clinical note transcription with the patient, who gave verbal consent to proceed.  History of Present Illness Heather Pierce is a 73 year old female who presents with concerns about her blood pressure.  She has been monitoring her blood pressure and noted a recent increase to 136, which she believes could be improved. During her last physical in December, her blood pressure was 120/54, which she felt was low. She has been attempting to manage her blood pressure through walking, although she has not been consistent due to the heat.  She is actively involved in volunteering at golf tournaments. She recently walked ten miles at an event, indicating her level of physical activity despite the heat.  A couple of months ago, she experienced a fall while playing volleyball with a friend's daughter, landing on her side but avoiding injury to her knees. She had a bruise under her eyes from her glasses but no significant swelling.  She inquires about the status of the bone density machine, as she has been unable to schedule a bone density test due to the machine being broken.    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   KNEE ARTHROSCOPY Right 11/29/2009    Family History  Problem Relation Age of Onset   Jaundice Father    GER disease Father    COPD Father    Dementia Father    Osteoarthritis Father        In nursing home   Cancer Father        melanoma   Alzheimer's disease Father     Colon polyps Father    Arthritis Father    Alzheimer's disease Mother    Dementia Mother    Arthritis Other    Hyperlipidemia Other    Hypertension Other    Coronary artery disease Other        1st degree relative @60s    Melanoma Other    Colon cancer Maternal Grandmother 68    Social History   Socioeconomic History   Marital status: Single    Spouse name: Not on file   Number of children: Not on file   Years of education: Not on file   Highest education level: Bachelor's degree (e.g., BA, AB, BS)  Occupational History   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    Employer: [employer]  Tobacco Use   Smoking status: Never   Smokeless tobacco: Never  Substance and Sexual Activity   Alcohol use: Yes    Comment: Social   Drug use: No   Sexual activity: Not Currently    Partners: Male    Birth control/protection: Abstinence, Post-menopausal  Other Topics Concern   Not on file  Social History Narrative   Youth worker   Social Drivers of Health   Financial Resource Strain: Low Risk  (02/06/2024)   Overall Financial Resource Strain (CARDIA)    Difficulty  of Paying Living Expenses: Not hard at all  Food Insecurity: No Food Insecurity (02/06/2024)   Hunger Vital Sign    Worried About Running Out of Food in the Last Year: Never true    Ran Out of Food in the Last Year: Never true  Transportation Needs: No Transportation Needs (02/06/2024)   PRAPARE - Administrator, Civil Service (Medical): No    Lack of Transportation (Non-Medical): No  Physical Activity: Insufficiently Active (02/06/2024)   Exercise Vital Sign    Days of Exercise per Week: 3 days    Minutes of Exercise per Session: 30 min  Stress: No Stress Concern Present (02/06/2024)   [employer] of Occupational Health - Occupational Stress Questionnaire    Feeling of Stress: Only a little  Social Connections: Unknown (02/06/2024)   Social Connection and Isolation Panel    Frequency of  Communication with Friends and Family: Patient declined    Frequency of Social Gatherings with Friends and Family: Once a week    Attends Religious Services: Patient declined    Database administrator or Organizations: No    Attends Engineer, structural: Not on file    Marital Status: Never married  Intimate Partner Violence: Not At Risk (06/05/2023)   Humiliation, Afraid, Rape, and Kick questionnaire    Fear of Current or Ex-Partner: No    Emotionally Abused: No    Physically Abused: No    Sexually Abused: No    Outpatient Medications Prior to Visit  Medication Sig Dispense Refill   ibuprofen (ADVIL) 800 MG tablet Take 400 mg by mouth 2 (two) times daily as needed.     lisinopril -hydrochlorothiazide  (ZESTORETIC ) 20-25 MG tablet Take 1 tablet by mouth daily. 30 tablet 0   simvastatin  (ZOCOR ) 40 MG tablet TAKE 1 TABLET BY MOUTH EVERY DAY 90 tablet 1   No facility-administered medications prior to visit.    No Known Allergies  Review of Systems  Constitutional:  Negative for chills, fever and malaise/fatigue.  HENT:  Negative for congestion and hearing loss.   Eyes:  Negative for discharge.  Respiratory:  Negative for cough, sputum production and shortness of breath.   Cardiovascular:  Negative for chest pain, palpitations and leg swelling.  Gastrointestinal:  Negative for abdominal pain, blood in stool, constipation, diarrhea, heartburn, nausea and vomiting.  Genitourinary:  Negative for dysuria, frequency, hematuria and urgency.  Musculoskeletal:  Negative for back pain, falls and myalgias.  Skin:  Negative for rash.  Neurological:  Negative for dizziness, sensory change, loss of consciousness, weakness and headaches.  Endo/Heme/Allergies:  Negative for environmental allergies. Does not bruise/bleed easily.  Psychiatric/Behavioral:  Negative for depression and suicidal ideas. The patient is not nervous/anxious and does not have insomnia.        Objective:     Physical Exam Vitals and nursing note reviewed.  Constitutional:      General: She is not in acute distress.    Appearance: Normal appearance. She is well-developed.  HENT:     Head: Normocephalic and atraumatic.  Eyes:     General: No scleral icterus.       Right eye: No discharge.        Left eye: No discharge.  Cardiovascular:     Rate and Rhythm: Normal rate and regular rhythm.     Heart sounds: No murmur heard. Pulmonary:     Effort: Pulmonary effort is normal. No respiratory distress.     Breath sounds: Normal breath sounds.  Musculoskeletal:        General: Normal range of motion.     Cervical back: Normal range of motion and neck supple.     Right lower leg: No edema.     Left lower leg: No edema.  Skin:    General: Skin is warm and dry.  Neurological:     Mental Status: She is alert and oriented to person, place, and time.  Psychiatric:        Mood and Affect: Mood normal.        Behavior: Behavior normal.        Thought Content: Thought content normal.        Judgment: Judgment normal.     BP 118/70   Pulse 75   Temp 98.3 F (36.8 C) (Oral)   Resp 18   Ht 5' 4 (1.626 m)   Wt 193 lb 9.6 oz (87.8 kg)   LMP 09/24/2005   SpO2 97%   BMI 33.23 kg/m  Wt Readings from Last 3 Encounters:  02/09/24 193 lb 9.6 oz (87.8 kg)  06/05/23 194 lb 9.6 oz (88.3 kg)  06/05/23 194 lb 9.6 oz (88.3 kg)    Diabetic Foot Exam - Simple   No data filed    Lab Results  Component Value Date   WBC 6.9 06/05/2023   HGB 12.8 06/05/2023   HCT 36.8 06/05/2023   PLT 193.0 06/05/2023   GLUCOSE 93 06/05/2023   CHOL 163 06/05/2023   TRIG 137.0 06/05/2023   HDL 57.30 06/05/2023   LDLDIRECT 148.1 04/18/2009   LDLCALC 78 06/05/2023   ALT 13 06/05/2023   AST 15 06/05/2023   NA 138 06/05/2023   K 3.8 06/05/2023   CL 100 06/05/2023   CREATININE 0.81 06/05/2023   BUN 24 (H) 06/05/2023   CO2 30 06/05/2023   TSH 0.93 06/05/2023    Lab Results  Component Value Date   TSH  0.93 06/05/2023   Lab Results  Component Value Date   WBC 6.9 06/05/2023   HGB 12.8 06/05/2023   HCT 36.8 06/05/2023   MCV 88.3 06/05/2023   PLT 193.0 06/05/2023   Lab Results  Component Value Date   NA 138 06/05/2023   K 3.8 06/05/2023   CO2 30 06/05/2023   GLUCOSE 93 06/05/2023   BUN 24 (H) 06/05/2023   CREATININE 0.81 06/05/2023   BILITOT 0.6 06/05/2023   ALKPHOS 74 06/05/2023   AST 15 06/05/2023   ALT 13 06/05/2023   PROT 6.7 06/05/2023   ALBUMIN 4.5 06/05/2023   CALCIUM 9.6 06/05/2023   GFR 72.49 06/05/2023   Lab Results  Component Value Date   CHOL 163 06/05/2023   Lab Results  Component Value Date   HDL 57.30 06/05/2023   Lab Results  Component Value Date   LDLCALC 78 06/05/2023   Lab Results  Component Value Date   TRIG 137.0 06/05/2023   Lab Results  Component Value Date   CHOLHDL 3 06/05/2023   No results found for: HGBA1C     Assessment & Plan:  Hyperlipidemia, unspecified hyperlipidemia type Assessment & Plan: Encourage heart healthy diet such as MIND or DASH diet, increase exercise, avoid trans fats, simple carbohydrates and processed foods, consider a krill or fish or flaxseed oil cap daily.     Essential hypertension Assessment & Plan: Well controlled, no changes to meds. Encouraged heart healthy diet such as the DASH diet and exercise as tolerated.    Orders: -  CBC with Differential/Platelet -     Lisinopril -hydroCHLOROthiazide ; Take 1 tablet by mouth daily.  Dispense: 90 tablet; Refill: 1  Hyperlipidemia LDL goal <100 -     Simvastatin ; Take 1 tablet (40 mg total) by mouth daily.  Dispense: 90 tablet; Refill: 1 -     Comprehensive metabolic panel with GFR -     Lipid panel  Assessment and Plan Assessment & Plan Hypertension   Blood pressure is 136, which could be improved. Exercise has been inconsistent due to the heat. No acute hypertension issues are present. Recheck blood pressure after sitting for a  while.    Janiya Millirons R Lowne Chase, DO

## 2024-02-09 NOTE — Assessment & Plan Note (Signed)
 Encourage heart healthy diet such as MIND or DASH diet, increase exercise, avoid trans fats, simple carbohydrates and processed foods, consider a krill or fish or flaxseed oil cap daily.

## 2024-02-10 ENCOUNTER — Ambulatory Visit: Payer: Self-pay | Admitting: Family Medicine

## 2024-03-09 ENCOUNTER — Ambulatory Visit (HOSPITAL_BASED_OUTPATIENT_CLINIC_OR_DEPARTMENT_OTHER)
Admission: RE | Admit: 2024-03-09 | Discharge: 2024-03-09 | Disposition: A | Discharge status: Home / Self Care | Source: Ambulatory Visit | Attending: Family Medicine | Admitting: Family Medicine

## 2024-03-09 ENCOUNTER — Ambulatory Visit: Payer: Self-pay | Admitting: Family Medicine

## 2024-03-09 DIAGNOSIS — E2839 Other primary ovarian failure: Secondary | ICD-10-CM | POA: Diagnosis present

## 2024-03-09 DIAGNOSIS — Z780 Asymptomatic menopausal state: Secondary | ICD-10-CM | POA: Diagnosis not present

## 2024-06-15 ENCOUNTER — Encounter: Payer: Self-pay | Admitting: Family Medicine

## 2024-06-15 ENCOUNTER — Ambulatory Visit (INDEPENDENT_AMBULATORY_CARE_PROVIDER_SITE_OTHER): Admitting: *Deleted

## 2024-06-15 ENCOUNTER — Ambulatory Visit: Admitting: Family Medicine

## 2024-06-15 VITALS — BP 120/78 | HR 98 | Temp 98.9°F | Resp 18 | Ht 64.0 in | Wt 205.4 lb

## 2024-06-15 VITALS — BP 120/78 | HR 98 | Temp 98.9°F | Resp 18 | Ht 64.0 in | Wt 205.0 lb

## 2024-06-15 DIAGNOSIS — Z23 Encounter for immunization: Secondary | ICD-10-CM

## 2024-06-15 DIAGNOSIS — Z1211 Encounter for screening for malignant neoplasm of colon: Secondary | ICD-10-CM

## 2024-06-15 DIAGNOSIS — Z0000 Encounter for general adult medical examination without abnormal findings: Secondary | ICD-10-CM

## 2024-06-15 DIAGNOSIS — I10 Essential (primary) hypertension: Secondary | ICD-10-CM | POA: Diagnosis not present

## 2024-06-15 DIAGNOSIS — Z1231 Encounter for screening mammogram for malignant neoplasm of breast: Secondary | ICD-10-CM

## 2024-06-15 DIAGNOSIS — E785 Hyperlipidemia, unspecified: Secondary | ICD-10-CM

## 2024-06-15 LAB — CBC WITH DIFFERENTIAL/PLATELET
Basophils Absolute: 0 K/uL (ref 0.0–0.1)
Basophils Relative: 0.5 % (ref 0.0–3.0)
Eosinophils Absolute: 0.1 K/uL (ref 0.0–0.7)
Eosinophils Relative: 0.8 % (ref 0.0–5.0)
HCT: 40.6 % (ref 36.0–46.0)
Hemoglobin: 13.7 g/dL (ref 12.0–15.0)
Lymphocytes Relative: 14.7 % (ref 12.0–46.0)
Lymphs Abs: 1.2 K/uL (ref 0.7–4.0)
MCHC: 33.6 g/dL (ref 30.0–36.0)
MCV: 88 fl (ref 78.0–100.0)
Monocytes Absolute: 0.8 K/uL (ref 0.1–1.0)
Monocytes Relative: 9.9 % (ref 3.0–12.0)
Neutro Abs: 6 K/uL (ref 1.4–7.7)
Neutrophils Relative %: 74.1 % (ref 43.0–77.0)
Platelets: 197 K/uL (ref 150.0–400.0)
RBC: 4.62 Mil/uL (ref 3.87–5.11)
RDW: 12.9 % (ref 11.5–15.5)
WBC: 8.1 K/uL (ref 4.0–10.5)

## 2024-06-15 LAB — TSH: TSH: 1.32 u[IU]/mL (ref 0.35–5.50)

## 2024-06-15 LAB — COMPREHENSIVE METABOLIC PANEL WITH GFR
ALT: 18 U/L (ref 3–35)
AST: 19 U/L (ref 5–37)
Albumin: 4.7 g/dL (ref 3.5–5.2)
Alkaline Phosphatase: 72 U/L (ref 39–117)
BUN: 17 mg/dL (ref 6–23)
CO2: 31 meq/L (ref 19–32)
Calcium: 9.7 mg/dL (ref 8.4–10.5)
Chloride: 97 meq/L (ref 96–112)
Creatinine, Ser: 0.79 mg/dL (ref 0.40–1.20)
GFR: 74.16 mL/min (ref >60.00)
Glucose, Bld: 72 mg/dL (ref 70–99)
Potassium: 4.1 meq/L (ref 3.5–5.1)
Sodium: 135 meq/L (ref 135–145)
Total Bilirubin: 0.6 mg/dL (ref 0.2–1.2)
Total Protein: 6.8 g/dL (ref 6.0–8.3)

## 2024-06-15 LAB — LIPID PANEL
Cholesterol: 163 mg/dL (ref 28–200)
HDL: 76.6 mg/dL (ref >39.00)
LDL Cholesterol: 56 mg/dL (ref 10–99)
NonHDL: 86.3
Total CHOL/HDL Ratio: 2
Triglycerides: 150 mg/dL — ABNORMAL HIGH (ref 10.0–149.0)
VLDL: 30 mg/dL (ref 0.0–40.0)

## 2024-06-15 NOTE — Patient Instructions (Addendum)
 Heather Pierce,  Thank you for taking the time for your Medicare Wellness Visit. I appreciate your continued commitment to your health goals. Please review the care plan we discussed, and feel free to reach out if I can assist you further.  Please note that Annual Wellness Visits do not include a physical exam. Some assessments may be limited, especially if the visit was conducted virtually. If needed, we may recommend an in-person follow-up with your provider.  GOAL:  Increase activity by walking  Ongoing Care Seeing your primary care provider every 3 to 6 months helps us  monitor your health and provide consistent, personalized care.   Dr Antonio Meth:  today Medicare AWV: 06/16/25 9am, in person  Referrals If a referral was made during today's visit and you haven't received any updates within two weeks, please contact the referred provider directly to check on the status.  Mammogram Manufacturing Engineer [place]): [phone] Colonoscopy ([facility]):  [phone]  Recommended Screenings:  You will need to get the following vaccines at your local pharmacy: Tetanus, Covid  Health Maintenance  Topic Date Due   DTaP/Tdap/Td vaccine (2 - Tdap) 04/19/2019   Colon Cancer Screening  01/16/2023   COVID-19 Vaccine (8 - 2025-26 season) 03/01/2024   Medicare Annual Wellness Visit  06/04/2024   Zoster (Shingles) Vaccine (1 of 2) 06/15/2025*   Breast Cancer Screening  11/17/2024   Osteoporosis screening with Bone Density Scan  03/09/2026   Pneumococcal Vaccine for age over 2  Completed   Flu Shot  Completed   Hepatitis C Screening  Completed   Meningitis B Vaccine  Aged Out  *Topic was postponed. The date shown is not the original due date.       06/08/2024    9:23 AM  Advanced Directives  Does Patient Have a Medical Advance Directive? Yes  Type of Estate Agent of [place];Living will  Does patient want to make changes to medical advance directive? No - Patient  declined  Copy of Healthcare Power of Attorney in Chart? Yes - validated most recent copy scanned in chart (See row information)    Vision: Annual vision screenings are recommended for early detection of glaucoma, cataracts, and diabetic retinopathy. These exams can also reveal signs of chronic conditions such as diabetes and high blood pressure.  Dental: Annual dental screenings help detect early signs of oral cancer, gum disease, and other conditions linked to overall health, including heart disease and diabetes.  Please see the attached documents for additional preventive care recommendations.

## 2024-06-15 NOTE — Assessment & Plan Note (Signed)
 Well controlled, no changes to meds. Encouraged heart healthy diet such as the DASH diet and exercise as tolerated.

## 2024-06-15 NOTE — Assessment & Plan Note (Signed)
 Ghm utd Check labs See AVS Health Maintenance  Topic Date Due   DTaP/Tdap/Td (2 - Tdap) 04/19/2019   Colonoscopy  01/16/2023   COVID-19 Vaccine (8 - 2025-26 season) 07/01/2024 (Originally 03/01/2024)   Zoster Vaccines- Shingrix (1 of 2) 06/15/2025 (Originally 12/13/1969)   Mammogram  11/17/2024   Medicare Annual Wellness (AWV)  06/15/2025   Bone Density Scan  03/09/2026   Pneumococcal Vaccine: 50+ Years  Completed   Influenza Vaccine  Completed   Hepatitis C Screening  Completed   Meningococcal B Vaccine  Aged Out

## 2024-06-15 NOTE — Progress Notes (Signed)
 Chief Complaint  Patient presents with   Medicare Wellness     Subjective:   Heather Pierce is a 73 y.o. female who presents for a Medicare Annual Wellness Visit.  Visit info / Clinical Intake: Medicare Wellness Visit Type:: Subsequent Annual Wellness Visit Persons participating in visit and providing information:: patient Medicare Wellness Visit Mode:: In-person (required for WTM) Interpreter Needed?: No Pre-visit prep was completed: yes AWV questionnaire completed by patient prior to visit?: yes Date:: 06/08/24 Living arrangements:: (!) (Patient-Rptd) lives alone Patient's Overall Health Status Rating: (Patient-Rptd) very good Typical amount of pain: (Patient-Rptd) some Does pain affect daily life?: (Patient-Rptd) no Are you currently prescribed opioids?: no  Dietary Habits and Nutritional Risks How many meals a day?: (Patient-Rptd) 2 Eats fruit and vegetables daily?: (!) (Patient-Rptd) no Most meals are obtained by: (Patient-Rptd) preparing own meals; eating out In the last 2 weeks, have you had any of the following?: none Diabetic:: no  Functional Status Activities of Daily Living (to include ambulation/medication): (Patient-Rptd) Independent Ambulation: (Patient-Rptd) Independent Medication Administration: (Patient-Rptd) Independent Home Management (perform basic housework or laundry): (Patient-Rptd) Independent Manage your own finances?: (Patient-Rptd) yes Primary transportation is: (Patient-Rptd) driving Concerns about vision?: no *vision screening is required for WTM* (up to date with the [facility]) Concerns about hearing?: no  Fall Screening Falls in the past year?: (Patient-Rptd) 0 Number of falls in past year: 0 Was there an injury with Fall?: 0 Fall Risk Category Calculator: 0 Patient Fall Risk Level: Low Fall Risk  Fall Risk Patient at Risk for Falls Due to: Orthopedic patient Fall risk Follow up: Falls evaluation completed  Home and  Transportation Safety: All rugs have non-skid backing?: (Patient-Rptd) yes All stairs or steps have railings?: (Patient-Rptd) yes Grab bars in the bathtub or shower?: (Patient-Rptd) yes Have non-skid surface in bathtub or shower?: (Patient-Rptd) yes Good home lighting?: (Patient-Rptd) yes Regular seat belt use?: (Patient-Rptd) yes Hospital stays in the last year:: (Patient-Rptd) no  Cognitive Assessment Difficulty concentrating, remembering, or making decisions? : (Patient-Rptd) no Will 6CIT or Mini Cog be Completed: yes What year is it?: 0 points What month is it?: 0 points Give patient an address phrase to remember (5 components): [address], [place]  About what time is it?: 0 points Count backwards from 20 to 1: 0 points Say the months of the year in reverse: 0 points Repeat the address phrase from earlier: 0 points 6 CIT Score: 0 points  Advance Directives (For Healthcare) Does Patient Have a Medical Advance Directive?: Yes Does patient want to make changes to medical advance directive?: No - Patient declined Type of Advance Directive: Healthcare Power of [place]; Living will Copy of Healthcare Power of Attorney in Chart?: Yes - validated most recent copy scanned in chart (See row information) Copy of Living Will in Chart?: Yes - validated most recent copy scanned in chart (See row information)  Reviewed/Updated  Reviewed/Updated: Reviewed All (Medical, Surgical, Family, Medications, Allergies, Care Teams, Patient Goals)    Allergies (verified) Patient has no known allergies.   Current Medications (verified) Outpatient Encounter Medications as of 06/15/2024  Medication Sig   lisinopril -hydrochlorothiazide  (ZESTORETIC ) 20-25 MG tablet Take 1 tablet by mouth daily.   simvastatin  (ZOCOR ) 40 MG tablet Take 1 tablet (40 mg total) by mouth daily.   [DISCONTINUED] ibuprofen (ADVIL) 800 MG tablet Take 400 mg by mouth 2 (two) times daily as needed.   No  facility-administered encounter medications on file as of 06/15/2024.    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   KNEE ARTHROSCOPY Right 11/29/2009   Family History  Problem Relation Age of Onset   Jaundice Father    GER disease Father    COPD Father    Dementia Father    Osteoarthritis Father        In nursing home   Cancer Father        melanoma   Alzheimer's disease Father    Colon polyps Father    Arthritis Father    Alzheimer's disease Mother    Dementia Mother    Arthritis Other    Hyperlipidemia Other    Hypertension Other    Coronary artery disease Other        1st degree relative @60s    Melanoma Other    Colon cancer Maternal Grandmother 14   Social History   Occupational History   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    Employer: [employer]  Tobacco Use   Smoking status: Never   Smokeless tobacco: Never  Substance and Sexual Activity   Alcohol use: Yes    Comment: Social   Drug use: No   Sexual activity: Not Currently    Partners: Male    Birth control/protection: Abstinence, Post-menopausal   Tobacco Counseling Counseling given: Not Answered  SDOH Screenings   Food Insecurity: No Food Insecurity (06/08/2024)  Housing: Unknown (06/08/2024)  Transportation Needs: No Transportation Needs (06/08/2024)  Utilities: Not At Risk (06/15/2024)  Alcohol Screen: Low Risk (06/08/2024)  Depression (PHQ2-9): Low Risk (06/15/2024)  Financial Resource Strain: Low Risk (06/08/2024)  Physical Activity: Insufficiently Active (06/08/2024)  Social Connections: Socially Isolated (06/08/2024)  Stress: No Stress Concern Present (06/08/2024)  Tobacco Use: Low Risk (06/15/2024)  Health Literacy: Adequate Health Literacy (06/05/2023)   See  flowsheets for full screening details  Depression Screen PHQ 2 & 9 Depression Scale- Over the past 2 weeks, how often have you been bothered by any of the following problems? Little interest or pleasure in doing things: 0 Feeling down, depressed, or hopeless (PHQ Adolescent also includes...irritable): 0 (lost mother and grandmother the week of Christmas in years past and recently lost a friend this month.) PHQ-2 Total Score: 0 Trouble falling or staying asleep, or sleeping too much: 1 (2 nights a week may toss and turn more) Feeling tired or having little energy: 0 Poor appetite or overeating (PHQ Adolescent also includes...weight loss): 0 Feeling bad about yourself - or that you are a failure or have let yourself or your family down: 0 Trouble concentrating on things, such as reading the newspaper or watching television (PHQ Adolescent also includes...like school work): 0 Moving or speaking so slowly that other people could have noticed. Or the opposite - being so fidgety or restless that you have been moving around a lot more than usual: 0 Thoughts that you would be better off dead, or of hurting yourself in some way: 0 PHQ-9 Total Score: 1 If you checked off any problems, how difficult have these problems made it for you to do your work, take care of things at home, or get along with other people?: Not difficult at all  Depression Treatment Depression Interventions/Treatment : EYV7-0 Score <4 Follow-up Not Indicated     Goals Addressed  This Visit's Progress    Patient Stated   Not on track    Increase activity by walking             Objective:    Today's Vitals   06/15/24 0853 06/15/24 0937  BP: (!) 148/70 120/78  Pulse: 98   Resp: 18   Temp: 98.9 F (37.2 C)   TempSrc: Oral   SpO2: 98%   Weight: 205 lb 6.4 oz (93.2 kg)   Height: 5' 4 (1.626 m)    Body mass index is 35.26 kg/m.  Hearing/Vision screen No results found. Immunizations and Health  Maintenance Health Maintenance  Topic Date Due   DTaP/Tdap/Td (2 - Tdap) 04/19/2019   Colonoscopy  01/16/2023   COVID-19 Vaccine (8 - 2025-26 season) 03/01/2024   Zoster Vaccines- Shingrix (1 of 2) 06/15/2025 (Originally [birth year]/1969)   Mammogram  11/17/2024   Medicare Annual Wellness (AWV)  06/15/2025   Bone Density Scan  03/09/2026   Pneumococcal Vaccine: 50+ Years  Completed   Influenza Vaccine  Completed   Hepatitis C Screening  Completed   Meningococcal B Vaccine  Aged Out        Assessment/Plan:  This is a routine wellness examination for [place].  Patient Care Team: Antonio Meth, Jamee SAUNDERS, DO as PCP - General Valdemar Rocky SAUNDERS, NP as Nurse Practitioner (Orthopedic Surgery) [facility]  I have personally reviewed and noted the following in the patients chart:   Medical and social history Use of alcohol, tobacco or illicit drugs  Current medications and supplements including opioid prescriptions. Functional ability and status Nutritional status Physical activity Advanced directives List of other physicians Hospitalizations, surgeries, and ER visits in previous 12 months Vitals Screenings to include cognitive, depression, and falls Referrals and appointments  Orders Placed This Encounter  Procedures   MM 3D SCREENING MAMMOGRAM BILATERAL BREAST    Standing Status:   Future    Expected Date:   11/18/2024    Expiration Date:   06/15/2025    Reason for Exam (SYMPTOM  OR DIAGNOSIS REQUIRED):   breast cancer screening    Preferred imaging location?:   [facility]   Flu vaccine HIGH DOSE PF(Fluzone Trivalent)   Ambulatory referral to Gastroenterology    Referral Priority:   Routine    Referral Type:   Consultation    Referral Reason:   Specialty Services Required    Number of Visits Requested:   1   In addition, I have reviewed and discussed with patient certain preventive protocols, quality metrics, and best practice recommendations. A written personalized  care plan for preventive services as well as general preventive health recommendations were provided to patient.   Lolita Libra, CMA   06/15/2024   Return in 1 year (on 06/15/2025).  After Visit Summary: (In Person-Printed) AVS printed and given to the patient  Nurse Notes: Appointment(s) made: (AWV and keep appt with PCP today) HM Addressed: Vaccines Given today: Flu will get COVID and Tetanus booster at pharmacy. Mammogram ordered Referral sent to GI for colonoscopy

## 2024-06-15 NOTE — Progress Notes (Signed)
 Subjective:    Patient ID: Heather Pierce, female    DOB: 04-Nov-1950, 73 y.o.   MRN: [id number]  Chief Complaint  Patient presents with   Annual Exam    HPI Patient is in today for cpe.  Discussed the use of AI scribe software for clinical note transcription with the patient, who gave verbal consent to proceed.  History of Present Illness Heather Pierce is a 73 year old female who presents for a routine follow-up visit.  She has not been walking recently due to cold weather, which she attributes to a slight weight gain. She plans to resume walking when the weather improves.  She has not received a COVID-19 vaccine this year and is considering it only if she needs to travel by airplane in March. She has received a flu shot recently. She is due for a tetanus shot, which she has not received since 2010.  Her blood pressure medication was recently refilled, and she has one more refill on her Simvastatin , which was refilled a month ago.  She mentions a past heart murmur diagnosis during a previous colonoscopy visit.  She is planning to schedule a colonoscopy next year and is seeking someone to accompany her, as her previous companion has passed away.  She has a family history of lung cancer and dementia. She expresses concern about cancer due to its prevalence among people she knows and is trying to keep her brain active to avoid dementia.    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   KNEE ARTHROSCOPY Right 11/29/2009    Family History  Problem Relation Age of Onset   Jaundice Father    GER disease Father    COPD Father    Dementia Father    Osteoarthritis Father        In nursing home   Cancer Father        melanoma   Alzheimer's disease Father    Colon  polyps Father    Arthritis Father    Alzheimer's disease Mother    Dementia Mother    Arthritis Other    Hyperlipidemia Other    Hypertension Other    Coronary artery disease Other        1st degree relative @60s    Melanoma Other    Colon cancer Maternal Grandmother 10    Social History   Socioeconomic History   Marital status: Single    Spouse name: Not on file   Number of children: Not on file   Years of education: Not on file   Highest education level: Bachelor's degree (e.g., BA, AB, BS)  Occupational History   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    Employer: [employer]  Tobacco Use   Smoking status: Never   Smokeless tobacco: Never  Substance and Sexual Activity   Alcohol use: Yes    Comment: Social   Drug use: No   Sexual activity: Not Currently    Partners: Male    Birth control/protection: Abstinence, Post-menopausal  Other Topics Concern   Not on file  Social History Narrative   Exercise--walking   Social Drivers of Health   Tobacco Use: Low Risk (06/15/2024)   Patient History    Smoking  Tobacco Use: Never    Smokeless Tobacco Use: Never    Passive Exposure: Not on file  Financial Resource Strain: Low Risk (06/08/2024)   Overall Financial Resource Strain (CARDIA)    Difficulty of Paying Living Expenses: Not hard at all  Food Insecurity: No Food Insecurity (06/08/2024)   Epic    Worried About Programme Researcher, Broadcasting/film/video in the Last Year: Never true    Ran Out of Food in the Last Year: Never true  Transportation Needs: No Transportation Needs (06/08/2024)   Epic    Lack of Transportation (Medical): No    Lack of Transportation (Non-Medical): No  Physical Activity: Insufficiently Active (06/08/2024)   Exercise Vital Sign    Days of Exercise per Week: 3 days    Minutes of Exercise per Session: 30 min  Stress: No Stress Concern Present (06/08/2024)   [employer] of Occupational Health - Occupational Stress Questionnaire    Feeling of Stress: Only  a little  Social Connections: Socially Isolated (06/08/2024)   Social Connection and Isolation Panel    Frequency of Communication with Friends and Family: Once a week    Frequency of Social Gatherings with Friends and Family: Once a week    Attends Religious Services: Patient declined    Active Member of Clubs or Organizations: No    Attends Engineer, Structural: Not on file    Marital Status: Never married  Intimate Partner Violence: Not At Risk (06/15/2024)   Epic    Fear of Current or Ex-Partner: No    Emotionally Abused: No    Physically Abused: No    Sexually Abused: No  Depression (PHQ2-9): Low Risk (06/15/2024)   Depression (PHQ2-9)    PHQ-2 Score: 1  Alcohol Screen: Low Risk (06/08/2024)   Alcohol Screen    Last Alcohol Screening Score (AUDIT): 2  Housing: Unknown (06/08/2024)   Epic    Unable to Pay for Housing in the Last Year: No    Number of Times Moved in the Last Year: Not on file    Homeless in the Last Year: No  Utilities: Not At Risk (06/15/2024)   Epic    Threatened with loss of utilities: No  Health Literacy: Adequate Health Literacy (06/05/2023)   B1300 Health Literacy    Frequency of need for help with medical instructions: Never    Outpatient Medications Prior to Visit  Medication Sig Dispense Refill   lisinopril -hydrochlorothiazide  (ZESTORETIC ) 20-25 MG tablet Take 1 tablet by mouth daily. 90 tablet 1   simvastatin  (ZOCOR ) 40 MG tablet Take 1 tablet (40 mg total) by mouth daily. 90 tablet 1   No facility-administered medications prior to visit.    Allergies[1]  Review of Systems  Constitutional:  Negative for chills, fever and malaise/fatigue.  HENT:  Negative for congestion and hearing loss.   Eyes:  Negative for blurred vision and discharge.  Respiratory:  Negative for cough, sputum production and shortness of breath.   Cardiovascular:  Negative for chest pain, palpitations and leg swelling.  Gastrointestinal:  Negative for abdominal  pain, blood in stool, constipation, diarrhea, heartburn, nausea and vomiting.  Genitourinary:  Negative for dysuria, frequency, hematuria and urgency.  Musculoskeletal:  Negative for back pain, falls and myalgias.  Skin:  Negative for rash.  Neurological:  Negative for dizziness, sensory change, loss of consciousness, weakness and headaches.  Endo/Heme/Allergies:  Negative for environmental allergies. Does not bruise/bleed easily.  Psychiatric/Behavioral:  Negative for depression and suicidal ideas. The patient is not nervous/anxious and  does not have insomnia.        Objective:    Physical Exam Vitals and nursing note reviewed.  Constitutional:      General: She is not in acute distress.    Appearance: Normal appearance. She is well-developed.  HENT:     Head: Normocephalic and atraumatic.     Right Ear: Tympanic membrane, ear canal and external ear normal. There is no impacted cerumen.     Left Ear: Tympanic membrane, ear canal and external ear normal. There is no impacted cerumen.     Nose: Nose normal.     Mouth/Throat:     Mouth: Mucous membranes are moist.     Pharynx: Oropharynx is clear. No oropharyngeal exudate or posterior oropharyngeal erythema.  Eyes:     General: No scleral icterus.       Right eye: No discharge.        Left eye: No discharge.     Conjunctiva/sclera: Conjunctivae normal.     Pupils: Pupils are equal, round, and reactive to light.  Neck:     Thyroid : No thyromegaly or thyroid  tenderness.     Vascular: No JVD.  Cardiovascular:     Rate and Rhythm: Normal rate and regular rhythm.     Heart sounds: Normal heart sounds. No murmur heard. Pulmonary:     Effort: Pulmonary effort is normal. No respiratory distress.     Breath sounds: Normal breath sounds.  Abdominal:     General: Bowel sounds are normal. There is no distension.     Palpations: Abdomen is soft. There is no mass.     Tenderness: There is no abdominal tenderness. There is no guarding or  rebound.  Musculoskeletal:        General: Normal range of motion.     Cervical back: Normal range of motion and neck supple.     Right lower leg: No edema.     Left lower leg: No edema.  Lymphadenopathy:     Cervical: No cervical adenopathy.  Skin:    General: Skin is warm and dry.     Findings: No erythema or rash.  Neurological:     Mental Status: She is alert and oriented to person, place, and time.     Cranial Nerves: No cranial nerve deficit.     Deep Tendon Reflexes: Reflexes are normal and symmetric.  Psychiatric:        Mood and Affect: Mood normal.        Behavior: Behavior normal.        Thought Content: Thought content normal.        Judgment: Judgment normal.     BP 120/78 (Cuff Size: Normal)   Pulse 98   Temp 98.9 F (37.2 C) (Oral)   Resp 18   Ht 5' 4 (1.626 m)   Wt 205 lb (93 kg)   LMP 09/24/2005   SpO2 98%   BMI 35.19 kg/m  Wt Readings from Last 3 Encounters:  06/15/24 205 lb (93 kg)  06/15/24 205 lb 6.4 oz (93.2 kg)  02/09/24 193 lb 9.6 oz (87.8 kg)    Diabetic Foot Exam - Simple   No data filed    Lab Results  Component Value Date   WBC 5.7 02/09/2024   HGB 13.7 02/09/2024   HCT 40.2 02/09/2024   PLT 188.0 02/09/2024   GLUCOSE 96 02/09/2024   CHOL 160 02/09/2024   TRIG 135.0 02/09/2024   HDL 67.10 02/09/2024   LDLDIRECT 148.1 04/18/2009  LDLCALC 66 02/09/2024   ALT 17 02/09/2024   AST 17 02/09/2024   NA 136 02/09/2024   K 4.4 02/09/2024   CL 97 02/09/2024   CREATININE 0.76 02/09/2024   BUN 14 02/09/2024   CO2 31 02/09/2024   TSH 0.93 06/05/2023    Lab Results  Component Value Date   TSH 0.93 06/05/2023   Lab Results  Component Value Date   WBC 5.7 02/09/2024   HGB 13.7 02/09/2024   HCT 40.2 02/09/2024   MCV 87.6 02/09/2024   PLT 188.0 02/09/2024   Lab Results  Component Value Date   NA 136 02/09/2024   K 4.4 02/09/2024   CO2 31 02/09/2024   GLUCOSE 96 02/09/2024   BUN 14 02/09/2024   CREATININE 0.76 02/09/2024    BILITOT 0.7 02/09/2024   ALKPHOS 75 02/09/2024   AST 17 02/09/2024   ALT 17 02/09/2024   PROT 6.4 02/09/2024   ALBUMIN 4.5 02/09/2024   CALCIUM 9.7 02/09/2024   GFR 77.88 02/09/2024   Lab Results  Component Value Date   CHOL 160 02/09/2024   Lab Results  Component Value Date   HDL 67.10 02/09/2024   Lab Results  Component Value Date   LDLCALC 66 02/09/2024   Lab Results  Component Value Date   TRIG 135.0 02/09/2024   Lab Results  Component Value Date   CHOLHDL 2 02/09/2024   No results found for: HGBA1C     Assessment & Plan:  Preventative health care Assessment & Plan: Ghm utd Check labs See AVS Health Maintenance  Topic Date Due   DTaP/Tdap/Td (2 - Tdap) 04/19/2019   Colonoscopy  01/16/2023   COVID-19 Vaccine (8 - 2025-26 season) 07/01/2024 (Originally 03/01/2024)   Zoster Vaccines- Shingrix (1 of 2) 06/15/2025 (Originally 12/13/1969)   Mammogram  11/17/2024   Medicare Annual Wellness (AWV)  06/15/2025   Bone Density Scan  03/09/2026   Pneumococcal Vaccine: 50+ Years  Completed   Influenza Vaccine  Completed   Hepatitis C Screening  Completed   Meningococcal B Vaccine  Aged Out      Essential hypertension Assessment & Plan: Well controlled, no changes to meds. Encouraged heart healthy diet such as the DASH diet and exercise as tolerated.    Orders: -     CBC with Differential/Platelet -     Comprehensive metabolic panel with GFR -     Lipid panel -     TSH  Hyperlipidemia LDL goal <100 -     Comprehensive metabolic panel with GFR -     Lipid panel  Hyperlipidemia, unspecified hyperlipidemia type Assessment & Plan: Tolerating statin, encouraged heart healthy diet, avoid trans fats, minimize simple carbs and saturated fats. Increase exercise as tolerated     Assessment and Plan Assessment & Plan Adult Wellness Visit   Routine wellness visit shows no significant changes in family history. She has not received a tetanus shot in ten years  and has not received a COVID-19 vaccine this year. Plans to resume walking as the weather improves. Administer tetanus shot at pharmacy or clinic. Encourage resumption of walking as weather permits.  Essential hypertension   Blood pressure management is ongoing with a recent refill of medication.  Hyperlipidemia   Managed with Simvascan, with a refill available.  Heart murmur   Previously identified heart murmur is quiet and not concerning at this time.                                                                                                                                                            +  Ahmere Hemenway R Lowne Chase, DO    [1] No Known Allergies

## 2024-06-15 NOTE — Assessment & Plan Note (Signed)
 Tolerating statin, encouraged heart healthy diet, avoid trans fats, minimize simple carbs and saturated fats. Increase exercise as tolerated

## 2024-06-21 ENCOUNTER — Ambulatory Visit: Payer: Self-pay | Admitting: Family Medicine

## 2024-08-03 ENCOUNTER — Encounter: Payer: Self-pay | Admitting: Gastroenterology

## 2024-12-14 ENCOUNTER — Ambulatory Visit: Admitting: Family Medicine

## 2025-06-16 ENCOUNTER — Ambulatory Visit
